# Patient Record
Sex: Male | Born: 1935
Health system: Southern US, Community
[De-identification: ages and names within clinical notes are randomized; demographics above are authoritative.]

## PROBLEM LIST (undated history)

## (undated) DIAGNOSIS — E785 Hyperlipidemia, unspecified: Secondary | ICD-10-CM

## (undated) DIAGNOSIS — K219 Gastro-esophageal reflux disease without esophagitis: Secondary | ICD-10-CM

## (undated) DIAGNOSIS — Z87898 Personal history of other specified conditions: Secondary | ICD-10-CM

## (undated) DIAGNOSIS — S42009A Fracture of unspecified part of unspecified clavicle, initial encounter for closed fracture: Secondary | ICD-10-CM

## (undated) DIAGNOSIS — N529 Male erectile dysfunction, unspecified: Secondary | ICD-10-CM

## (undated) DIAGNOSIS — G459 Transient cerebral ischemic attack, unspecified: Secondary | ICD-10-CM

## (undated) DIAGNOSIS — D696 Thrombocytopenia, unspecified: Secondary | ICD-10-CM

## (undated) DIAGNOSIS — I519 Heart disease, unspecified: Secondary | ICD-10-CM

## (undated) DIAGNOSIS — Z85528 Personal history of other malignant neoplasm of kidney: Secondary | ICD-10-CM

## (undated) DIAGNOSIS — L570 Actinic keratosis: Secondary | ICD-10-CM

## (undated) DIAGNOSIS — K8020 Calculus of gallbladder without cholecystitis without obstruction: Secondary | ICD-10-CM

## (undated) DIAGNOSIS — Z8719 Personal history of other diseases of the digestive system: Secondary | ICD-10-CM

## (undated) DIAGNOSIS — L719 Rosacea, unspecified: Secondary | ICD-10-CM

## (undated) DIAGNOSIS — H409 Unspecified glaucoma: Secondary | ICD-10-CM

## (undated) DIAGNOSIS — N189 Chronic kidney disease, unspecified: Secondary | ICD-10-CM

## (undated) DIAGNOSIS — C801 Malignant (primary) neoplasm, unspecified: Secondary | ICD-10-CM

## (undated) DIAGNOSIS — I517 Cardiomegaly: Secondary | ICD-10-CM

## (undated) DIAGNOSIS — N400 Enlarged prostate without lower urinary tract symptoms: Secondary | ICD-10-CM

## (undated) DIAGNOSIS — M1990 Unspecified osteoarthritis, unspecified site: Secondary | ICD-10-CM

## (undated) DIAGNOSIS — E039 Hypothyroidism, unspecified: Secondary | ICD-10-CM

## (undated) DIAGNOSIS — Z8679 Personal history of other diseases of the circulatory system: Secondary | ICD-10-CM

## (undated) DIAGNOSIS — Z950 Presence of cardiac pacemaker: Secondary | ICD-10-CM

## (undated) DIAGNOSIS — I441 Atrioventricular block, second degree: Secondary | ICD-10-CM

## (undated) DIAGNOSIS — K760 Fatty (change of) liver, not elsewhere classified: Secondary | ICD-10-CM

## (undated) DIAGNOSIS — M79669 Pain in unspecified lower leg: Secondary | ICD-10-CM

## (undated) HISTORY — PX: OTHER SURGICAL HISTORY: SHX169

## (undated) HISTORY — PX: TONSILLECTOMY: SUR1361

## (undated) HISTORY — PX: MOHS SURGERY: SUR867

## (undated) HISTORY — PX: SHOULDER ARTHROSCOPY: SHX128

## (undated) HISTORY — PX: CATARACT EXTRACTION W/ INTRAOCULAR LENS  IMPLANT, BILATERAL: SHX1307

---

## 1988-05-13 HISTORY — PX: LUMBAR LAMINECTOMY: SHX95

## 1999-02-01 ENCOUNTER — Other Ambulatory Visit: Admission: RE | Admit: 1999-02-01 | Discharge: 1999-02-01 | Payer: Self-pay | Admitting: Urology

## 2001-05-13 HISTORY — PX: NEPHRECTOMY: SHX65

## 2001-08-20 ENCOUNTER — Encounter: Payer: Self-pay | Admitting: Internal Medicine

## 2001-08-20 ENCOUNTER — Inpatient Hospital Stay (HOSPITAL_COMMUNITY): Admission: EM | Admit: 2001-08-20 | Discharge: 2001-08-25 | Payer: Self-pay | Admitting: Emergency Medicine

## 2001-08-20 ENCOUNTER — Encounter: Payer: Self-pay | Admitting: Emergency Medicine

## 2001-08-24 ENCOUNTER — Encounter: Payer: Self-pay | Admitting: Internal Medicine

## 2001-09-02 ENCOUNTER — Encounter: Payer: Self-pay | Admitting: Internal Medicine

## 2001-09-02 ENCOUNTER — Ambulatory Visit (HOSPITAL_COMMUNITY): Admission: RE | Admit: 2001-09-02 | Discharge: 2001-09-02 | Payer: Self-pay | Admitting: Internal Medicine

## 2001-09-08 ENCOUNTER — Inpatient Hospital Stay (HOSPITAL_COMMUNITY): Admission: AD | Admit: 2001-09-08 | Discharge: 2001-09-14 | Payer: Self-pay | Admitting: Gastroenterology

## 2001-10-06 ENCOUNTER — Encounter: Payer: Self-pay | Admitting: Urology

## 2001-10-06 ENCOUNTER — Ambulatory Visit (HOSPITAL_COMMUNITY): Admission: RE | Admit: 2001-10-06 | Discharge: 2001-10-06 | Payer: Self-pay | Admitting: Urology

## 2001-10-13 ENCOUNTER — Inpatient Hospital Stay (HOSPITAL_COMMUNITY): Admit: 2001-10-13 | Discharge: 2001-10-19 | Payer: Self-pay | Admitting: Urology

## 2001-11-15 ENCOUNTER — Emergency Department (HOSPITAL_COMMUNITY): Admission: EM | Admit: 2001-11-15 | Discharge: 2001-11-15 | Payer: Self-pay | Admitting: Emergency Medicine

## 2002-04-13 ENCOUNTER — Ambulatory Visit (HOSPITAL_COMMUNITY): Admission: RE | Admit: 2002-04-13 | Discharge: 2002-04-13 | Payer: Self-pay | Admitting: Urology

## 2002-04-13 ENCOUNTER — Encounter: Payer: Self-pay | Admitting: Urology

## 2002-11-26 ENCOUNTER — Encounter: Payer: Self-pay | Admitting: Urology

## 2002-11-26 ENCOUNTER — Ambulatory Visit (HOSPITAL_COMMUNITY): Admission: RE | Admit: 2002-11-26 | Discharge: 2002-11-26 | Payer: Self-pay | Admitting: Urology

## 2002-12-22 ENCOUNTER — Ambulatory Visit (HOSPITAL_COMMUNITY): Admission: RE | Admit: 2002-12-22 | Discharge: 2002-12-22 | Payer: Self-pay | Admitting: Gastroenterology

## 2004-01-04 ENCOUNTER — Ambulatory Visit (HOSPITAL_COMMUNITY): Admission: RE | Admit: 2004-01-04 | Discharge: 2004-01-04 | Payer: Self-pay | Admitting: Nephrology

## 2004-05-15 ENCOUNTER — Ambulatory Visit (HOSPITAL_COMMUNITY): Admission: RE | Admit: 2004-05-15 | Discharge: 2004-05-15 | Payer: Self-pay | Admitting: Urology

## 2004-10-19 ENCOUNTER — Ambulatory Visit (HOSPITAL_COMMUNITY): Admission: RE | Admit: 2004-10-19 | Discharge: 2004-10-19 | Payer: Self-pay | Admitting: Urology

## 2005-04-23 ENCOUNTER — Ambulatory Visit (HOSPITAL_COMMUNITY): Admission: RE | Admit: 2005-04-23 | Discharge: 2005-04-23 | Payer: Self-pay | Admitting: Urology

## 2009-05-13 HISTORY — PX: TRABECULECTOMY: SHX107

## 2010-09-27 ENCOUNTER — Other Ambulatory Visit: Payer: Self-pay | Admitting: Dermatology

## 2011-04-08 ENCOUNTER — Other Ambulatory Visit: Payer: Self-pay | Admitting: Dermatology

## 2011-05-30 ENCOUNTER — Other Ambulatory Visit: Payer: Self-pay | Admitting: Gastroenterology

## 2011-12-02 ENCOUNTER — Encounter (HOSPITAL_BASED_OUTPATIENT_CLINIC_OR_DEPARTMENT_OTHER): Payer: Self-pay | Admitting: *Deleted

## 2011-12-02 ENCOUNTER — Other Ambulatory Visit: Payer: Self-pay | Admitting: Orthopedic Surgery

## 2011-12-02 NOTE — Progress Notes (Signed)
Pt quit smoking mid 20s-no resp problems, no htn, Is on flomax now, but had to be cathed post op last surgery. To bring all meds and overnight bag

## 2011-12-03 ENCOUNTER — Encounter (HOSPITAL_BASED_OUTPATIENT_CLINIC_OR_DEPARTMENT_OTHER): Payer: Self-pay | Admitting: Anesthesiology

## 2011-12-03 ENCOUNTER — Ambulatory Visit (HOSPITAL_BASED_OUTPATIENT_CLINIC_OR_DEPARTMENT_OTHER): Payer: Medicare Other | Admitting: Anesthesiology

## 2011-12-03 ENCOUNTER — Ambulatory Visit (HOSPITAL_BASED_OUTPATIENT_CLINIC_OR_DEPARTMENT_OTHER)
Admission: RE | Admit: 2011-12-03 | Discharge: 2011-12-04 | Disposition: A | Discharge status: Home / Self Care | Payer: Medicare Other | Source: Ambulatory Visit | Attending: Orthopedic Surgery | Admitting: Orthopedic Surgery

## 2011-12-03 ENCOUNTER — Ambulatory Visit (HOSPITAL_COMMUNITY): Payer: Medicare Other

## 2011-12-03 ENCOUNTER — Encounter (HOSPITAL_BASED_OUTPATIENT_CLINIC_OR_DEPARTMENT_OTHER)
Admission: RE | Disposition: A | Discharge status: Home / Self Care | Payer: Self-pay | Source: Ambulatory Visit | Attending: Orthopedic Surgery

## 2011-12-03 ENCOUNTER — Encounter (HOSPITAL_BASED_OUTPATIENT_CLINIC_OR_DEPARTMENT_OTHER): Payer: Self-pay

## 2011-12-03 DIAGNOSIS — S42009A Fracture of unspecified part of unspecified clavicle, initial encounter for closed fracture: Secondary | ICD-10-CM

## 2011-12-03 DIAGNOSIS — S4350XA Sprain of unspecified acromioclavicular joint, initial encounter: Secondary | ICD-10-CM | POA: Insufficient documentation

## 2011-12-03 DIAGNOSIS — Y929 Unspecified place or not applicable: Secondary | ICD-10-CM | POA: Insufficient documentation

## 2011-12-03 DIAGNOSIS — S42033A Displaced fracture of lateral end of unspecified clavicle, initial encounter for closed fracture: Secondary | ICD-10-CM | POA: Insufficient documentation

## 2011-12-03 DIAGNOSIS — W19XXXA Unspecified fall, initial encounter: Secondary | ICD-10-CM | POA: Insufficient documentation

## 2011-12-03 HISTORY — DX: Unspecified osteoarthritis, unspecified site: M19.90

## 2011-12-03 HISTORY — PX: ORIF CLAVICULAR FRACTURE: SHX5055

## 2011-12-03 HISTORY — DX: Gastro-esophageal reflux disease without esophagitis: K21.9

## 2011-12-03 HISTORY — DX: Unspecified glaucoma: H40.9

## 2011-12-03 LAB — POCT HEMOGLOBIN-HEMACUE: Hemoglobin: 14.8 g/dL (ref 13.0–17.0)

## 2011-12-03 SURGERY — OPEN REDUCTION INTERNAL FIXATION (ORIF) CLAVICULAR FRACTURE
Anesthesia: General | Site: Shoulder | Laterality: Right | Wound class: Clean

## 2011-12-03 MED ORDER — PANTOPRAZOLE SODIUM 40 MG PO TBEC
40.0000 mg | DELAYED_RELEASE_TABLET | Freq: Every day | ORAL | Status: DC
  Administered 2011-12-03: 40 mg via ORAL

## 2011-12-03 MED ORDER — HYDROMORPHONE HCL PF 1 MG/ML IJ SOLN: 0.2500 mg | INTRAMUSCULAR | Status: DC | PRN

## 2011-12-03 MED ORDER — DORZOLAMIDE HCL-TIMOLOL MAL 2-0.5 % OP SOLN
1.0000 [drp] | Freq: Two times a day (BID) | OPHTHALMIC | Status: DC
  Administered 2011-12-03 (×2): 1 [drp] via OPHTHALMIC

## 2011-12-03 MED ORDER — BUPIVACAINE-EPINEPHRINE PF 0.5-1:200000 % IJ SOLN
INTRAMUSCULAR | Status: DC | PRN
  Administered 2011-12-03: 20 mL

## 2011-12-03 MED ORDER — FINASTERIDE 5 MG PO TABS
5.0000 mg | ORAL_TABLET | Freq: Every day | ORAL | Status: DC
  Administered 2011-12-03: 5 mg via ORAL

## 2011-12-03 MED ORDER — KCL IN DEXTROSE-NACL 20-5-0.45 MEQ/L-%-% IV SOLN
INTRAVENOUS | Status: DC
  Administered 2011-12-03: 14:00:00 via INTRAVENOUS

## 2011-12-03 MED ORDER — DEXAMETHASONE SODIUM PHOSPHATE 4 MG/ML IJ SOLN
INTRAMUSCULAR | Status: DC | PRN
  Administered 2011-12-03: 10 mg via INTRAVENOUS

## 2011-12-03 MED ORDER — EPHEDRINE SULFATE 50 MG/ML IJ SOLN
INTRAMUSCULAR | Status: DC | PRN
  Administered 2011-12-03 (×4): 10 mg via INTRAVENOUS

## 2011-12-03 MED ORDER — PROPOFOL 10 MG/ML IV EMUL
INTRAVENOUS | Status: DC | PRN
  Administered 2011-12-03: 200 mg via INTRAVENOUS

## 2011-12-03 MED ORDER — 0.9 % SODIUM CHLORIDE (POUR BTL) OPTIME
TOPICAL | Status: DC | PRN
  Administered 2011-12-03: 300 mL

## 2011-12-03 MED ORDER — FENTANYL CITRATE 0.05 MG/ML IJ SOLN
INTRAMUSCULAR | Status: DC | PRN
  Administered 2011-12-03: 50 ug via INTRAVENOUS
  Administered 2011-12-03: 100 ug via INTRAVENOUS

## 2011-12-03 MED ORDER — DIPHENHYDRAMINE HCL 12.5 MG/5ML PO ELIX: 12.5000 mg | ORAL_SOLUTION | ORAL | Status: DC | PRN

## 2011-12-03 MED ORDER — LIDOCAINE HCL (CARDIAC) 20 MG/ML IV SOLN
INTRAVENOUS | Status: DC | PRN
  Administered 2011-12-03: 50 mg via INTRAVENOUS

## 2011-12-03 MED ORDER — METOCLOPRAMIDE HCL 5 MG/ML IJ SOLN: 5.0000 mg | Freq: Three times a day (TID) | INTRAMUSCULAR | Status: DC | PRN

## 2011-12-03 MED ORDER — LACTATED RINGERS IV SOLN
INTRAVENOUS | Status: DC
  Administered 2011-12-03 (×2): via INTRAVENOUS

## 2011-12-03 MED ORDER — MIDAZOLAM HCL 2 MG/2ML IJ SOLN: 0.5000 mg | Freq: Once | INTRAMUSCULAR | Status: DC | PRN

## 2011-12-03 MED ORDER — CEFAZOLIN SODIUM-DEXTROSE 2-3 GM-% IV SOLR
2.0000 g | Freq: Once | INTRAVENOUS | Status: AC
  Administered 2011-12-03: 2 g via INTRAVENOUS

## 2011-12-03 MED ORDER — SIMVASTATIN 10 MG PO TABS: 10.0000 mg | ORAL_TABLET | ORAL | Status: DC

## 2011-12-03 MED ORDER — GLYCOPYRROLATE 0.2 MG/ML IJ SOLN
INTRAMUSCULAR | Status: DC | PRN
  Administered 2011-12-03: 0.4 mg via INTRAVENOUS

## 2011-12-03 MED ORDER — ONDANSETRON HCL 4 MG/2ML IJ SOLN
INTRAMUSCULAR | Status: DC | PRN
  Administered 2011-12-03: 4 mg via INTRAVENOUS

## 2011-12-03 MED ORDER — CEFAZOLIN SODIUM-DEXTROSE 2-3 GM-% IV SOLR
2.0000 g | Freq: Four times a day (QID) | INTRAVENOUS | Status: AC
  Administered 2011-12-03 – 2011-12-04 (×3): 2 g via INTRAVENOUS

## 2011-12-03 MED ORDER — AZATHIOPRINE 50 MG PO TABS: 50.0000 mg | ORAL_TABLET | Freq: Every day | ORAL | Status: DC

## 2011-12-03 MED ORDER — MORPHINE SULFATE 2 MG/ML IJ SOLN: 2.0000 mg | INTRAMUSCULAR | Status: DC | PRN

## 2011-12-03 MED ORDER — ONDANSETRON HCL 4 MG PO TABS: 4.0000 mg | ORAL_TABLET | Freq: Four times a day (QID) | ORAL | Status: DC | PRN

## 2011-12-03 MED ORDER — TAMSULOSIN HCL 0.4 MG PO CAPS
0.4000 mg | ORAL_CAPSULE | Freq: Every day | ORAL | Status: DC
  Administered 2011-12-03: 0.4 mg via ORAL

## 2011-12-03 MED ORDER — LEVOTHYROXINE SODIUM 25 MCG PO TABS: 25.0000 ug | ORAL_TABLET | Freq: Every day | ORAL | Status: DC

## 2011-12-03 MED ORDER — VITAMIN D3 25 MCG (1000 UNIT) PO TABS
1000.0000 [IU] | ORAL_TABLET | Freq: Every day | ORAL | Status: DC
  Administered 2011-12-03: 1000 [IU] via ORAL

## 2011-12-03 MED ORDER — PROMETHAZINE HCL 25 MG/ML IJ SOLN: 6.2500 mg | INTRAMUSCULAR | Status: DC | PRN

## 2011-12-03 MED ORDER — MEPERIDINE HCL 25 MG/ML IJ SOLN: 6.2500 mg | INTRAMUSCULAR | Status: DC | PRN

## 2011-12-03 MED ORDER — CALCIUM CARBONATE 600 MG PO TABS: 600.0000 mg | ORAL_TABLET | Freq: Two times a day (BID) | ORAL | Status: DC

## 2011-12-03 MED ORDER — TRAVOPROST (BAK FREE) 0.004 % OP SOLN: 1.0000 [drp] | Freq: Every day | OPHTHALMIC | Status: DC

## 2011-12-03 MED ORDER — POVIDONE-IODINE 7.5 % EX SOLN: Freq: Once | CUTANEOUS | Status: DC

## 2011-12-03 MED ORDER — ASPIRIN 81 MG PO TABS: 81.0000 mg | ORAL_TABLET | Freq: Every day | ORAL | Status: DC

## 2011-12-03 MED ORDER — SUCCINYLCHOLINE CHLORIDE 20 MG/ML IJ SOLN
INTRAMUSCULAR | Status: DC | PRN
  Administered 2011-12-03: 100 mg via INTRAVENOUS

## 2011-12-03 MED ORDER — DOCUSATE SODIUM 100 MG PO CAPS
100.0000 mg | ORAL_CAPSULE | Freq: Two times a day (BID) | ORAL | Status: DC
  Administered 2011-12-03: 100 mg via ORAL

## 2011-12-03 MED ORDER — ONDANSETRON HCL 4 MG/2ML IJ SOLN: 4.0000 mg | Freq: Four times a day (QID) | INTRAMUSCULAR | Status: DC | PRN

## 2011-12-03 MED ORDER — MESALAMINE 400 MG PO TBEC: 400.0000 mg | DELAYED_RELEASE_TABLET | Freq: Three times a day (TID) | ORAL | Status: DC

## 2011-12-03 MED ORDER — CEFAZOLIN SODIUM-DEXTROSE 2-3 GM-% IV SOLR: 2.0000 g | Freq: Four times a day (QID) | INTRAVENOUS | Status: DC

## 2011-12-03 MED ORDER — OXYCODONE-ACETAMINOPHEN 5-325 MG PO TABS
1.0000 | ORAL_TABLET | ORAL | Status: DC | PRN
  Administered 2011-12-04 (×2): 2 via ORAL

## 2011-12-03 MED ORDER — ZOLPIDEM TARTRATE 5 MG PO TABS
5.0000 mg | ORAL_TABLET | Freq: Every evening | ORAL | Status: DC | PRN
Start: 2011-12-03 — End: 2011-12-04

## 2011-12-03 MED ORDER — METOCLOPRAMIDE HCL 5 MG PO TABS: 5.0000 mg | ORAL_TABLET | Freq: Three times a day (TID) | ORAL | Status: DC | PRN

## 2011-12-03 SURGICAL SUPPLY — 67 items
APL SKNCLS STERI-STRIP NONHPOA (GAUZE/BANDAGES/DRESSINGS) ×1
BENZOIN TINCTURE PRP APPL 2/3 (GAUZE/BANDAGES/DRESSINGS) ×1 IMPLANT
BIT DRILL 2.0 LNG QUCK RELEASE (BIT) IMPLANT
BIT DRILL 2.8X5 QR DISP (BIT) ×1 IMPLANT
BLADE SURG 15 STRL LF DISP TIS (BLADE) ×1 IMPLANT
BLADE SURG 15 STRL SS (BLADE) ×2
BLADE SURG ROTATE 9660 (MISCELLANEOUS) IMPLANT
CANISTER SUCTION 1200CC (MISCELLANEOUS) ×1 IMPLANT
CANISTER SUCTION 2500CC (MISCELLANEOUS) IMPLANT
CHLORAPREP W/TINT 26ML (MISCELLANEOUS) ×2 IMPLANT
CLOTH BEACON ORANGE TIMEOUT ST (SAFETY) ×2 IMPLANT
DECANTER SPIKE VIAL GLASS SM (MISCELLANEOUS) IMPLANT
DRAPE C-ARM 42X72 X-RAY (DRAPES) ×2 IMPLANT
DRAPE INCISE IOBAN 66X45 STRL (DRAPES) ×2 IMPLANT
DRAPE SURG 17X23 STRL (DRAPES) ×1 IMPLANT
DRAPE U-SHAPE 47X51 STRL (DRAPES) ×3 IMPLANT
DRAPE U-SHAPE 76X120 STRL (DRAPES) ×4 IMPLANT
DRILL 2.0 LNG QUICK RELEASE (BIT) ×2
ELECT REM PT RETURN 9FT ADLT (ELECTROSURGICAL) ×2
ELECTRODE REM PT RTRN 9FT ADLT (ELECTROSURGICAL) ×1 IMPLANT
GLOVE BIO SURGEON STRL SZ 6.5 (GLOVE) ×1 IMPLANT
GLOVE BIO SURGEON STRL SZ7.5 (GLOVE) ×2 IMPLANT
GLOVE BIOGEL PI IND STRL 7.5 (GLOVE) IMPLANT
GLOVE BIOGEL PI IND STRL 8 (GLOVE) ×1 IMPLANT
GLOVE BIOGEL PI INDICATOR 7.5 (GLOVE) ×1
GLOVE BIOGEL PI INDICATOR 8 (GLOVE) ×1
GLOVE INDICATOR 7.0 STRL GRN (GLOVE) ×4 IMPLANT
GOWN PREVENTION PLUS XLARGE (GOWN DISPOSABLE) ×5 IMPLANT
GUIDEWIRE ORTHO .059X5 (WIRE) ×1 IMPLANT
HEWSON SUTURE RETRIEVER ×1 IMPLANT
NS IRRIG 1000ML POUR BTL (IV SOLUTION) ×1 IMPLANT
PACK ARTHROSCOPY DSU (CUSTOM PROCEDURE TRAY) ×2 IMPLANT
PACK BASIN DAY SURGERY FS (CUSTOM PROCEDURE TRAY) ×2 IMPLANT
PENCIL BUTTON HOLSTER BLD 10FT (ELECTRODE) ×2 IMPLANT
PLATE DIST CLAV 2.3 16H RT (Plate) ×1 IMPLANT
SCREW 3.5MMX14.0MM (Screw) ×1 IMPLANT
SCREW BN FT 16X2.3XLCK HEX CRT (Screw) IMPLANT
SCREW CORT 3.5X14 (Screw) ×2 IMPLANT
SCREW CORT FT 18X2.3XLCK HEX (Screw) IMPLANT
SCREW CORTICAL LOCKING 2.3X16M (Screw) ×2 IMPLANT
SCREW CORTICAL LOCKING 2.3X18M (Screw) ×6 IMPLANT
SCREW NON TOGG 2.3X18MM (Screw) ×1 IMPLANT
SLEEVE SCD COMPRESS KNEE MED (MISCELLANEOUS) ×1 IMPLANT
SLING ARM FOAM STRAP LRG (SOFTGOODS) IMPLANT
SLING ARM FOAM STRAP MED (SOFTGOODS) IMPLANT
SLING ARM FOAM STRAP XLG (SOFTGOODS) IMPLANT
SLING ARM IMMOBILIZER MED (SOFTGOODS) IMPLANT
SPONGE GAUZE 4X4 12PLY (GAUZE/BANDAGES/DRESSINGS) ×2 IMPLANT
SPONGE LAP 18X18 X RAY DECT (DISPOSABLE) ×1 IMPLANT
SPONGE LAP 4X18 X RAY DECT (DISPOSABLE) ×2 IMPLANT
STRIP CLOSURE SKIN 1/2X4 (GAUZE/BANDAGES/DRESSINGS) ×1 IMPLANT
SUCTION FRAZIER TIP 10 FR DISP (SUCTIONS) ×1 IMPLANT
SUPPORT WRAP ARM LG (MISCELLANEOUS) ×1 IMPLANT
SUT ETHILON 4 0 PS 2 18 (SUTURE) IMPLANT
SUT MNCRL AB 4-0 PS2 18 (SUTURE) ×2 IMPLANT
SUT VIC AB 0 CT1 27 (SUTURE) ×2
SUT VIC AB 0 CT1 27XBRD ANBCTR (SUTURE) ×1 IMPLANT
SUT VIC AB 1 CT1 27 (SUTURE) ×4
SUT VIC AB 1 CT1 27XBRD ANBCTR (SUTURE) IMPLANT
SUT VIC AB 2-0 CT1 27 (SUTURE) ×2
SUT VIC AB 2-0 CT1 TAPERPNT 27 (SUTURE) ×1 IMPLANT
SYR BULB 3OZ (MISCELLANEOUS) ×2 IMPLANT
TAPE FIBER 2MM 7IN #2 BLUE (SUTURE) ×2 IMPLANT
TOWEL OR 17X24 6PK STRL BLUE (TOWEL DISPOSABLE) ×2 IMPLANT
TOWEL OR NON WOVEN STRL DISP B (DISPOSABLE) ×2 IMPLANT
WATER STERILE IRR 1000ML POUR (IV SOLUTION) ×1 IMPLANT
YANKAUER SUCT BULB TIP NO VENT (SUCTIONS) ×1 IMPLANT

## 2011-12-03 NOTE — Anesthesia Postprocedure Evaluation (Signed)
  Anesthesia Post-op Note  Patient: Joseph Hanna  Procedure(s) Performed: Procedure(s) (LRB): OPEN REDUCTION INTERNAL FIXATION (ORIF) CLAVICULAR FRACTURE (Right)  Patient Location: PACU  Anesthesia Type: General  Level of Consciousness: awake, alert  and oriented  Airway and Oxygen Therapy: Patient Spontanous Breathing and Patient connected to nasal cannula oxygen  Post-op Pain: mild  Post-op Assessment: Post-op Vital signs reviewed, Patient's Cardiovascular Status Stable, Respiratory Function Stable, Patent Airway, No signs of Nausea or vomiting and Pain level controlled  Post-op Vital Signs: Reviewed and stable  Complications: No apparent anesthesia complications

## 2011-12-03 NOTE — H&P (Signed)
Joseph Hanna is an 78 y.o. male.   Chief Complaint: R shoulder pain  HPI: R distal clavicle fracture after fall yesterday.  Past Medical History  Diagnosis Date  . Complication of anesthesia     had to be catherized post shoulder 2012-now on flomax  . Hyperlipemia   . Ulcerative colitis   . Glaucoma   . BPH (benign prostatic hyperplasia)   . Renal cancer   . GERD (gastroesophageal reflux disease)   . Arthritis   . Wears glasses     Past Surgical History  Procedure Date  . Tonsillectomy   . Back surgery 1990    lumb lam   . Total nephrectomy 2003    left  . Trabeculectomy     left eye  . Shoulder arthroscopy 2012    left  . Shoulder arthroscopy     right  . Colonoscopy     History reviewed. No pertinent family history. Social History:  reports that he quit smoking about 49 years ago. He does not have any smokeless tobacco history on file. He reports that he drinks alcohol. He reports that he does not use illicit drugs.  Allergies:  Allergies  Allergen Reactions  . Itraconazole Rash  . Lipitor (Atorvastatin) Rash    Medications Prior to Admission  Medication Sig Dispense Refill  . aspirin 81 MG tablet Take 81 mg by mouth daily.      [name] azaTHIOprine (IMURAN) 50 MG tablet Take 50 mg by mouth daily. Takes 1  1/2      . calcium carbonate (OS-CAL) 600 MG TABS Take 600 mg by mouth 2 (two) times daily with a meal.      . cholecalciferol (VITAMIN D) 1000 UNITS tablet Take 1,000 Units by mouth daily. Takes 2      . dorzolamide-timolol (COSOPT) 22.3-6.8 MG/ML ophthalmic solution Place 1 drop into both eyes 2 (two) times daily.      . finasteride (PROSCAR) 5 MG tablet Take 5 mg by mouth daily.      . fish oil-omega-3 fatty acids 1000 MG capsule Take 2 g by mouth daily.      [name] levothyroxine (SYNTHROID, LEVOTHROID) 25 MCG tablet Take 25 mcg by mouth daily.      . mesalamine (ASACOL) 400 MG EC tablet Take 400 mg by mouth 3 (three) times daily.      [name] omeprazole (PRILOSEC) 20  MG capsule Take 20 mg by mouth daily.      . simvastatin (ZOCOR) 10 MG tablet Take 10 mg by mouth every other day.      . Tamsulosin HCl (FLOMAX) 0.4 MG CAPS Take by mouth daily after supper.      . Travoprost, BAK Free, (TRAVATAN) 0.004 % SOLN ophthalmic solution Place 1 drop into both eyes at bedtime.        No results found for this or any previous visit (from the past 48 hour(s)). No results found.  Review of Systems  All other systems reviewed and are negative.    Blood pressure 163/77, pulse 47, temperature 97.6 F (36.4 C), temperature source Oral, resp. rate 20, height 6' 1"  (1.854 m), weight 83.915 kg (185 lb), SpO2 98.00%. Physical Exam  Constitutional: He is oriented to person, place, and time. He appears well-developed and well-nourished.  HENT:  Head: Atraumatic.  Eyes: EOM are normal.  Cardiovascular: Intact distal pulses.   Respiratory: Effort normal.  Musculoskeletal:       Right shoulder: He exhibits decreased range of motion,  tenderness, swelling, crepitus and deformity.  Neurological: He is alert and oriented to person, place, and time.  Skin: Skin is warm and dry.  Psychiatric: He has a normal mood and affect.     Assessment/Plan R distal clavicle fracture with disruption of cc ligaments, will need ORIF with reinforcement of CC ligaments Risks / benefits of surgery discussed Consent on chart  NPO for OR Preop antibiotics   Anushree Dorsi WILLIAM 12/03/2011, 10:30 AM

## 2011-12-03 NOTE — Anesthesia Preprocedure Evaluation (Signed)
Anesthesia Evaluation  Patient identified by MRN, date of birth, ID band Patient awake    Reviewed: Allergy & Precautions, H&P , NPO status , Patient's Chart, lab work & pertinent test results  Airway Mallampati: II TM Distance: >3 FB Neck ROM: Full    Dental No notable dental hx. (+) Teeth Intact, Caps and Dental Advisory Given   Pulmonary neg pulmonary ROS,  breath sounds clear to auscultation  Pulmonary exam normal       Cardiovascular negative cardio ROS  Rhythm:Regular Rate:Normal     Neuro/Psych negative neurological ROS     GI/Hepatic Neg liver ROS, GERD-  Medicated and Controlled,  Endo/Other  negative endocrine ROS  Renal/GU S/p nephrectomy for renal ca   BPH    Musculoskeletal   Abdominal   Peds  Hematology negative hematology ROS (+)   Anesthesia Other Findings   Reproductive/Obstetrics                           Anesthesia Physical Anesthesia Plan  ASA: II  Anesthesia Plan: General   Post-op Pain Management:    Induction: Intravenous  Airway Management Planned: Oral ETT  Additional Equipment:   Intra-op Plan:   Post-operative Plan: Extubation in OR  Informed Consent: I have reviewed the patients History and Physical, chart, labs and discussed the procedure including the risks, benefits and alternatives for the proposed anesthesia with the patient or authorized representative who has indicated his/her understanding and acceptance.   Dental advisory given  Plan Discussed with: CRNA and Surgeon  Anesthesia Plan Comments: (Plan routine monitors, GETA   Patient and Chandler decline ISB)        Anesthesia Quick Evaluation

## 2011-12-03 NOTE — Op Note (Addendum)
Procedure(s): OPEN REDUCTION INTERNAL FIXATION (ORIF) CLAVICULAR FRACTURE Procedure Note  Joseph Hanna male 78 y.o. 12/03/2011  Procedure(s) and Anesthesia Type:    * #1 OPEN REDUCTION INTERNAL FIXATION (ORIF) Right distal CLAVICULAR FRACTURE - General #2 Reconstruction coracoclavicular ligaments (AC joint)   Post op Dx:  #1 R distal clavicle fx #2 R coracoclavicular (AC) disruption  Surgeon(s) and Role:    * [name], MD - Primary   Indications:  78 y.o. male s/p fall yesterday with right distal clavicle fracture. Indicated for surgery to promote anatomic restoration anatomy, decrease risk of nonunion, improve functional outcome and avoid skin complications.     Surgeon: [name]   Assistants:  Nehemiah Massed PA-C (Blair's assistance was essential throughout the case for retraction and positioning), Chelsea PA-S.  Anesthesia: General endotracheal anesthesia    Procedure Detail  OPEN REDUCTION INTERNAL FIXATION (ORIF) CLAVICULAR FRACTURE  Findings: Comminuted distal clavicle fracture with a separate inferior fragment which remained attached to the coracoclavicular ligaments. The fracture was fixed with a combination of a distal locking plate with 3 medial screws and 5 locking distal screws and 2 fiber tapes which were passed beneath the coracoid and up through drill holes in both fragments of the bone and tied over top of the plate. Distal fixation was moderate but the overall construct was solid.  Estimated Blood Loss:  less than 50 mL         Drains: none  Blood Given: none         Specimens: none        Complications:  * No complications entered in OR log *         Disposition: PACU - hemodynamically stable.         Condition: stable    Procedure:   INDICATIONS FOR SURGERY: The patient suffered a displaced clavicle fracture and is  indicated for surgical treatment to promote anatomic alignment, prevent nonunion, and prevent  deformity and subsequent weakness and disability. We have had an extensive discussion of risks, benefits, and alternatives of procedure including, but not limited to  risk of bleeding, infection, damage to neurovascular structures, risk of  nonunion, malunion, potential need for future hardware removal or  possible revision surgery. The patient understands this and wishes to proceed with surgery.   DESCRIPTION OF PROCEDURE: The patient was identified in preoperative  holding area where I personally marked the operative site after  verifying site, side, and procedure with the patient. The patient was taken back  to the operating room where general anesthesia was induced without  complication and was placed in the beach-chair position with the back   elevated about 40 degrees and all extremities carefully padded and  positioned. The neck was turned very slightly away from the operative field  to assist in exposure. The right upper extremity was then prepped and  draped in a standard sterile fashion. The appropriate time-out  procedure was carried out. The patient did receive IV antibiotics  within 30 minutes of incision.  An incision was made in [employer] centered over the fracture site. Dissection was carried down through subcutaneous tissues and medial and lateral skin flaps were elevated.  The deltotrapezial fascia was then opened over the clavicle and the  medial and lateral fracture fragments were carefully exposed, taking great care to protect underlying neurovascular structures. The distal fragment was noted to be very small dorsally only about 15 mm of dorsal bone to work with and there was a  split both in the axial plane and sagittal plane. The dorsal coracoid was exposed carefully and careful dissection was made first laterally and then medially over the edge of the coracoid. Careful dissection was then carried out subperiosteally beneath the coracoid and a Cooley clamp was passed from  medial to lateral and used to pass a 0 Vicryl suture. The Vicryl suture was then used as a shuttle to pass 2 fiber tapes. These were clamped for later use. The inferior fragment of bone was drilled for the opportunity and they were passed through the inferior fragment. The fracture was then held reduced. The plate was positioned on the bone using fluoroscopic imaging to verify position. Once the appropriate size plate was selected one screw was fixed medially bicortically, nonlocking.  The holes immediately adjacent to the fracture medially were then drilled then used to pass the 4 strands of the fiber tapes. Once the fracture was felt to be adequately reduced and the elbow was held up while pressing downward pressure on the medial segment of the clavicle the fiber tapes were both tied. This held the coracoclavicular interval reduced. The distal locking screws were then drilled measured and filled with the appropriate size screws. The plate was lifted slightly off of the distal segment. This was felt to be okay secondary to the use of locking screws. Screws did get fixation in both superior and inferior fragment. The remaining medial holes were then drilled measured and filled with appropriate size screws. One locking screw was placed. Final fluoroscopic imaging demonstrated Near-anatomic reduction of fracture with significant comminution. The plate was in good position.  The wound was copiously irrigated with normal saline and the deltotrapezial fascia was  then carefully closed over the construct with #0 vicryl sutures in  interrupted fashion. The skin was then closed with 2-0 Vicryl in a deep  dermal layer, 4-0 Monocryl for skin closure. Steri-Strips were applied.  20 mL of 0.25% Marcaine with epinephrine were infiltrated for  postoperative pain. Sterile dressings were applied including a medium  Mepilex dressing. The patient was then allowed to awaken from general  anesthesia, placed in a sling,  transferred to stretcher and taken to the  recovery room in stable condition.   POSTOPERATIVE PLAN: He will be kept overnight for pain control and  antibiotics, and will likely be discharged in the morning in a sling.  He will remain in the sling for about 4 weeks postoperatively with the  elbow, wrist, and hand motion only, and then will advance his shoulder  motion once there is some healing seen on x-ray.

## 2011-12-03 NOTE — Transfer of Care (Signed)
Immediate Anesthesia Transfer of Care Note  Patient: Joseph Hanna  Procedure(s) Performed: Procedure(s) (LRB): OPEN REDUCTION INTERNAL FIXATION (ORIF) CLAVICULAR FRACTURE (Right)  Patient Location: PACU  Anesthesia Type: General  Level of Consciousness: awake, alert  and oriented  Airway & Oxygen Therapy: Patient Spontanous Breathing and Patient connected to face mask oxygen  Post-op Assessment: Report given to PACU RN and Post -op Vital signs reviewed and stable  Post vital signs: Reviewed and stable  Complications: No apparent anesthesia complications

## 2011-12-03 NOTE — Anesthesia Procedure Notes (Signed)
Procedure Name: Intubation Performed by: Zenia Resides D Pre-anesthesia Checklist: Patient identified, Patient being monitored, Emergency Drugs available, Timeout performed and Suction available Patient Re-evaluated:Patient Re-evaluated prior to inductionOxygen Delivery Method: Circle system utilized Preoxygenation: Pre-oxygenation with 100% oxygen Intubation Type: IV induction Ventilation: Mask ventilation without difficulty Laryngoscope Size: Miller and 2 Grade View: Grade I Tube type: Oral Tube size: 8.0 mm Number of attempts: 1 Airway Equipment and Method: Stylet Placement Confirmation: ETT inserted through vocal cords under direct vision,  breath sounds checked- equal and bilateral and positive ETCO2 Secured at: 22 cm Tube secured with: Tape Dental Injury: Teeth and Oropharynx as per pre-operative assessment

## 2011-12-04 NOTE — Progress Notes (Signed)
PATIENT ID: Joseph Hanna   1 Day Post-Op Procedure(s) (LRB): OPEN REDUCTION INTERNAL FIXATION (ORIF) CLAVICULAR FRACTURE (Right)  Subjective: Pain well controlled. No c/o  Objective:  Filed Vitals:   12/04/11 0530  BP: 130/79  Pulse: 63  Temp: 98.1 F (36.7 C)  Resp: 18     R shoulder dressing C/d/i NVID  Labs:   Basename 12/03/11 1054  HGB 14.8  No results found for this basename: WBC:2,RBC:2,HCT:2,PLT:2 in the last 72 hoursNo results found for this basename: NA:2,K:2,CL:2,CO2:2,BUN:2,CREATININE:2,GLUCOSE:2,CALCIUM:2 in the last 72 hours  Assessment and Plan:Doing well D/c home today F/u 10 days

## 2011-12-06 ENCOUNTER — Encounter (HOSPITAL_BASED_OUTPATIENT_CLINIC_OR_DEPARTMENT_OTHER): Payer: Self-pay | Admitting: Orthopedic Surgery

## 2012-05-19 ENCOUNTER — Other Ambulatory Visit: Payer: Self-pay | Admitting: Dermatology

## 2012-06-17 ENCOUNTER — Other Ambulatory Visit: Payer: Self-pay | Admitting: Dermatology

## 2012-11-20 ENCOUNTER — Other Ambulatory Visit: Payer: Self-pay | Admitting: Dermatology

## 2013-06-03 ENCOUNTER — Other Ambulatory Visit: Payer: Self-pay | Admitting: Gastroenterology

## 2013-06-13 HISTORY — PX: COLONOSCOPY: SHX174

## 2013-06-24 ENCOUNTER — Other Ambulatory Visit: Payer: Self-pay | Admitting: Dermatology

## 2013-07-30 ENCOUNTER — Encounter (HOSPITAL_BASED_OUTPATIENT_CLINIC_OR_DEPARTMENT_OTHER): Payer: Self-pay | Admitting: *Deleted

## 2013-08-02 ENCOUNTER — Encounter (HOSPITAL_BASED_OUTPATIENT_CLINIC_OR_DEPARTMENT_OTHER): Payer: Self-pay | Admitting: *Deleted

## 2013-08-02 ENCOUNTER — Other Ambulatory Visit: Payer: Self-pay | Admitting: Urology

## 2013-08-02 NOTE — Progress Notes (Signed)
NPO AFTER MN. ARRIVE AT 0600. NEEDS HG. WILL TAKE SYNTHROID AM DOS W/ SIPS OF WATER. 

## 2013-08-09 ENCOUNTER — Encounter (HOSPITAL_BASED_OUTPATIENT_CLINIC_OR_DEPARTMENT_OTHER): Payer: Self-pay | Admitting: Anesthesiology

## 2013-08-09 NOTE — H&P (Signed)
History of Present Illness                    F/u -    BPH  H/o post op AUR. He had (R) shoulder surgery 09/13/10 and was unable to void following the procedure. PVR 09/14/10 was 968ml but 1361ml was obtained at catheterization. He his catheter was removed after successful voiding trial 09/17/10. Also has retention in 2003.     He was on maximal medical therapy, but he has now been off Flomax for many years and just Proscar alone over that period of time. He was started on Rapaflo 8mg  po qhs at May 2012.   -Dec 2012 PVR Dec 2012 - 44 mL; TRUS prostate - 130 g prostate, median lobe present accounting for large volume (6+ cm urethral length). Cystoscopy normal apart from trilobar hypertrophy and an enlarged intravesical median lobe.  -Feb 2013 restarted tamsulosin  -Feb 2014 stable on combo  -Feb 2015 on combo tx    2-PCa screening  PSA was elevated in the 5 range prior to the finasteride.   -Nov 2012 PSA 2.1  -Feb 2014 PSA 1.77 , nl DRE  -Feb 2015 PSa 1.91, nl DRE    3-impotence-  Feb 2014 on Viagra    4-renal cell ca -  Duplex Doppler ultrasound was done of his right kidney June 2011 in the right kidney was normal in size, symmetric in shape and had a normal appearing right renal artery.    He is s/p left radical nephrectomy for RCC in 2003. 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 CT A/P with IV and po contrast negative May 2010. Sept 2012 BUN 21, Cr 1.2.       Mar 2015   Interval Hx  He returns to review UDS.     Mar 2015 urodynamics-I reviewed the report and all the images. Normal capacity and sensation. Bladder was unstable with leakage. No stress incontinence. He voided with high pressures and low flow consistent with bladder outlet obstruction. EMG was quiet.  Prostate 130 g and he is on maximal medical therapy.  He may do well with an outlet  procedure but would likely need to be staged given his long prosthetic urethral length and large prostate volume.   Past Medical History Problems  1. History of Elevated prostate specific antigen (PSA) (790.93) 2. History of esophageal reflux (V12.79) 3. History of glaucoma (V12.49) 4. History of hypercholesterolemia (V12.29) 5. History of ulcerative colitis (V12.79) 6. History of Kidney Cancer (V10.52) 7. History of Microscopic hematuria (599.72) 8. History of Urinary retention (788.20) 9. History of Urinary retention (788.20)  Surgical History Problems  1. History of Cataract Surgery 2. History of Closed Treatment Of Clavicular Fracture 3. History of Complete Colonoscopy 4. History of Radical Nephrectomy 5. History of Rotator Cuff Repair 6. History of Shoulder Surgery  Current Meds 1. Apriso 0.375 GM Oral Capsule Extended Release 24 Hour;  Therapy: (Recorded:10Feb2015) to Recorded 2. Aspirin Low Dose 81 MG Oral Tablet;  Therapy: (Recorded:14Jan2008) to Recorded 3. AzaTHIOprine 50 MG Oral Tablet;  Therapy: (Recorded:10Feb2015) to Recorded 4. Calcium + D TABS;  Therapy: (Recorded:14Jan2008) to Recorded 5. Canasa 1000 MG Rectal Suppository;  Therapy: (Recorded:10Feb2015) to Recorded 6. CoQ10 CAPS;  Therapy: (Recorded:19Feb2014) to Recorded 7. Dorzolamide HCl - 2 % Ophthalmic Solution;  Therapy: (Recorded:30May2012) to Recorded 8. Finasteride 5 MG Oral Tablet; Take 1 tablet daily  Requested for: [id number]; Last  Rx:12May2014 Ordered 9. Fish Oil CAPS;  Therapy: (Recorded:14Jan2008) to Recorded 10. Fluorouracil 5 % External Cream;   Therapy: (Recorded:10Feb2015) to Recorded 11. Imuran 50 MG Oral Tablet;   Therapy: (Recorded:14Jan2008) to Recorded 12. Latanoprost 0.005 % Ophthalmic Solution;   Therapy: (Recorded:30May2012) to Recorded 13. Levothyroxine Sodium 50 MCG Oral Tablet;   Therapy: (Recorded:08Nov2011) to Recorded 14. Metrogel GEL;   Therapy: (Recorded:14Jan2008)  to Recorded 15. Multi-Vitamin TABS;   Therapy: (Recorded:14Jan2008) to Recorded 16. Omeprazole 20 MG Oral Capsule Delayed Release;   Therapy: (Recorded:14Jan2008) to Recorded 17. Simvastatin 20 MG Oral Tablet;   Therapy: (Recorded:10Feb2015) to Recorded 18. Slo-Niacin TBCR;   Therapy: (Recorded:10Feb2015) to Recorded 19. Tamsulosin HCl - 0.4 MG Oral Capsule; TAKE 1 CAPSULE Daily Take with meals;   Therapy: [id number] to (Evaluate:24May2015)  Requested for: [id number]; Last   Rx:29May2014 Ordered 20. Timolol Maleate 0.5 % Ophthalmic Solution;   Therapy: (Recorded:30May2012) to Recorded 21. Viagra 100 MG Oral Tablet; TAKE 1/2 TO 1 TABLET ONCEDAILY AS NEEDED;   Therapy: [id number] to (Evaluate:20Apr2014); Last Rx:19Feb2014 Ordered 22. Viagra 100 MG Oral Tablet;   Therapy: (Recorded:10Feb2015) to Recorded 23. Vitamin D3 2000 UNIT Oral Tablet;   Therapy: (Recorded:10Feb2015) to Recorded 24. X-Viate 40 % External Cream;   Therapy: (Recorded:09May2011) to Recorded  Allergies Medication  1. Lipitor TABS 2. Sporanox CAPS  Family History Problems  1. Family history of Breast Cancer (V16.3) : Mother 2. Family history of Family Health Status Number Of Children   5 daughters 3. Family history of Glaucoma : Father 51. Family history of Glaucoma : Mother  Social History Problems  1. Alcohol Use   1 - 2 per week 2. Family history of Death In The Family Father   age 86 Heart Failure 3. Family history of Death In The Family Mother   age 18 Stroke 4. Former smoker (V15.82) 5. Marital History - Currently Married 6. Occupation:   Retired Hotel manager 7. History of Tobacco Use (V15.82)   Quit 45 years ago, smoked for 7 years at 1 - 2 packs per weekCigar occasional  Review of Systems Genitourinary, constitutional, skin, eye, otolaryngeal, hematologic/lymphatic, cardiovascular, pulmonary, endocrine, musculoskeletal, gastrointestinal, neurological and psychiatric system(s) were reviewed and  pertinent findings if present are noted.    Physical Exam Constitutional: Well nourished and well developed . No acute distress.  Pulmonary: No respiratory distress and normal respiratory rhythm and effort.  Cardiovascular: Heart rate and rhythm are normal . No peripheral edema.  Neuro/Psych:. Mood and affect are appropriate.    Assessment Assessed  1. Benign localized prostatic hyperplasia with lower urinary tract symptoms (LUTS)  (600.21,599.69)  Plan Benign localized prostatic hyperplasia with lower urinary tract symptoms (LUTS)  1. Follow-up Schedule Surgery Office  Follow-up  Status: Hold For - Appointment   Requested for: [id number]  Discussion/Summary Discussed outlet procedures and GL -  Discussed bleeding, infection, incontinence, impotence, stricture among others. Need for staged procedure. Discussed flow symptoms typically improve , frequency and urgency might improve but also can persist or worsen in some patients. We discussed indications for surgery and that his procedure would basically be elective. He would like to get off the medicines. We discussed we would try to stop meds after about 6 weeks again unless he needed a staged procedure. We discussed Viagra would still necessary - discussed nature of PDE5i. Nature, r/b of alpha blockers and finasteride. Discussed post-op course. All questions answered and elects to proceed with greenlight photo vaporization of the prostate.  Signatures Electronically signed by : Festus Aloe, M.D.; Jul 20 2013  3:29PM EST

## 2013-08-09 NOTE — Anesthesia Preprocedure Evaluation (Signed)
Anesthesia Evaluation  Patient identified by MRN, date of birth, ID band Patient awake    Reviewed: Allergy & Precautions, H&P , NPO status , Patient's Chart, lab work & pertinent test results  History of Anesthesia Complications (+) history of anesthetic complications  Airway Mallampati: II TM Distance: >3 FB Neck ROM: Full    Dental no notable dental hx.    Pulmonary former smoker,  breath sounds clear to auscultation  Pulmonary exam normal       Cardiovascular Exercise Tolerance: Good Rhythm:Regular Rate:Normal     Neuro/Psych negative neurological ROS  negative psych ROS   GI/Hepatic negative GI ROS, Neg liver ROS, GERD-  Medicated,  Endo/Other  Hypothyroidism   Renal/GU Renal diseaseH/O renal cell cancer  negative genitourinary   Musculoskeletal negative musculoskeletal ROS (+)   Abdominal   Peds negative pediatric ROS (+)  Hematology negative hematology ROS (+)   Anesthesia Other Findings   Reproductive/Obstetrics negative OB ROS                           Anesthesia Physical Anesthesia Plan  ASA: II  Anesthesia Plan: General   Post-op Pain Management:    Induction: Intravenous  Airway Management Planned: LMA  Additional Equipment:   Intra-op Plan:   Post-operative Plan: Extubation in OR  Informed Consent: I have reviewed the patients History and Physical, chart, labs and discussed the procedure including the risks, benefits and alternatives for the proposed anesthesia with the patient or authorized representative who has indicated his/her understanding and acceptance.   Dental advisory given  Plan Discussed with: CRNA  Anesthesia Plan Comments:         Anesthesia Quick Evaluation

## 2013-08-10 ENCOUNTER — Ambulatory Visit (HOSPITAL_BASED_OUTPATIENT_CLINIC_OR_DEPARTMENT_OTHER): Payer: Medicare HMO | Admitting: Anesthesiology

## 2013-08-10 ENCOUNTER — Ambulatory Visit (HOSPITAL_BASED_OUTPATIENT_CLINIC_OR_DEPARTMENT_OTHER)
Admission: RE | Admit: 2013-08-10 | Discharge: 2013-08-10 | Disposition: A | Discharge status: Home / Self Care | Payer: Medicare HMO | Source: Ambulatory Visit | Attending: Urology | Admitting: Urology

## 2013-08-10 ENCOUNTER — Encounter (HOSPITAL_BASED_OUTPATIENT_CLINIC_OR_DEPARTMENT_OTHER): Payer: Medicare HMO | Admitting: Anesthesiology

## 2013-08-10 ENCOUNTER — Encounter (HOSPITAL_BASED_OUTPATIENT_CLINIC_OR_DEPARTMENT_OTHER): Payer: Self-pay | Admitting: *Deleted

## 2013-08-10 ENCOUNTER — Encounter (HOSPITAL_BASED_OUTPATIENT_CLINIC_OR_DEPARTMENT_OTHER)
Admission: RE | Disposition: A | Discharge status: Home / Self Care | Payer: Self-pay | Source: Ambulatory Visit | Attending: Urology

## 2013-08-10 DIAGNOSIS — H409 Unspecified glaucoma: Secondary | ICD-10-CM | POA: Insufficient documentation

## 2013-08-10 DIAGNOSIS — Z79899 Other long term (current) drug therapy: Secondary | ICD-10-CM | POA: Insufficient documentation

## 2013-08-10 DIAGNOSIS — N320 Bladder-neck obstruction: Secondary | ICD-10-CM | POA: Insufficient documentation

## 2013-08-10 DIAGNOSIS — K219 Gastro-esophageal reflux disease without esophagitis: Secondary | ICD-10-CM | POA: Insufficient documentation

## 2013-08-10 DIAGNOSIS — C649 Malignant neoplasm of unspecified kidney, except renal pelvis: Secondary | ICD-10-CM | POA: Insufficient documentation

## 2013-08-10 DIAGNOSIS — N529 Male erectile dysfunction, unspecified: Secondary | ICD-10-CM | POA: Insufficient documentation

## 2013-08-10 DIAGNOSIS — N138 Other obstructive and reflux uropathy: Secondary | ICD-10-CM | POA: Insufficient documentation

## 2013-08-10 DIAGNOSIS — Z87891 Personal history of nicotine dependence: Secondary | ICD-10-CM | POA: Insufficient documentation

## 2013-08-10 DIAGNOSIS — E7800 Pure hypercholesterolemia, unspecified: Secondary | ICD-10-CM | POA: Insufficient documentation

## 2013-08-10 DIAGNOSIS — Z905 Acquired absence of kidney: Secondary | ICD-10-CM | POA: Insufficient documentation

## 2013-08-10 DIAGNOSIS — Z7982 Long term (current) use of aspirin: Secondary | ICD-10-CM | POA: Insufficient documentation

## 2013-08-10 DIAGNOSIS — N401 Enlarged prostate with lower urinary tract symptoms: Secondary | ICD-10-CM | POA: Insufficient documentation

## 2013-08-10 HISTORY — PX: GREEN LIGHT LASER TURP (TRANSURETHRAL RESECTION OF PROSTATE: SHX6260

## 2013-08-10 HISTORY — DX: Pain in unspecified lower leg: M79.669

## 2013-08-10 HISTORY — DX: Benign prostatic hyperplasia without lower urinary tract symptoms: N40.0

## 2013-08-10 HISTORY — DX: Hypothyroidism, unspecified: E03.9

## 2013-08-10 HISTORY — DX: Male erectile dysfunction, unspecified: N52.9

## 2013-08-10 HISTORY — DX: Personal history of other diseases of the digestive system: Z87.19

## 2013-08-10 HISTORY — DX: Personal history of other malignant neoplasm of kidney: Z85.528

## 2013-08-10 HISTORY — DX: Actinic keratosis: L57.0

## 2013-08-10 HISTORY — DX: Rosacea, unspecified: L71.9

## 2013-08-10 HISTORY — DX: Hyperlipidemia, unspecified: E78.5

## 2013-08-10 LAB — POCT HEMOGLOBIN-HEMACUE: Hemoglobin: 14.1 g/dL (ref 13.0–17.0)

## 2013-08-10 SURGERY — GREEN LIGHT LASER TURP (TRANSURETHRAL RESECTION OF PROSTATE
Anesthesia: General | Site: Prostate

## 2013-08-10 MED ORDER — ASPIRIN 81 MG PO TABS: 81.0000 mg | ORAL_TABLET | Freq: Every day | ORAL | Status: DC

## 2013-08-10 MED ORDER — MIDAZOLAM HCL 2 MG/2ML IJ SOLN
INTRAMUSCULAR | Status: AC
Start: 2013-08-10 — End: 2013-08-10
  Filled 2013-08-10: qty 2

## 2013-08-10 MED ORDER — BELLADONNA ALKALOIDS-OPIUM 16.2-60 MG RE SUPP
RECTAL | Status: AC
  Filled 2013-08-10: qty 1

## 2013-08-10 MED ORDER — FENTANYL CITRATE 0.05 MG/ML IJ SOLN
25.0000 ug | INTRAMUSCULAR | Status: DC | PRN
  Filled 2013-08-10: qty 1

## 2013-08-10 MED ORDER — LIDOCAINE HCL 2 % EX GEL
CUTANEOUS | Status: DC | PRN
  Administered 2013-08-10: 1 via URETHRAL

## 2013-08-10 MED ORDER — LACTATED RINGERS IV SOLN
INTRAVENOUS | Status: DC
  Administered 2013-08-10 (×2): via INTRAVENOUS
  Filled 2013-08-10: qty 1000

## 2013-08-10 MED ORDER — ONDANSETRON HCL 4 MG/2ML IJ SOLN
INTRAMUSCULAR | Status: DC | PRN
Start: 2013-08-10 — End: 2013-08-10
  Administered 2013-08-10: 4 mg via INTRAVENOUS

## 2013-08-10 MED ORDER — BELLADONNA ALKALOIDS-OPIUM 16.2-60 MG RE SUPP
RECTAL | Status: DC | PRN
Start: 2013-08-10 — End: 2013-08-10
  Administered 2013-08-10: 1 via RECTAL

## 2013-08-10 MED ORDER — EPHEDRINE SULFATE 50 MG/ML IJ SOLN
INTRAMUSCULAR | Status: DC | PRN
  Administered 2013-08-10 (×2): 10 mg via INTRAVENOUS

## 2013-08-10 MED ORDER — CEFAZOLIN SODIUM 1-5 GM-% IV SOLN
1.0000 g | INTRAVENOUS | Status: DC
  Filled 2013-08-10: qty 50

## 2013-08-10 MED ORDER — LIDOCAINE HCL (CARDIAC) 20 MG/ML IV SOLN
INTRAVENOUS | Status: DC | PRN
  Administered 2013-08-10: 80 mg via INTRAVENOUS

## 2013-08-10 MED ORDER — OXYCODONE-ACETAMINOPHEN 5-325 MG PO TABS
1.0000 | ORAL_TABLET | ORAL | Status: DC | PRN
  Administered 2013-08-10: 1 via ORAL
  Filled 2013-08-10: qty 2

## 2013-08-10 MED ORDER — FENTANYL CITRATE 0.05 MG/ML IJ SOLN
INTRAMUSCULAR | Status: DC | PRN
  Administered 2013-08-10: 50 ug via INTRAVENOUS
  Administered 2013-08-10 (×2): 25 ug via INTRAVENOUS

## 2013-08-10 MED ORDER — PROPOFOL 10 MG/ML IV BOLUS
INTRAVENOUS | Status: DC | PRN
  Administered 2013-08-10: 170 mg via INTRAVENOUS

## 2013-08-10 MED ORDER — FENTANYL CITRATE 0.05 MG/ML IJ SOLN
INTRAMUSCULAR | Status: AC
  Filled 2013-08-10: qty 6

## 2013-08-10 MED ORDER — CEFAZOLIN SODIUM-DEXTROSE 2-3 GM-% IV SOLR
2.0000 g | INTRAVENOUS | Status: AC
  Administered 2013-08-10: 2 g via INTRAVENOUS
  Filled 2013-08-10: qty 50

## 2013-08-10 MED ORDER — CEPHALEXIN 500 MG PO CAPS: 500.0000 mg | ORAL_CAPSULE | Freq: Two times a day (BID) | ORAL | Status: DC

## 2013-08-10 MED ORDER — OXYCODONE-ACETAMINOPHEN 5-325 MG PO TABS
ORAL_TABLET | ORAL | Status: AC
  Filled 2013-08-10: qty 1

## 2013-08-10 MED ORDER — DEXAMETHASONE SODIUM PHOSPHATE 4 MG/ML IJ SOLN
INTRAMUSCULAR | Status: DC | PRN
  Administered 2013-08-10: 8 mg via INTRAVENOUS

## 2013-08-10 MED ORDER — SODIUM CHLORIDE 0.9 % IR SOLN
Status: DC | PRN
  Administered 2013-08-10: 13000 mL

## 2013-08-10 MED ORDER — OXYCODONE-ACETAMINOPHEN 5-325 MG PO TABS: 1.0000 | ORAL_TABLET | ORAL | Status: DC | PRN

## 2013-08-10 SURGICAL SUPPLY — 32 items
BAG URINE DRAINAGE (UROLOGICAL SUPPLIES) ×2 IMPLANT
BAG URO CATCHER STRL LF (DRAPE) ×2 IMPLANT
CANISTER SUCT LVC 12 LTR MEDI- (MISCELLANEOUS) ×1 IMPLANT
CANISTER SUCTION 2500CC (MISCELLANEOUS) ×1 IMPLANT
CATH COUDE FOLEY 2W 5CC 18FR (CATHETERS) ×1 IMPLANT
CATH FOLEY 2WAY SLVR  5CC 18FR (CATHETERS)
CATH FOLEY 2WAY SLVR 5CC 18FR (CATHETERS) IMPLANT
CLOTH BEACON ORANGE TIMEOUT ST (SAFETY) ×2 IMPLANT
DRAPE CAMERA CLOSED 9X96 (DRAPES) ×2 IMPLANT
ELECT BUTTON HF 24-28F 2 30DE (ELECTRODE) IMPLANT
ELECT LOOP MED HF 24F 12D (CUTTING LOOP) IMPLANT
ELECT LOOP MED HF 24F 12D CBL (CLIP) IMPLANT
ELECT RESECT VAPORIZE 12D CBL (ELECTRODE) IMPLANT
FEE RENTAL LASER GREENLIGHT (Laser) ×1 IMPLANT
GLOVE BIO SURGEON STRL SZ7.5 (GLOVE) ×2 IMPLANT
GLOVE BIOGEL M STER SZ 6 (GLOVE) ×1 IMPLANT
GLOVE BIOGEL PI IND STRL 6.5 (GLOVE) IMPLANT
GLOVE BIOGEL PI INDICATOR 6.5 (GLOVE) ×2
GOWN STRL REIN XL XLG (GOWN DISPOSABLE) ×1 IMPLANT
GOWN STRL REUS W/TWL LRG LVL3 (GOWN DISPOSABLE) ×1 IMPLANT
GOWN STRL REUS W/TWL XL LVL3 (GOWN DISPOSABLE) ×1 IMPLANT
HOLDER FOLEY CATH W/STRAP (MISCELLANEOUS) ×1 IMPLANT
IV NS 1000ML (IV SOLUTION) ×2
IV NS 1000ML BAXH (IV SOLUTION) ×1 IMPLANT
IV NS IRRIG 3000ML ARTHROMATIC (IV SOLUTION) ×5 IMPLANT
LASER FIBER /GREENLIGHT LASER (Laser) ×2 IMPLANT
LASER GREENLIGHT RENTAL P/PROC (Laser) ×2 IMPLANT
PACK CYSTOSCOPY (CUSTOM PROCEDURE TRAY) ×2 IMPLANT
SYR 30ML LL (SYRINGE) IMPLANT
SYRINGE IRR TOOMEY STRL 70CC (SYRINGE) IMPLANT
TUBE CONNECTING 12X1/4 (SUCTIONS) ×1 IMPLANT
TUBING SUCTION BULK 100 FT (MISCELLANEOUS) IMPLANT

## 2013-08-10 NOTE — Discharge Instructions (Signed)
Joseph Hanna Laser Prostate Treatment Prostate Laser Surgery, Care After Refer to this sheet in the next few weeks. These instructions provide you with information on caring for yourself after your procedure. Your health care provider may also give you more specific instructions. Your treatment has been planned according to current medical practices, but problems sometimes occur. Call your health care provider if you have any problems or questions after your procedure. WHAT TO EXPECT AFTER THE PROCEDURE  Blood in your urine will last from a few days to 3 weeks. You may have bleeding in the urine.  After your catheter is removed, you will have burning (especially at the tip of your penis) when you urinate, especially at the end of urination. For the first few weeks after your procedure, you will feel the need to urinate often. HOME CARE INSTRUCTIONS   Do not perform vigorous exercise, especially heavy lifting, for 1 week or as directed by your health care provider.  Avoid sexual activity for 4 6 weeks or as directed by your health care provider.  Do not ride in a car for extended periods for 1 month or as directed by your health care provider.  Do not strain to have a bowel movement. Drink a lot of fluids and and make sure you get enough fiber in your diet.  Drink enough fluids to keep your urine clear or pale yellow. SEEK IMMEDIATE MEDICAL CARE IF:   Your catheter has been removed and you are suddenly unable to urinate.  Your catheter has not been removed, and it develops a blockage.  You start to have blood clots in your urine.  The blood in your urine becomes persistent or gets thick.  Your temperature is greater than 100.33F (38.1C).  You develop chest pains.  You develop shortness of breath.  You develop leg swelling or pain.  Foley Catheter Care, Adult A Foley catheter is a soft, flexible tube that is placed into the bladder to drain urine. A Foley catheter may be inserted  if:  You leak urine or are not able to control when you urinate (urinary incontinence).  You are not able to urinate when you need to (urinary retention).  You had prostate surgery or surgery on the genitals.  You have certain medical conditions, such as multiple sclerosis, dementia, or a spinal cord injury. If you are going home with a Foley catheter in place, follow the instructions below. TAKING CARE OF THE CATHETER 1. Wash your hands with soap and water. 2. Using mild soap and warm water on a clean washcloth:  Clean the area on your body closest to the catheter insertion site using a circular motion, moving away from the catheter. Never wipe toward the catheter because this could sweep bacteria up into the urethra and cause infection.  Remove all traces of soap. Pat the area dry with a clean towel. For males, reposition the foreskin. 3. Attach the catheter to your leg so there is no tension on the catheter. Use adhesive tape or a leg strap. If you are using adhesive tape, remove any sticky residue left behind by the previous tape you used. 4. Keep the drainage bag below the level of the bladder, but keep it off the floor. 5. Check throughout the day to be sure the catheter is working and urine is draining freely. Make sure the tubing does not become kinked. 6. Do not pull on the catheter or try to remove it. Pulling could damage internal tissues. TAKING CARE OF  THE DRAINAGE BAGS You will be given two drainage bags to take home. One is a large overnight drainage bag, and the other is a smaller leg bag that fits underneath clothing. You may wear the overnight bag at any time, but you should never wear the smaller leg bag at night. Follow the instructions below for how to empty, change, and clean your drainage bags. Emptying the Drainage Bag You must empty your drainage bag when it is    full or at least 2 3 times a day. 1. Wash your hands with soap and water. 2. Keep the drainage bag  below your hips, below the level of your bladder. This stops urine from going back into the tubing and into your bladder. 3. Hold the dirty bag over the toilet or a clean container. 4. Open the pour spout at the bottom of the bag and empty the urine into the toilet or container. Do not let the pour spout touch the toilet, container, or any other surface. Doing so can place bacteria on the bag, which can cause an infection. 5. Clean the pour spout with a gauze pad or cotton ball that has rubbing alcohol on it. 6. Close the pour spout. 7. Attach the bag to your leg with adhesive tape or a leg strap. 8. Wash your hands well. Changing the Drainage Bag Change your drainage bag once a month or sooner if it starts to smell bad or look dirty. Below are steps to follow when changing the drainage bag. 1. Wash your hands with soap and water. 2. Pinch off the rubber catheter so that urine does not spill out. 3. Disconnect the catheter tube from the drainage tube at the connection valve. Do not let the tubes touch any surface. 4. Clean the end of the catheter tube with an alcohol wipe. Use a different alcohol wipe to clean the end of the drainage tube. 5. Connect the catheter tube to the drainage tube of the clean drainage bag. 6. Attach the new bag to the leg with adhesive tape or a leg strap. Avoid attaching the new bag too tightly. 7. Wash your hands well. Cleaning the Drainage Bag 1. Wash your hands with soap and water. 2. Wash the bag in warm, soapy water. 3. Rinse the bag thoroughly with warm water. 4. Fill the bag with a solution of white vinegar and water (1 cup vinegar to 1 qt warm water [.2 L vinegar to 1 L warm water]). Close the bag and soak it for 30 minutes in the solution. 5. Rinse the bag with warm water. 6. Hang the bag to dry with the pour spout open and hanging downward. 7. Store the clean bag (once it is dry) in a clean plastic bag. 8. Wash your hands well. PREVENTING  INFECTION  Wash your hands before and after handling your catheter.  Take showers daily and wash the area where the catheter enters your body. Do not take baths. Replace wet leg straps with dry ones, if this applies.  Do not use powders, sprays, or lotions on the genital area. Only use creams, lotions, or ointments as directed by your caregiver.  For females, wipe from front to back after each bowel movement.  Drink enough fluids to keep your urine clear or pale yellow unless you have a fluid restriction.  Do not let the drainage bag or tubing touch or lie on the floor.  Wear cotton underwear to absorb moisture and to keep your skin drier. [place]  CARE IF:   Your urine is cloudy or smells unusually bad.  Your catheter becomes clogged.  You are not draining urine into the bag or your bladder feels full.  Your catheter starts to leak. SEEK IMMEDIATE MEDICAL CARE IF:   You have pain, swelling, redness, or pus where the catheter enters the body.  You have pain in the abdomen, legs, lower back, or bladder.  You have a fever.  You see blood fill the catheter, or your urine is pink or red.  You have nausea, vomiting, or chills.  Your catheter gets pulled out. MAKE SURE YOU:   Understand these instructions.  Will watch your condition.  Will get help right away if you are not doing well or get worse. Document Released: 04/29/2005 Document Revised: 08/24/2012 Document Reviewed: 04/20/2012 [facility] Patient Information 2014 [place].   Post Anesthesia Home Care Instructions  Activity: Get plenty of rest for the remainder of the day. A responsible adult should stay with you for 24 hours following the procedure.  For the next 24 hours, DO NOT: -Drive a car -Paediatric nurse -Drink alcoholic beverages -Take any medication unless instructed by your physician -Make any legal decisions or sign important papers.  Meals: Start with liquid foods such as gelatin or  soup. Progress to regular foods as tolerated. Avoid greasy, spicy, heavy foods. If nausea and/or vomiting occur, drink only clear liquids until the nausea and/or vomiting subsides. Call your physician if vomiting continues.  Special Instructions/Symptoms: Your throat may feel dry or sore from the anesthesia or the breathing tube placed in your throat during surgery. If this causes discomfort, gargle with warm salt water. The discomfort should disappear within 24 hours.

## 2013-08-10 NOTE — Op Note (Signed)
Preoperative diagnosis: BPH, bladder outlet obstruction Postoperative diagnosis: Same  Procedure: Exam under anesthesia, Greenlight photo vaporization of the prostate (lasing time 39 minutes and 11 seconds, joules 250,991; watts 80-120 )  Surgeon: Junious Silk Anesthesia: Denenny  Type of anesthesia: Gen.  Findings: On exam under anesthesia the penis was normal without mass or lesion, the testicles were descended and without mass. The prostate on digital rectal exam was enlarged with BPH but there were no hard areas or induration. The lateral sulci were preserved normally. A B&O suppository was placed.  On cystoscopy there was trilobar hypertrophy with a bilobed median lobe causing bladder outlet obstruction. The bladder was moderately trabeculated but contained no foreign body, or stone. The mucosa appeared normal.  Description of procedure: After consent was obtained patient brought to the operating room. After adequate anesthesia he is placed in lithotomy position. A timeout was performed to confirm the patient and procedure. An exam under anesthesia was performed. He was prepped and draped in the usual sterile fashion. The laser scope was advanced per urethra and the bladder inspected. I began vaporizing the lateral lobes from the bladder neck down to the area of anterior to posterior. Right and left. This created a working channel. The ureteral orifices were again identified. I then made incisions at 5:00 and 7:00 down to the bladder neck to gauge the depth of vaporization of the median lobes. The median lobes were then vaporized mainly going from a lateral to medial and of the laser. The very last piece of the median lobe hanging off in the bladder was swept clean off the mucosa with the scope. It was too large to drain through the laser scope. Some of the lateral location was cleaned up on each side. The bladder was drained adequate hemostasis was good at low-pressure. The ureteral orifices were  carefully inspected and noted to be normal. I did not vaporize out past the verumontanum. This created a nice channel. There was some remaining apical and anterior tissue. The laser scope was removed. The cystoscope was passed per urethra and the piece of median lobe was grasped and removed. Reinspection again noted there to be excellent hemostasis and the bladder free of any other debris. The scope was removed and an 37 [place] coud catheter was placed. The balloon was inflated with 15-1/2 mL of fluid and seated at the bladder neck. The drainage was clear and the catheter irrigated normally. The patient was awakened and taken to recovery room in stable condition.  Blood loss: Minimal Complications: None Specimens: None  Drains: 18 French Foley  Disposition: Patient stable to PACU

## 2013-08-10 NOTE — Transfer of Care (Signed)
Immediate Anesthesia Transfer of Care Note  Patient: Joseph Hanna  Procedure(s) Performed: Procedure(s) (LRB): GREEN LIGHT LASER TURP (TRANSURETHRAL RESECTION OF PROSTATE (N/A)  Patient Location: PACU  Anesthesia Type: General  Level of Consciousness: awake, oriented, sedated and patient cooperative  Airway & Oxygen Therapy: Patient Spontanous Breathing and Patient connected to face mask oxygen  Post-op Assessment: Report given to PACU RN and Post -op Vital signs reviewed and stable  Post vital signs: Reviewed and stable  Complications: No apparent anesthesia complications

## 2013-08-10 NOTE — Interval H&P Note (Signed)
History and Physical Interval Note:  08/10/2013 7:24 AM  Joseph Hanna  has presented today for surgery, with the diagnosis of BPH  The various methods of treatment have been discussed with the patient and family. After consideration of risks, benefits and other options for treatment, the patient has consented to  Procedure(s): GREEN LIGHT LASER TURP (TRANSURETHRAL RESECTION OF PROSTATE (N/A) as a surgical intervention .  The patient's history has been reviewed, patient examined, no change in status, stable for surgery.  I have reviewed the patient's chart and labs.  Questions were answered to the patient's satisfaction.  I discussed with the patient the nature, potential benefits, risks and alternatives to Greenlight PVP - staged, including side effects of the proposed treatment, the likelihood of the patient achieving the goals of the procedure, and any potential problems that might occur during the procedure or recuperation. All questions answered. Patient elects to proceed.     Joseph Hanna

## 2013-08-10 NOTE — Anesthesia Postprocedure Evaluation (Signed)
  Anesthesia Post-op Note  Patient: Joseph Hanna  Procedure(s) Performed: Procedure(s) (LRB): GREEN LIGHT LASER TURP (TRANSURETHRAL RESECTION OF PROSTATE (N/A)  Patient Location: PACU  Anesthesia Type: General  Level of Consciousness: awake and alert   Airway and Oxygen Therapy: Patient Spontanous Breathing  Post-op Pain: mild  Post-op Assessment: Post-op Vital signs reviewed, Patient's Cardiovascular Status Stable, Respiratory Function Stable, Patent Airway and No signs of Nausea or vomiting  Last Vitals:  Filed Vitals:   08/10/13 0945  BP: 140/75  Pulse: 55  Temp:   Resp: 12    Post-op Vital Signs: stable   Complications: No apparent anesthesia complications

## 2013-08-10 NOTE — Anesthesia Procedure Notes (Signed)
Procedure Name: LMA Insertion Date/Time: 08/10/2013 7:33 AM Performed by: Denna Haggard D Pre-anesthesia Checklist: Patient identified, Emergency Drugs available, Suction available and Patient being monitored Patient Re-evaluated:Patient Re-evaluated prior to inductionOxygen Delivery Method: Circle System Utilized Preoxygenation: Pre-oxygenation with 100% oxygen Intubation Type: IV induction Ventilation: Mask ventilation without difficulty LMA: LMA inserted LMA Size: 5.0 Number of attempts: 1 Airway Equipment and Method: bite block Placement Confirmation: positive ETCO2 Tube secured with: Tape Dental Injury: Teeth and Oropharynx as per pre-operative assessment

## 2013-08-11 ENCOUNTER — Encounter (HOSPITAL_BASED_OUTPATIENT_CLINIC_OR_DEPARTMENT_OTHER): Payer: Self-pay | Admitting: Urology

## 2013-12-02 ENCOUNTER — Other Ambulatory Visit: Payer: Self-pay | Admitting: Dermatology

## 2014-04-26 ENCOUNTER — Other Ambulatory Visit: Payer: Self-pay | Admitting: Dermatology

## 2015-02-14 DIAGNOSIS — Z4802 Encounter for removal of sutures: Secondary | ICD-10-CM | POA: Diagnosis not present

## 2015-02-21 DIAGNOSIS — R972 Elevated prostate specific antigen [PSA]: Secondary | ICD-10-CM | POA: Diagnosis not present

## 2015-02-21 DIAGNOSIS — H401133 Primary open-angle glaucoma, bilateral, severe stage: Secondary | ICD-10-CM | POA: Diagnosis not present

## 2015-05-30 DIAGNOSIS — H401123 Primary open-angle glaucoma, left eye, severe stage: Secondary | ICD-10-CM | POA: Diagnosis not present

## 2015-05-30 DIAGNOSIS — H401112 Primary open-angle glaucoma, right eye, moderate stage: Secondary | ICD-10-CM | POA: Diagnosis not present

## 2015-06-06 DIAGNOSIS — Z0000 Encounter for general adult medical examination without abnormal findings: Secondary | ICD-10-CM | POA: Diagnosis not present

## 2015-06-06 DIAGNOSIS — R3915 Urgency of urination: Secondary | ICD-10-CM | POA: Diagnosis not present

## 2015-06-06 DIAGNOSIS — N401 Enlarged prostate with lower urinary tract symptoms: Secondary | ICD-10-CM | POA: Diagnosis not present

## 2015-06-06 DIAGNOSIS — R972 Elevated prostate specific antigen [PSA]: Secondary | ICD-10-CM | POA: Diagnosis not present

## 2015-06-19 ENCOUNTER — Inpatient Hospital Stay (HOSPITAL_COMMUNITY): Payer: Medicare HMO

## 2015-06-19 ENCOUNTER — Emergency Department (HOSPITAL_COMMUNITY): Payer: Medicare HMO

## 2015-06-19 ENCOUNTER — Encounter (HOSPITAL_COMMUNITY): Payer: Self-pay | Admitting: Cardiology

## 2015-06-19 ENCOUNTER — Observation Stay (HOSPITAL_COMMUNITY)
Admission: EM | Admit: 2015-06-19 | Discharge: 2015-06-21 | Disposition: A | Discharge status: Home / Self Care | Payer: Medicare HMO | Attending: Internal Medicine | Admitting: Internal Medicine

## 2015-06-19 DIAGNOSIS — Z961 Presence of intraocular lens: Secondary | ICD-10-CM | POA: Insufficient documentation

## 2015-06-19 DIAGNOSIS — E039 Hypothyroidism, unspecified: Secondary | ICD-10-CM

## 2015-06-19 DIAGNOSIS — Z820 Family history of epilepsy and other diseases of the nervous system: Secondary | ICD-10-CM | POA: Diagnosis not present

## 2015-06-19 DIAGNOSIS — Z7982 Long term (current) use of aspirin: Secondary | ICD-10-CM | POA: Diagnosis not present

## 2015-06-19 DIAGNOSIS — Z9841 Cataract extraction status, right eye: Secondary | ICD-10-CM | POA: Insufficient documentation

## 2015-06-19 DIAGNOSIS — R471 Dysarthria and anarthria: Secondary | ICD-10-CM | POA: Diagnosis not present

## 2015-06-19 DIAGNOSIS — R490 Dysphonia: Secondary | ICD-10-CM | POA: Diagnosis not present

## 2015-06-19 DIAGNOSIS — D691 Qualitative platelet defects: Secondary | ICD-10-CM

## 2015-06-19 DIAGNOSIS — Z888 Allergy status to other drugs, medicaments and biological substances status: Secondary | ICD-10-CM | POA: Diagnosis not present

## 2015-06-19 DIAGNOSIS — L570 Actinic keratosis: Secondary | ICD-10-CM | POA: Diagnosis not present

## 2015-06-19 DIAGNOSIS — E785 Hyperlipidemia, unspecified: Secondary | ICD-10-CM | POA: Diagnosis present

## 2015-06-19 DIAGNOSIS — Z905 Acquired absence of kidney: Secondary | ICD-10-CM | POA: Insufficient documentation

## 2015-06-19 DIAGNOSIS — N289 Disorder of kidney and ureter, unspecified: Secondary | ICD-10-CM | POA: Diagnosis not present

## 2015-06-19 DIAGNOSIS — I129 Hypertensive chronic kidney disease with stage 1 through stage 4 chronic kidney disease, or unspecified chronic kidney disease: Secondary | ICD-10-CM | POA: Diagnosis not present

## 2015-06-19 DIAGNOSIS — M1990 Unspecified osteoarthritis, unspecified site: Secondary | ICD-10-CM | POA: Insufficient documentation

## 2015-06-19 DIAGNOSIS — N179 Acute kidney failure, unspecified: Secondary | ICD-10-CM | POA: Diagnosis not present

## 2015-06-19 DIAGNOSIS — K219 Gastro-esophageal reflux disease without esophagitis: Secondary | ICD-10-CM | POA: Insufficient documentation

## 2015-06-19 DIAGNOSIS — R4701 Aphasia: Secondary | ICD-10-CM | POA: Insufficient documentation

## 2015-06-19 DIAGNOSIS — Z79899 Other long term (current) drug therapy: Secondary | ICD-10-CM | POA: Diagnosis not present

## 2015-06-19 DIAGNOSIS — N189 Chronic kidney disease, unspecified: Secondary | ICD-10-CM | POA: Diagnosis present

## 2015-06-19 DIAGNOSIS — Z87891 Personal history of nicotine dependence: Secondary | ICD-10-CM | POA: Insufficient documentation

## 2015-06-19 DIAGNOSIS — I340 Nonrheumatic mitral (valve) insufficiency: Secondary | ICD-10-CM | POA: Insufficient documentation

## 2015-06-19 DIAGNOSIS — Z8719 Personal history of other diseases of the digestive system: Secondary | ICD-10-CM

## 2015-06-19 DIAGNOSIS — K5190 Ulcerative colitis, unspecified, without complications: Secondary | ICD-10-CM | POA: Diagnosis not present

## 2015-06-19 DIAGNOSIS — Z803 Family history of malignant neoplasm of breast: Secondary | ICD-10-CM | POA: Insufficient documentation

## 2015-06-19 DIAGNOSIS — Z9842 Cataract extraction status, left eye: Secondary | ICD-10-CM | POA: Diagnosis not present

## 2015-06-19 DIAGNOSIS — G459 Transient cerebral ischemic attack, unspecified: Secondary | ICD-10-CM | POA: Diagnosis not present

## 2015-06-19 DIAGNOSIS — L719 Rosacea, unspecified: Secondary | ICD-10-CM | POA: Diagnosis not present

## 2015-06-19 DIAGNOSIS — Z85528 Personal history of other malignant neoplasm of kidney: Secondary | ICD-10-CM

## 2015-06-19 DIAGNOSIS — H409 Unspecified glaucoma: Secondary | ICD-10-CM | POA: Insufficient documentation

## 2015-06-19 DIAGNOSIS — Z825 Family history of asthma and other chronic lower respiratory diseases: Secondary | ICD-10-CM | POA: Diagnosis not present

## 2015-06-19 DIAGNOSIS — R479 Unspecified speech disturbances: Secondary | ICD-10-CM | POA: Diagnosis present

## 2015-06-19 DIAGNOSIS — N183 Chronic kidney disease, stage 3 (moderate): Secondary | ICD-10-CM | POA: Diagnosis not present

## 2015-06-19 DIAGNOSIS — N400 Enlarged prostate without lower urinary tract symptoms: Secondary | ICD-10-CM | POA: Diagnosis not present

## 2015-06-19 DIAGNOSIS — D696 Thrombocytopenia, unspecified: Secondary | ICD-10-CM | POA: Diagnosis present

## 2015-06-19 DIAGNOSIS — R4789 Other speech disturbances: Secondary | ICD-10-CM | POA: Diagnosis not present

## 2015-06-19 LAB — COMPREHENSIVE METABOLIC PANEL
ALT: 21 U/L (ref 17–63)
AST: 26 U/L (ref 15–41)
Albumin: 4.2 g/dL (ref 3.5–5.0)
Alkaline Phosphatase: 36 U/L — ABNORMAL LOW (ref 38–126)
Anion gap: 12 (ref 5–15)
BUN: 19 mg/dL (ref 6–20)
CO2: 29 mmol/L (ref 22–32)
Calcium: 9.6 mg/dL (ref 8.9–10.3)
Chloride: 101 mmol/L (ref 101–111)
Creatinine, Ser: 1.53 mg/dL — ABNORMAL HIGH (ref 0.61–1.24)
GFR calc Af Amer: 48 mL/min — ABNORMAL LOW (ref >60)
GFR calc non Af Amer: 41 mL/min — ABNORMAL LOW (ref >60)
Glucose, Bld: 119 mg/dL — ABNORMAL HIGH (ref 65–99)
Potassium: 4.3 mmol/L (ref 3.5–5.1)
Sodium: 142 mmol/L (ref 135–145)
Total Bilirubin: 0.9 mg/dL (ref 0.3–1.2)
Total Protein: 6.6 g/dL (ref 6.5–8.1)

## 2015-06-19 LAB — DIFFERENTIAL
Basophils Absolute: 0 10*3/uL (ref 0.0–0.1)
Basophils Relative: 0 %
Eosinophils Absolute: 0 10*3/uL (ref 0.0–0.7)
Eosinophils Relative: 0 %
Lymphocytes Relative: 28 %
Lymphs Abs: 1.2 10*3/uL (ref 0.7–4.0)
Monocytes Absolute: 0.9 10*3/uL (ref 0.1–1.0)
Monocytes Relative: 21 %
Neutro Abs: 2.2 10*3/uL (ref 1.7–7.7)
Neutrophils Relative %: 50 %

## 2015-06-19 LAB — PROTIME-INR
INR: 1.1 (ref 0.00–1.49)
Prothrombin Time: 14.4 seconds (ref 11.6–15.2)

## 2015-06-19 LAB — CBC
HCT: 40.8 % (ref 39.0–52.0)
Hemoglobin: 13.5 g/dL (ref 13.0–17.0)
MCH: 31.3 pg (ref 26.0–34.0)
MCHC: 33.1 g/dL (ref 30.0–36.0)
MCV: 94.7 fL (ref 78.0–100.0)
Platelets: 103 10*3/uL — ABNORMAL LOW (ref 150–400)
RBC: 4.31 MIL/uL (ref 4.22–5.81)
RDW: 13.8 % (ref 11.5–15.5)
WBC: 4.3 10*3/uL (ref 4.0–10.5)

## 2015-06-19 LAB — I-STAT TROPONIN, ED: Troponin i, poc: 0.01 ng/mL (ref 0.00–0.08)

## 2015-06-19 LAB — APTT: aPTT: 29 seconds (ref 24–37)

## 2015-06-19 MED ORDER — MESALAMINE 1000 MG RE SUPP
1000.0000 mg | Freq: Every day | RECTAL | Status: DC
  Administered 2015-06-20: 1000 mg via RECTAL
  Filled 2015-06-19 (×3): qty 1

## 2015-06-19 MED ORDER — LATANOPROST 0.005 % OP SOLN
1.0000 [drp] | Freq: Every day | OPHTHALMIC | Status: DC
  Administered 2015-06-20: 1 [drp] via OPHTHALMIC
  Filled 2015-06-19: qty 2.5

## 2015-06-19 MED ORDER — KETOCONAZOLE 2 % EX CREA
1.0000 "application " | TOPICAL_CREAM | Freq: Every day | CUTANEOUS | Status: DC
  Filled 2015-06-19: qty 15

## 2015-06-19 MED ORDER — UREA 40 % EX CREA: 40.0000 mg | TOPICAL_CREAM | Freq: Every day | CUTANEOUS | Status: DC

## 2015-06-19 MED ORDER — SODIUM CHLORIDE 0.9 % IV SOLN
INTRAVENOUS | Status: DC
  Administered 2015-06-20: 02:00:00 via INTRAVENOUS

## 2015-06-19 MED ORDER — TAMSULOSIN HCL 0.4 MG PO CAPS
0.4000 mg | ORAL_CAPSULE | Freq: Every day | ORAL | Status: DC
  Filled 2015-06-19: qty 1

## 2015-06-19 MED ORDER — TIMOLOL HEMIHYDRATE 0.5 % OP SOLN: 1.0000 [drp] | Freq: Two times a day (BID) | OPHTHALMIC | Status: DC

## 2015-06-19 MED ORDER — COQ10 200 MG PO CAPS: 1.0000 | ORAL_CAPSULE | Freq: Every day | ORAL | Status: DC

## 2015-06-19 MED ORDER — SIMVASTATIN 5 MG PO TABS
10.0000 mg | ORAL_TABLET | ORAL | Status: DC
  Administered 2015-06-20: 10 mg via ORAL
  Filled 2015-06-19: qty 2
  Filled 2015-06-19: qty 1

## 2015-06-19 MED ORDER — PANTOPRAZOLE SODIUM 40 MG PO TBEC: 40.0000 mg | DELAYED_RELEASE_TABLET | Freq: Every day | ORAL | Status: DC

## 2015-06-19 MED ORDER — GINKGO BILOBA 40 MG PO TABS: 1.0000 | ORAL_TABLET | Freq: Every day | ORAL | Status: DC

## 2015-06-19 MED ORDER — SENNOSIDES-DOCUSATE SODIUM 8.6-50 MG PO TABS
1.0000 | ORAL_TABLET | Freq: Every evening | ORAL | Status: DC | PRN
  Administered 2015-06-20: 1 via ORAL
  Filled 2015-06-19: qty 1

## 2015-06-19 MED ORDER — FINASTERIDE 5 MG PO TABS
5.0000 mg | ORAL_TABLET | Freq: Every day | ORAL | Status: DC
  Administered 2015-06-20: 5 mg via ORAL
  Filled 2015-06-19: qty 1

## 2015-06-19 MED ORDER — CALCIUM CARBONATE 1250 (500 CA) MG PO TABS
1250.0000 mg | ORAL_TABLET | Freq: Two times a day (BID) | ORAL | Status: DC
  Administered 2015-06-20: 1250 mg via ORAL
  Filled 2015-06-19 (×2): qty 1

## 2015-06-19 MED ORDER — DORZOLAMIDE HCL 2 % OP SOLN: 1.0000 [drp] | Freq: Three times a day (TID) | OPHTHALMIC | Status: DC

## 2015-06-19 MED ORDER — AZATHIOPRINE 50 MG PO TABS
75.0000 mg | ORAL_TABLET | Freq: Every day | ORAL | Status: DC
  Administered 2015-06-20: 75 mg via ORAL
  Filled 2015-06-19 (×2): qty 2

## 2015-06-19 MED ORDER — FLUOROURACIL 0.5 % EX CREA: TOPICAL_CREAM | Freq: Every day | CUTANEOUS | Status: DC

## 2015-06-19 MED ORDER — METRONIDAZOLE 0.75 % EX GEL
1.0000 "application " | Freq: Two times a day (BID) | CUTANEOUS | Status: DC | PRN
  Administered 2015-06-20: 1 via TOPICAL
  Filled 2015-06-19: qty 45

## 2015-06-19 MED ORDER — OMEGA-3-ACID ETHYL ESTERS 1 G PO CAPS
2.0000 g | ORAL_CAPSULE | Freq: Every day | ORAL | Status: DC
  Administered 2015-06-20: 2 g via ORAL
  Filled 2015-06-19: qty 2

## 2015-06-19 MED ORDER — OXYCODONE-ACETAMINOPHEN 5-325 MG PO TABS: 1.0000 | ORAL_TABLET | ORAL | Status: DC | PRN

## 2015-06-19 MED ORDER — ASPIRIN EC 81 MG PO TBEC
81.0000 mg | DELAYED_RELEASE_TABLET | Freq: Every day | ORAL | Status: DC
  Administered 2015-06-20 (×2): 81 mg via ORAL
  Filled 2015-06-19 (×2): qty 1

## 2015-06-19 MED ORDER — MESALAMINE ER 0.375 G PO CP24
2.2500 g | ORAL_CAPSULE | Freq: Every day | ORAL | Status: DC
  Filled 2015-06-19 (×2): qty 6

## 2015-06-19 MED ORDER — VITAMIN D 1000 UNITS PO TABS
1000.0000 [IU] | ORAL_TABLET | Freq: Every day | ORAL | Status: DC
  Administered 2015-06-20: 1000 [IU] via ORAL
  Filled 2015-06-19: qty 1

## 2015-06-19 MED ORDER — NIACIN ER 500 MG PO CPCR
500.0000 mg | ORAL_CAPSULE | Freq: Every day | ORAL | Status: DC
  Administered 2015-06-20 (×2): 500 mg via ORAL
  Filled 2015-06-19 (×3): qty 1

## 2015-06-19 MED ORDER — LEVOTHYROXINE SODIUM 50 MCG PO TABS: 50.0000 ug | ORAL_TABLET | Freq: Every day | ORAL | Status: DC

## 2015-06-19 MED ORDER — TIMOLOL MALEATE 0.5 % OP SOLN
1.0000 [drp] | Freq: Two times a day (BID) | OPHTHALMIC | Status: DC
  Administered 2015-06-20 (×2): 1 [drp] via OPHTHALMIC
  Filled 2015-06-19: qty 5

## 2015-06-19 MED ORDER — LATANOPROST 0.005 % OP SOLN: 1.0000 [drp] | Freq: Every day | OPHTHALMIC | Status: DC

## 2015-06-19 MED ORDER — STROKE: EARLY STAGES OF RECOVERY BOOK
Freq: Once | Status: AC
  Administered 2015-06-20: 02:00:00
  Filled 2015-06-19: qty 1

## 2015-06-19 MED ORDER — ADULT MULTIVITAMIN W/MINERALS CH
1.0000 | ORAL_TABLET | Freq: Every day | ORAL | Status: DC
  Administered 2015-06-20: 1 via ORAL
  Filled 2015-06-19: qty 1

## 2015-06-19 MED ORDER — ENOXAPARIN SODIUM 40 MG/0.4ML ~~LOC~~ SOLN: 40.0000 mg | Freq: Every day | SUBCUTANEOUS | Status: DC

## 2015-06-19 MED ORDER — DORZOLAMIDE HCL 2 % OP SOLN
1.0000 [drp] | Freq: Three times a day (TID) | OPHTHALMIC | Status: DC
  Administered 2015-06-20 (×2): 1 [drp] via OPHTHALMIC
  Filled 2015-06-19: qty 10

## 2015-06-19 NOTE — ED Notes (Signed)
Pt reprot called pt to MRI

## 2015-06-19 NOTE — ED Provider Notes (Signed)
CSN: [id number]     Arrival date & time 06/19/15  1358 History   First MD Initiated Contact with Patient 06/19/15 2131     Chief Complaint  Patient presents with  . Aphasia     (Consider location/radiation/quality/duration/timing/severity/associated sxs/prior Treatment) Patient is a 82 y.o. male presenting with Acute Neurological Problem. The history is provided by the patient.  Cerebrovascular Accident This is a new problem. The current episode started today. The problem occurs rarely. The problem has been resolved. Pertinent negatives include no abdominal pain, chest pain, coughing, fever, headaches, nausea, visual change or vomiting. Nothing aggravates the symptoms. He has tried nothing for the symptoms. The treatment provided no relief.    Past Medical History  Diagnosis Date  . Glaucoma     bilateral --  right uses rx drops and left eye trabeculectomy 2011  . Arthritis   . Wears glasses   . Complication of anesthesia     POST URINARY RETENTION  . BPH (benign prostatic hypertrophy)   . History of ulcerative colitis     in remission  . Wears glasses   . Hypothyroidism   . Hyperlipidemia   . History of renal cell cancer     S/P  LEFT NEPHRECTOMY  2003--  no recurrence  . History of gastroesophageal reflux (GERD)   . Lower leg pain     pt states has fibula-tibula syndrome--  goes to physical therapy  . Wears glasses   . ED (erectile dysfunction) of organic origin   . Rosacea   . Keratosis, actinic    Past Surgical History  Procedure Laterality Date  . Trabeculectomy  2011    left eye (glaucoma)  . Shoulder arthroscopy Left LEFT  2012  . Colonoscopy  FEB 2015  . Orif clavicular fracture  12/03/2011    Procedure: OPEN REDUCTION INTERNAL FIXATION (ORIF) CLAVICULAR FRACTURE;  Surgeon: [name], MD;  Location: [facility];  Service: Orthopedics;  Laterality: Right;  . Nephrectomy Left 2003  . Lumbar laminectomy  1990  . Tonsillectomy  as child   . Cataract extraction w/ intraocular lens  implant, bilateral    . Green light laser turp (transurethral resection of prostate N/A 08/10/2013    Procedure: GREEN LIGHT LASER TURP (TRANSURETHRAL RESECTION OF PROSTATE;  Surgeon: [name], MD;  Location: [facility];  Service: Urology;  Laterality: N/A;   History reviewed. No pertinent family history. Social History  Substance Use Topics  . Smoking status: Former Smoker -- 6 years    Types: Cigarettes    Quit date: 12/01/1953  . Smokeless tobacco: Never Used  . Alcohol Use: 4.2 oz/week    7 Glasses of wine per week     Comment: one wine daily    Review of Systems  Constitutional: Negative for fever.  HENT: Negative.   Eyes: Negative for visual disturbance.  Respiratory: Negative for cough.   Cardiovascular: Negative for chest pain.  Gastrointestinal: Negative for nausea, vomiting and abdominal pain.  Genitourinary: Negative.   Musculoskeletal: Negative.   Skin: Negative.   Neurological: Positive for speech difficulty. Negative for headaches.      Allergies  Lipitor and Sporanox  Home Medications   Prior to Admission medications   Medication Sig Start Date End Date Taking? Authorizing Provider  aspirin 81 MG tablet Take 1 tablet (81 mg total) by mouth daily. Patient taking differently: Take 40.5 mg by mouth daily.  08/13/13  Yes Festus Aloe, MD  [facility]prine [facility]) 50  MG tablet Take 75 mg by mouth every morning. Takes 1  1/2  TABS   Yes Historical Provider, MD  brimonidine (ALPHAGAN) 0.2 % ophthalmic solution Place 1 drop into both eyes 2 (two) times daily.   Yes Historical Provider, MD  calcium carbonate (OS-CAL) 600 MG TABS Take 600 mg by mouth daily with breakfast.    Yes Historical Provider, MD  Cholecalciferol (VITAMIN D3) 1000 UNITS CAPS Take 2 capsules by mouth daily.    Yes Historical Provider, MD  Coenzyme Q10 (COQ10) 200 MG CAPS Take 1 capsule by mouth daily.   Yes Historical  Provider, MD  fish oil-omega-3 fatty acids 1000 MG capsule Take 1 g by mouth 2 (two) times daily.    Yes Historical Provider, MD  fluoruracil (CARAC) 0.5 % cream Apply 1 application topically 2 (two) times daily.    Yes Historical Provider, MD  Ginkgo Biloba 40 MG TABS Take 120 mg by mouth daily.    Yes Historical Provider, MD  latanoprost (XALATAN) 0.005 % ophthalmic solution Place 1 drop into the right eye at bedtime.   Yes Historical Provider, MD  levothyroxine (SYNTHROID, LEVOTHROID) 50 MCG tablet Take 50-100 mcg by mouth daily before breakfast. Take 47mcg everyday except Wednesday and Sunday take 175mcg.   Yes Historical Provider, MD  mesalamine (APRISO) 0.375 G 24 hr capsule Take 2,250 mg by mouth daily. TAKES 6 TABS DAILY--  TOTAL 2250MG    Yes Historical Provider, MD  mesalamine (CANASA) 1000 MG suppository Place 1,000 mg rectally at bedtime.   Yes Historical Provider, MD  metroNIDAZOLE (METROGEL) 0.75 % gel Apply 1 application topically 2 (two) times daily as needed (Rosacea).    Yes Historical Provider, MD  Multiple Vitamins-Minerals (EQ COMPLETE MULTIVIT ADULT 50+ PO) Take 1 tablet by mouth daily.   Yes Historical Provider, MD  niacin (SLO-NIACIN) 500 MG tablet Take 500 mg by mouth at bedtime.   Yes Historical Provider, MD  sildenafil (VIAGRA) 100 MG tablet Take 50 mg by mouth daily as needed for erectile dysfunction.   Yes Historical Provider, MD  simvastatin (ZOCOR) 20 MG tablet Take 10 mg by mouth every other day. TAKES QHS   Yes Historical Provider, MD  timolol (BETIMOL) 0.5 % ophthalmic solution Place 1 drop into the right eye 2 (two) times daily.   Yes Historical Provider, MD  timolol (TIMOPTIC) 0.5 % ophthalmic solution Place 1 drop into both eyes 2 (two) times daily.   Yes Historical Provider, MD  urea (CARMOL) 40 % CREA Apply 20 mg topically daily.    Yes Historical Provider, MD   BP 189/90 mmHg  Pulse 52  Temp(Src) 97.9 F (36.6 C) (Oral)  Resp 16  SpO2 99% Physical Exam   Constitutional: He is oriented to person, place, and time. He appears well-developed and well-nourished. No distress.  HENT:  Head: Normocephalic and atraumatic.  Mouth/Throat: No oropharyngeal exudate.  Eyes: EOM are normal. Pupils are equal, round, and reactive to light. No scleral icterus.  Neck: Normal range of motion. Neck supple.  Cardiovascular: Normal rate, regular rhythm and intact distal pulses.   Pulmonary/Chest: Effort normal and breath sounds normal. No respiratory distress. He exhibits no tenderness.  Abdominal: Soft. He exhibits no distension. There is no tenderness. There is no rebound and no guarding.  Musculoskeletal: Normal range of motion. He exhibits no edema or tenderness.  Neurological: He is alert and oriented to person, place, and time. No cranial nerve deficit or sensory deficit. He exhibits normal muscle tone. Coordination normal.  Skin: Skin is warm and dry. No rash noted. He is not diaphoretic. No erythema. No pallor.  Psychiatric: He has a normal mood and affect.  Nursing note and vitals reviewed.   ED Course  Procedures (including critical care time) Labs Review Labs Reviewed  CBC - Abnormal; Notable for the following:    Platelets 103 (*)    All other components within normal limits  COMPREHENSIVE METABOLIC PANEL - Abnormal; Notable for the following:    Glucose, Bld 119 (*)    Creatinine, Ser 1.53 (*)    Alkaline Phosphatase 36 (*)    GFR calc non Af Amer 41 (*)    GFR calc Af Amer 48 (*)    All other components within normal limits  PROTIME-INR  APTT  DIFFERENTIAL  CREATININE, URINE, RANDOM  SODIUM, URINE, RANDOM  TSH  HEMOGLOBIN A1C  LIPID PANEL  I-STAT TROPOININ, ED    Imaging Review Ct Head Wo Contrast  06/19/2015  CLINICAL DATA:  Difficulty speaking today at 1:30 p.m. EXAM: CT HEAD WITHOUT CONTRAST TECHNIQUE: Contiguous axial images were obtained from the base of the skull through the vertex without intravenous contrast. COMPARISON:   None. FINDINGS: There is atrophy and chronic small vessel disease changes. No acute intracranial abnormality. Specifically, no hemorrhage, hydrocephalus, mass lesion, acute infarction, or significant intracranial injury. No acute calvarial abnormality. Left sphenoid sinus is completely opacified. Remainder the paranasal sinuses and mastoid air cells are clear. IMPRESSION: No acute intracranial abnormality. Atrophy, chronic microvascular disease. Electronically Signed   By: Rolm Baptise M.D.   On: 06/19/2015 15:37   I have personally reviewed and evaluated these images and lab results as part of my medical decision-making.   EKG Interpretation None      MDM   Final diagnoses:  Transient cerebral ischemia, unspecified transient cerebral ischemia type  AKI (acute kidney injury) (HCC)  TIA (transient ischemic attack)  TIA (transient ischemic attack)  TIA (transient ischemic attack)    Patient is a 82 year old male with a history of hyperlipidemia, hypertension who presents with acute onset of expressive aphasia that started around 1 PM that lasted roughly 40 minutes. He is otherwise been in his usual state of health and is currently back to his baseline. No history of stroke or TIAs in the past. Further history and exam as above notable for stable vital signs and reassuring neuro exam. CT without acute intracranial abnormalities EKG sinus rhythm and blood work unremarkable. Concern for TIA at this time. Neurology consults it and patient will be admitted to hospitalist for further management and evaluation.    Heriberto Antigua, MD 06/20/15 [id number]  Wandra Arthurs, MD 06/20/15 [phone]

## 2015-06-19 NOTE — ED Notes (Signed)
Pt reassessed. No complaints.

## 2015-06-19 NOTE — ED Notes (Signed)
Pt reports about 130pm he was coming out of a restaurant and began having trouble with his words. States this episode lasted about 25 minutes. Pt reports he feels normal at triage, no trouble with his words. No neuro deficits noted.

## 2015-06-19 NOTE — H&P (Addendum)
[facility] History and Physical  Joseph Hanna TDS:[id number] DOB: 08/11/1932 DOA: 06/19/2015  Referring physician: ED physician PCP: Jerlyn Ly, MD  Specialists:   Chief Complaint: Difficulty speaking  HPI: Joseph Hanna is a 82 y.o. male with PMH of hyperlipidemia, GERD, hypothyroidism, left renal cell cancer (S/P of ephrectory 2003), BPH, ulcerative colitis, who presents with difficulty speaking.  Patient reports that he had one episode of difficulty speaking at about 1:30 PM, when he was coming out of a restaurant. He had trouble with his words. States this episode lasted about 40 minutes, then resolved spontaneously. Patient did not have unilateral weakness, numbness or tingling sensations. No vision change or hearing loss. Patient did not have chest pain, shortness of breath, cough, abdominal pain, diarrhea, symptoms of a UTI. Currently he feels normal.  In ED, patient was found to have negative CT head for acute abnormalities, WBC 4.3, platelets 103, temperature normal, acute renal injury. Patient is admitted to inpatient for further eval and treatment. Neurology was consulted by EDP.  EKG: Independently reviewed. QTC 433, minimal ST depression in inferior leads.   Where does patient live?   At home    Can patient participate in ADLs? Some   Review of Systems:   General: no fevers, chills, no changes in body weight, has fatigue HEENT: no blurry vision, hearing changes or sore throat Pulm: no dyspnea, coughing, wheezing CV: no chest pain, palpitations Abd: no nausea, vomiting, abdominal pain, diarrhea, constipation GU: no dysuria, burning on urination, increased urinary frequency, hematuria  Ext: no leg edema Neuro: no unilateral weakness, numbness, or tingling, no vision change or hearing loss. Had episode of difficulty speaking. Skin: no rash MSK: No muscle spasm, no deformity, no limitation of range of movement in spin Heme: No easy bruising.  Travel history: No  recent long distant travel.  Allergy:  Allergies  Allergen Reactions  . Lipitor [Atorvastatin] Other (See Comments)    "severe muscle aches"  . Sporanox [Itraconazole] Rash    Past Medical History  Diagnosis Date  . Glaucoma     bilateral --  right uses rx drops and left eye trabeculectomy 2011  . Arthritis   . Wears glasses   . Complication of anesthesia     POST URINARY RETENTION  . BPH (benign prostatic hypertrophy)   . History of ulcerative colitis     in remission  . Wears glasses   . Hypothyroidism   . Hyperlipidemia   . History of renal cell cancer     S/P  LEFT NEPHRECTOMY  2003--  no recurrence  . History of gastroesophageal reflux (GERD)   . Lower leg pain     pt states has fibula-tibula syndrome--  goes to physical therapy  . Wears glasses   . ED (erectile dysfunction) of organic origin   . Rosacea   . Keratosis, actinic     Past Surgical History  Procedure Laterality Date  . Trabeculectomy  2011    left eye (glaucoma)  . Shoulder arthroscopy Left LEFT  2012  . Colonoscopy  FEB 2015  . Orif clavicular fracture  12/03/2011    Procedure: OPEN REDUCTION INTERNAL FIXATION (ORIF) CLAVICULAR FRACTURE;  Surgeon: [name], MD;  Location: [place];  Service: Orthopedics;  Laterality: Right;  . Nephrectomy Left 2003  . Lumbar laminectomy  1990  . Tonsillectomy  as child  . Cataract extraction w/ intraocular lens  implant, bilateral    . Green light laser turp (transurethral resection  of prostate N/A 08/10/2013    Procedure: GREEN LIGHT LASER TURP (TRANSURETHRAL RESECTION OF PROSTATE;  Surgeon: [name], MD;  Location: [facility];  Service: Urology;  Laterality: N/A;    Social History:  reports that he quit smoking about 61 years ago. His smoking use included Cigarettes. He quit after 6 years of use. He has never used smokeless tobacco. He reports that he drinks about 4.2 oz of alcohol per week. He reports  that he does not use illicit drugs.  Family History:  Family History  Problem Relation Age of Onset  . Breast cancer Mother   . Emphysema Father   . Alzheimer's disease Brother      Prior to Admission medications   Medication Sig Start Date End Date Taking? Authorizing Provider  aspirin 81 MG tablet Take 1 tablet (81 mg total) by mouth daily. 08/13/13   Festus Aloe, MD  azaTHIOprine (IMURAN) 50 MG tablet Take 75 mg by mouth daily. Takes 1  1/2  TABS    Historical Provider, MD  calcium carbonate (OS-CAL) 600 MG TABS Take 600 mg by mouth 2 (two) times daily with a meal.    Historical Provider, MD  cephALEXin (KEFLEX) 500 MG capsule Take 1 capsule (500 mg total) by mouth 2 (two) times daily. 08/10/13   Festus Aloe, MD  Cholecalciferol (VITAMIN D3) 1000 UNITS CAPS Take 1 capsule by mouth daily.    Historical Provider, MD  Coenzyme Q10 (COQ10) 200 MG CAPS Take 1 capsule by mouth daily.    Historical Provider, MD  dorzolamide (TRUSOPT) 2 % ophthalmic solution Place 1 drop into the right eye 3 (three) times daily.    Historical Provider, MD  finasteride (PROSCAR) 5 MG tablet Take 5 mg by mouth daily.    Historical Provider, MD  fish oil-omega-3 fatty acids 1000 MG capsule Take 2 g by mouth daily.    Historical Provider, MD  fluoruracil [facility]) 0.5 % cream Apply topically daily.    Historical Provider, MD  Ginkgo Biloba 40 MG TABS Take 1 tablet by mouth daily.    Historical Provider, MD  ketoconazole (NIZORAL) 2 % cream Apply 1 application topically daily.    Historical Provider, MD  latanoprost (XALATAN) 0.005 % ophthalmic solution Place 1 drop into the right eye at bedtime.    Historical Provider, MD  levothyroxine (SYNTHROID, LEVOTHROID) 50 MCG tablet Take 50 mcg by mouth daily before breakfast.    Historical Provider, MD  mesalamine (APRISO) 0.375 G 24 hr capsule Take 2,250 mg by mouth daily. TAKES 6 TABS DAILY--  TOTAL 2250MG    Historical Provider, MD  mesalamine (CANASA) 1000 MG  suppository Place 1,000 mg rectally at bedtime.    Historical Provider, MD  metroNIDAZOLE (METROGEL) 0.75 % gel Apply 1 application topically 2 (two) times daily as needed.    Historical Provider, MD  Multiple Vitamins-Minerals (EQ COMPLETE MULTIVIT ADULT 50+ PO) Take 1 tablet by mouth daily.    Historical Provider, MD  niacin (SLO-NIACIN) 500 MG tablet Take 500 mg by mouth at bedtime.    Historical Provider, MD  oxyCODONE-acetaminophen (ROXICET) 5-325 MG per tablet Take 1-2 tablets by mouth every 4 (four) hours as needed for moderate pain. 08/10/13   Festus Aloe, MD  sildenafil (VIAGRA) 100 MG tablet Take 50 mg by mouth daily as needed for erectile dysfunction.    Historical Provider, MD  simvastatin (ZOCOR) 20 MG tablet Take 10 mg by mouth every other day. TAKES QHS  Historical Provider, MD  Tamsulosin HCl (FLOMAX) 0.4 MG CAPS Take 0.4 mg by mouth daily after supper.     Historical Provider, MD  timolol (BETIMOL) 0.5 % ophthalmic solution Place 1 drop into the right eye 2 (two) times daily.    Historical Provider, MD  urea (CARMOL) 40 % CREA Apply 40 mg topically daily.    Historical Provider, MD    Physical Exam: Filed Vitals:   06/19/15 2200 06/19/15 2230 06/19/15 2249 06/20/15 0045  BP: 166/77 189/90  169/69  Pulse: 53 52  58  Temp:   97.9 F (36.6 C) 98.1 F (36.7 C)  TempSrc:    Oral  Resp: 14 16    Height:    6' 2"  (1.88 m)  Weight:    81.239 kg (179 lb 1.6 oz)  SpO2: 100% 99%  97%   General: Not in acute distress HEENT:       Eyes: PERRL, EOMI, no scleral icterus.       ENT: No discharge from the ears and nose, no pharynx injection, no tonsillar enlargement.        Neck: No JVD, no bruit, no mass felt. Heme: No neck lymph node enlargement. Cardiac: S1/S2, RRR, No murmurs, No gallops or rubs. Pulm: Good air movement bilaterally. No rales, wheezing, rhonchi or rubs. Abd: Soft, nondistended, nontender, no rebound pain, no organomegaly, BS present. Ext: No pitting leg  edema bilaterally. 2+DP/PT pulse bilaterally. Musculoskeletal: No joint deformities, No joint redness or warmth, no limitation of ROM in spin. Skin: No rashes.  Neuro: Alert, oriented X3, cranial nerves II-XII grossly intact, muscle strength 5/5 in all extremities, sensation to light touch intact. Knee reflex 1+ bilaterally. Negative Babinski's sign. Normal finger to nose test. Psych: Patient is not psychotic, no suicidal or hemocidal ideation.  Labs on Admission:  Basic Metabolic Panel:  Recent Labs Lab 06/19/15 1517  NA 142  K 4.3  CL 101  CO2 29  GLUCOSE 119*  BUN 19  CREATININE 1.53*  CALCIUM 9.6   Liver Function Tests:  Recent Labs Lab 06/19/15 1517  AST 26  ALT 21  ALKPHOS 36*  BILITOT 0.9  PROT 6.6  ALBUMIN 4.2   No results for input(s): LIPASE, AMYLASE in the last 168 hours. No results for input(s): AMMONIA in the last 168 hours. CBC:  Recent Labs Lab 06/19/15 1517  WBC 4.3  NEUTROABS 2.2  HGB 13.5  HCT 40.8  MCV 94.7  PLT 103*   Cardiac Enzymes: No results for input(s): CKTOTAL, CKMB, CKMBINDEX, TROPONINI in the last 168 hours.  BNP (last 3 results) No results for input(s): BNP in the last 8760 hours.  ProBNP (last 3 results) No results for input(s): PROBNP in the last 8760 hours.  CBG: No results for input(s): GLUCAP in the last 168 hours.  Radiological Exams on Admission: Ct Head Wo Contrast  06/19/2015  CLINICAL DATA:  Difficulty speaking today at 1:30 p.m. EXAM: CT HEAD WITHOUT CONTRAST TECHNIQUE: Contiguous axial images were obtained from the base of the skull through the vertex without intravenous contrast. COMPARISON:  None. FINDINGS: There is atrophy and chronic small vessel disease changes. No acute intracranial abnormality. Specifically, no hemorrhage, hydrocephalus, mass lesion, acute infarction, or significant intracranial injury. No acute calvarial abnormality. Left sphenoid sinus is completely opacified. Remainder the paranasal  sinuses and mastoid air cells are clear. IMPRESSION: No acute intracranial abnormality. Atrophy, chronic microvascular disease. Electronically Signed   By: Rolm Baptise M.D.   On: 06/19/2015 15:37  Mr Brain Wo Contrast  06/20/2015  CLINICAL DATA:  25 minute episode of speech difficulties beginning at 1:30 p.m. today while coming out of the restaurant. The no at baseline. No neuro deficits. History of hyperlipidemia. EXAM: MRI HEAD WITHOUT CONTRAST MRA HEAD WITHOUT CONTRAST TECHNIQUE: Multiplanar, multiecho pulse sequences of the brain and surrounding structures were obtained without intravenous contrast. Angiographic images of the head were obtained using MRA technique without contrast. COMPARISON:  CT head February 2017 at 1527 hours FINDINGS: MRI HEAD FINDINGS The ventricles and sulci are normal for patient's age. No abnormal parenchymal signal, mass lesions, mass effect. No reduced diffusion to suggest acute ischemia. Minimal white matter changes most compatible chronic small vessel ischemic disease. No susceptibility artifact to suggest hemorrhage. No abnormal extra-axial fluid collections. No extra-axial masses though, contrast enhanced sequences would be more sensitive. Normal major intracranial vascular flow voids seen at the skull base. Status post bilateral ocular lens implants. No abnormal sellar expansion. No suspicious calvarial bone marrow signal. Craniocervical junction maintained. Inspissated LEFT sphenoid sinusitis versus mucosal retention cyst. Mild paranasal sinus mucosal thickening. Mastoid air cells are well aerated. MRA HEAD FINDINGS Anterior circulation: Normal flow related enhancement of the included cervical, petrous, cavernous and supraclinoid internal carotid arteries. Patent anterior communicating artery. Normal flow related enhancement of the anterior and middle cerebral arteries, including distal segments. No large vessel occlusion, high-grade stenosis, abnormal luminal irregularity,  aneurysm. Posterior circulation: RIGHT vertebral artery is dominant. Basilar artery is patent, with normal flow related enhancement of the main branch vessels. Normal flow related enhancement of the posterior cerebral arteries. No large vessel occlusion, high-grade stenosis, abnormal luminal irregularity, aneurysm. IMPRESSION: Normal MRI brain without contrast for age. Normal MRA head. Electronically Signed   By: Elon Alas M.D.   On: 06/20/2015 00:43   US Renal  06/20/2015  CLINICAL DATA:  Acute onset of renal insufficiency. Initial encounter. EXAM: RENAL / URINARY TRACT ULTRASOUND COMPLETE COMPARISON:  Renal ultrasound performed 08/12/2007, and CT of the abdomen and pelvis performed 09/20/2008 FINDINGS: Right Kidney: Length: 12.4 cm. Echogenicity within normal limits. No mass or hydronephrosis visualized. Left Kidney: Status post left-sided nephrectomy. Bladder: Appears normal for degree of bladder distention. The prostate is enlarged, measuring 5.6 cm in transverse dimension. IMPRESSION: 1. No evidence of hydronephrosis. Right kidney is unremarkable in appearance. Status post left-sided nephrectomy. 2. Enlarged prostate noted. Electronically Signed   By: Garald Balding M.D.   On: 06/20/2015 00:35   Mr Jodene Nam Head/brain Wo Cm  06/20/2015  CLINICAL DATA:  25 minute episode of speech difficulties beginning at 1:30 p.m. today while coming out of the restaurant. The no at baseline. No neuro deficits. History of hyperlipidemia. EXAM: MRI HEAD WITHOUT CONTRAST MRA HEAD WITHOUT CONTRAST TECHNIQUE: Multiplanar, multiecho pulse sequences of the brain and surrounding structures were obtained without intravenous contrast. Angiographic images of the head were obtained using MRA technique without contrast. COMPARISON:  CT head February 2017 at 1527 hours FINDINGS: MRI HEAD FINDINGS The ventricles and sulci are normal for patient's age. No abnormal parenchymal signal, mass lesions, mass effect. No reduced diffusion to  suggest acute ischemia. Minimal white matter changes most compatible chronic small vessel ischemic disease. No susceptibility artifact to suggest hemorrhage. No abnormal extra-axial fluid collections. No extra-axial masses though, contrast enhanced sequences would be more sensitive. Normal major intracranial vascular flow voids seen at the skull base. Status post bilateral ocular lens implants. No abnormal sellar expansion. No suspicious calvarial bone marrow signal. Craniocervical junction maintained. Inspissated LEFT sphenoid  sinusitis versus mucosal retention cyst. Mild paranasal sinus mucosal thickening. Mastoid air cells are well aerated. MRA HEAD FINDINGS Anterior circulation: Normal flow related enhancement of the included cervical, petrous, cavernous and supraclinoid internal carotid arteries. Patent anterior communicating artery. Normal flow related enhancement of the anterior and middle cerebral arteries, including distal segments. No large vessel occlusion, high-grade stenosis, abnormal luminal irregularity, aneurysm. Posterior circulation: RIGHT vertebral artery is dominant. Basilar artery is patent, with normal flow related enhancement of the main branch vessels. Normal flow related enhancement of the posterior cerebral arteries. No large vessel occlusion, high-grade stenosis, abnormal luminal irregularity, aneurysm. IMPRESSION: Normal MRI brain without contrast for age. Normal MRA head. Electronically Signed   By: Elon Alas M.D.   On: 06/20/2015 00:43    Assessment/Plan Principal Problem:   Difficulty speaking Active Problems:   BPH (benign prostatic hypertrophy)   History of ulcerative colitis   Hypothyroidism   Hyperlipidemia   History of renal cell cancer   History of gastroesophageal reflux (GERD)   TIA (transient ischemic attack)   Thrombocytasthenia ([place])   CKD (chronic kidney disease)   Difficulty speaking: Patient symptoms of one episode of difficulty speaking is  concerning for TIA. Patient is immunosuppressed, but no signs of infection. CT-head is negative for acute abnormalities. Neurology was consulted.  - will admit to tele bed - F/u neurologist follow-up recommendations - Risk factor modification: HgbA1c, fasting lipid panel  - MRI, MRA of the brain without contrast  - PT consult, OT consult, Speech consult  - 2 d Echocardiogram  - Ekg  - Carotid dopplers  - Aspirin 81 mg daily  BPH: stable - Continue Proscar and flomax  History of ulcerative colitis: pt is on Imuran and mesalamine (oral and suppository). Symptoms well controlled. No abdominal pain or diarrhea. -Continue home medications  HLD: Last LDL was not on record -Continue home medications: Zocor -Check FLP  GERD: -Protonix  Thrombocytasthenia ([place]): Platelet is 103. Etiology is not clear. May be related to Imuran use. No bleeding tendency. -Follow-up with a CBC  CKD vs AKi: his creatinine is 1.53. No baseline creatinine on record. Since patient has history of left nephrectomy due to renal cell cancer, this is more likely due to CKD.  - IVF : NS 75 cc/h - Check FeUrea - US-renal - Follow up renal function by BMP - Avoid ACEI and NSAIDs  Hypothyroidism: Last TSH was not on record -Continue home Synthroid -Check TSH  Glaucoma: -allow patient to use home eyedrops   DVT ppx:  SQ Lovenox  Code Status: Full code Family Communication:  Yes, patient's wife at bed side Disposition Plan: Admit to inpatient   Date of Service 06/20/2015    Ivor Costa [facility] Pager [phone]  If 7PM-7AM, please contact night-coverage [email or web address] Password [email or web address] 06/20/2015, 2:04 AM

## 2015-06-19 NOTE — ED Notes (Signed)
Patient transported to MRI 

## 2015-06-19 NOTE — ED Notes (Signed)
Neuro checks piror to pt being placed in room not completed by this rn

## 2015-06-19 NOTE — ED Notes (Signed)
9m 20

## 2015-06-20 ENCOUNTER — Ambulatory Visit (HOSPITAL_COMMUNITY): Payer: Medicare HMO

## 2015-06-20 ENCOUNTER — Encounter (HOSPITAL_COMMUNITY): Payer: Self-pay | Admitting: Internal Medicine

## 2015-06-20 ENCOUNTER — Observation Stay (HOSPITAL_BASED_OUTPATIENT_CLINIC_OR_DEPARTMENT_OTHER): Payer: Medicare HMO

## 2015-06-20 ENCOUNTER — Ambulatory Visit (HOSPITAL_BASED_OUTPATIENT_CLINIC_OR_DEPARTMENT_OTHER): Payer: Medicare HMO

## 2015-06-20 DIAGNOSIS — R490 Dysphonia: Secondary | ICD-10-CM

## 2015-06-20 DIAGNOSIS — N289 Disorder of kidney and ureter, unspecified: Secondary | ICD-10-CM | POA: Diagnosis not present

## 2015-06-20 DIAGNOSIS — G459 Transient cerebral ischemic attack, unspecified: Secondary | ICD-10-CM

## 2015-06-20 DIAGNOSIS — G451 Carotid artery syndrome (hemispheric): Secondary | ICD-10-CM | POA: Diagnosis not present

## 2015-06-20 DIAGNOSIS — R4789 Other speech disturbances: Secondary | ICD-10-CM | POA: Diagnosis not present

## 2015-06-20 HISTORY — DX: Transient cerebral ischemic attack, unspecified: G45.9

## 2015-06-20 LAB — LIPID PANEL
Cholesterol: 126 mg/dL (ref 0–200)
HDL: 30 mg/dL — ABNORMAL LOW (ref >40)
LDL Cholesterol: 66 mg/dL (ref 0–99)
Total CHOL/HDL Ratio: 4.2 RATIO
Triglycerides: 152 mg/dL — ABNORMAL HIGH (ref <150)
VLDL: 30 mg/dL (ref 0–40)

## 2015-06-20 LAB — GLUCOSE, CAPILLARY: Glucose-Capillary: 99 mg/dL (ref 65–99)

## 2015-06-20 LAB — TSH: TSH: 5.055 u[IU]/mL — ABNORMAL HIGH (ref 0.350–4.500)

## 2015-06-20 MED ORDER — ASPIRIN 81 MG PO TABS: 81.0000 mg | ORAL_TABLET | Freq: Every day | ORAL | Status: DC

## 2015-06-20 MED ORDER — LEVOTHYROXINE SODIUM 50 MCG PO TABS
50.0000 ug | ORAL_TABLET | ORAL | Status: DC
  Administered 2015-06-20: 50 ug via ORAL
  Filled 2015-06-20: qty 1

## 2015-06-20 MED ORDER — LEVOTHYROXINE SODIUM 100 MCG PO TABS: 100.0000 ug | ORAL_TABLET | ORAL | Status: DC

## 2015-06-20 NOTE — Progress Notes (Signed)
OT Cancellation Note and Discharge  Patient Details Name: ONECIMO PURCHASE MRN: [id number] DOB: 1932-09-28   Cancelled Treatment:    Reason Eval/Treat Not Completed: OT screened, no needs identified, will sign off. Spoke with PT who has evaled pt and pt is completely independent and does not need OT.  Almon Register [id number] 06/20/2015, 2:40 PM

## 2015-06-20 NOTE — Evaluation (Signed)
Physical Therapy Evaluation and Discharge Patient Details Name: Joseph Hanna MRN: [id number] DOB: [birth year] Today's Date: 06/20/2015   History of Present Illness  Presented with episodes of difficulty speaking (over several hours); MRI negative; PMHx-back surgery with residual numbness Lt foot  Clinical Impression  Patient evaluated by Physical Therapy with no further acute PT needs identified. All education has been completed and the patient has no further questions. PT is signing off. Thank you for this referral    Follow Up Recommendations No PT follow up    Equipment Recommendations  None recommended by PT    Recommendations for Other Services       Precautions / Restrictions Precautions Precautions: None      Mobility  Bed Mobility Overal bed mobility: Independent                Transfers Overall transfer level: Independent                  Ambulation/Gait Ambulation/Gait assistance: Independent Ambulation Distance (Feet): 250 Feet Assistive device: None Gait Pattern/deviations: WFL(Within Functional Limits)   Gait velocity interpretation: at or above normal speed for age/gender General Gait Details: able to vary speed up/down, head turns, direction changes  Stairs            Wheelchair Mobility    Modified Rankin (Stroke Patients Only) Modified Rankin (Stroke Patients Only) Pre-Morbid Rankin Score: No symptoms Modified Rankin: No symptoms     Balance Overall balance assessment: Independent                               Standardized Balance Assessment Standardized Balance Assessment : Berg Balance Test Berg Balance Test Sit to Stand: Able to stand without using hands and stabilize independently Standing Unsupported: Able to stand safely 2 minutes Sitting with Back Unsupported but Feet Supported on Floor or Stool: Able to sit safely and securely 2 minutes Stand to Sit: Sits safely with minimal use of hands Transfers:  Able to transfer safely, minor use of hands Standing Unsupported with Eyes Closed: Able to stand 10 seconds safely Standing Ubsupported with Feet Together: Able to place feet together independently and stand 1 minute safely From Standing, Reach Forward with Outstretched Arm: Can reach confidently >25 cm (10") From Standing Position, Pick up Object from Floor: Able to pick up shoe safely and easily From Standing Position, Turn to Look Behind Over each Shoulder: Looks behind from both sides and weight shifts well Turn 360 Degrees: Able to turn 360 degrees safely in 4 seconds or less Standing Unsupported, Alternately Place Feet on Step/Stool: Able to stand independently and safely and complete 8 steps in 20 seconds Standing Unsupported, One Foot in Front: Able to plae foot ahead of the other independently and hold 30 seconds Standing on One Leg: Able to lift leg independently and hold > 10 seconds Total Score: 55         Pertinent Vitals/Pain Pain Assessment: No/denies pain    Home Living Family/patient expects to be discharged to:: Private residence Living Arrangements: Spouse/significant other Available Help at Discharge: Family;Available 24 hours/day Type of Home: House Home Access: Stairs to enter Entrance Stairs-Rails: Left Entrance Stairs-Number of Steps: 3 Home Layout: Two level;Able to live on main level with bedroom/bathroom Home Equipment: Walker - 2 wheels      Prior Function Level of Independence: Independent  Hand Dominance        Extremity/Trunk Assessment   Upper Extremity Assessment: Overall WFL for tasks assessed           Lower Extremity Assessment: Overall WFL for tasks assessed      Cervical / Trunk Assessment: Normal  Communication   Communication: No difficulties  Cognition Arousal/Alertness: Awake/alert Behavior During Therapy: WFL for tasks assessed/performed Overall Cognitive Status: Within Functional Limits for tasks  assessed                      General Comments General comments (skin integrity, edema, etc.): Reported he at times "shuffles" his feet when walking with slight tripping (has never fallen). Discussed shoe wear and he tends to wear "mule" style shoes with no back to shoe. Educated this type shoe may be the cause    Exercises        Assessment/Plan    PT Assessment Patent does not need any further PT services  PT Diagnosis Difficulty walking   PT Problem List    PT Treatment Interventions     PT Goals (Current goals can be found in the Care Plan section) Acute Rehab PT Goals PT Goal Formulation: All assessment and education complete, DC therapy    Frequency     Barriers to discharge        Co-evaluation               End of Session Equipment Utilized During Treatment: Gait belt Activity Tolerance: Patient tolerated treatment well Patient left: in chair;with call bell/phone within reach;with family/visitor present      Functional Assessment Tool Used: clinical judgement Functional Limitation: Mobility: Walking and moving around Mobility: Walking and Moving Around Current Status [phone]): 0 percent impaired, limited or restricted Mobility: Walking and Moving Around Goal Status [phone]): 0 percent impaired, limited or restricted Mobility: Walking and Moving Around Discharge Status [phone]): 0 percent impaired, limited or restricted    Time: 9295-7473 PT Time Calculation (min) (ACUTE ONLY): 24 min   Charges:   PT Evaluation $PT Eval Low Complexity: 1 Procedure PT Treatments $Gait Training: 8-22 mins   PT G Codes:   PT G-Codes **NOT FOR INPATIENT CLASS** Functional Assessment Tool Used: clinical judgement Functional Limitation: Mobility: Walking and moving around Mobility: Walking and Moving Around Current Status (U0370): 0 percent impaired, limited or restricted Mobility: Walking and Moving Around Goal Status (D6438): 0 percent impaired, limited or  restricted Mobility: Walking and Moving Around Discharge Status (V8184): 0 percent impaired, limited or restricted    Kimeka Badour 06/20/2015, 2:47 PM Pager [phone]

## 2015-06-20 NOTE — Discharge Summary (Signed)
Joseph Hanna, is a 82 y.o. male  DOB 1933-03-31  MRN [id number].  Admission date:  06/19/2015  Admitting Physician  Ivor Costa, MD  Discharge Date:  06/20/2015   Primary MD  Jerlyn Ly, MD  Recommendations for primary care physician for things to follow:   Monitor secondary to his factors for stroke, check BMP in 5-7 days   Admission Diagnosis  TIA (transient ischemic attack) [G45.9] Hyperlipidemia [E78.5] BPH (benign prostatic hypertrophy) [N40.0] History of gastroesophageal reflux (GERD) [Z87.19] History of ulcerative colitis [Z87.19] AKI (acute kidney injury) ([place]) [N17.9] History of renal cell cancer [Z85.528] Transient cerebral ischemia, unspecified transient cerebral ischemia type [G45.9] Hypothyroidism, unspecified hypothyroidism type [E03.9]   Discharge Diagnosis  TIA (transient ischemic attack) [G45.9] Hyperlipidemia [E78.5] BPH (benign prostatic hypertrophy) [N40.0] History of gastroesophageal reflux (GERD) [Z87.19] History of ulcerative colitis [Z87.19] AKI (acute kidney injury) ([place]) [N17.9] History of renal cell cancer [Z85.528] Transient cerebral ischemia, unspecified transient cerebral ischemia type [G45.9] Hypothyroidism, unspecified hypothyroidism type [E03.9]    Principal Problem:   Difficulty speaking Active Problems:   BPH (benign prostatic hypertrophy)   History of ulcerative colitis   Hypothyroidism   Hyperlipidemia   History of renal cell cancer   History of gastroesophageal reflux (GERD)   TIA (transient ischemic attack)   Thrombocytasthenia (HCC)   CKD (chronic kidney disease)      Past Medical History  Diagnosis Date  . Glaucoma     bilateral --  right uses rx drops and left eye trabeculectomy 2011  . Arthritis   . Wears glasses   . Complication of anesthesia    POST URINARY RETENTION  . BPH (benign prostatic hypertrophy)   . History of ulcerative colitis     in remission  . Wears glasses   . Hypothyroidism   . Hyperlipidemia   . History of renal cell cancer     S/P  LEFT NEPHRECTOMY  2003--  no recurrence  . History of gastroesophageal reflux (GERD)   . Lower leg pain     pt states has fibula-tibula syndrome--  goes to physical therapy  . Wears glasses   . ED (erectile dysfunction) of organic origin   . Rosacea   . Keratosis, actinic     Past Surgical History  Procedure Laterality Date  . Trabeculectomy  2011    left eye (glaucoma)  . Shoulder arthroscopy Left LEFT  2012  . Colonoscopy  FEB 2015  . Orif clavicular fracture  12/03/2011    Procedure: OPEN REDUCTION INTERNAL FIXATION (ORIF) CLAVICULAR FRACTURE;  Surgeon: [name], MD;  Location: [facility];  Service: Orthopedics;  Laterality: Right;  . Nephrectomy Left 2003  . Lumbar laminectomy  1990  . Tonsillectomy  as child  . Cataract extraction w/ intraocular lens  implant, bilateral    . Green light laser turp (transurethral resection of prostate N/A 08/10/2013    Procedure: GREEN LIGHT LASER TURP (TRANSURETHRAL RESECTION OF PROSTATE;  Surgeon: [name], MD;  Location: [facility];  Service: Urology;  Laterality: N/A;       HPI  from the history and physical done on the day of admission:   Joseph Hanna is a 82 y.o. male with PMH of hyperlipidemia, GERD, hypothyroidism, left renal cell cancer (S/P of ephrectory 2003), BPH, ulcerative colitis, who presents with difficulty speaking.  Patient reports that he had one episode of difficulty speaking at about 1:30 PM, when he was coming out of a restaurant. He had trouble with his words. States this episode lasted about 40 minutes, then resolved spontaneously. Patient did not have unilateral weakness, numbness or tingling sensations. No vision change or hearing loss. Patient did  not have chest pain, shortness of breath, cough, abdominal pain, diarrhea, symptoms of a UTI. Currently he feels normal.  In ED, patient was found to have negative CT head for acute abnormalities, WBC 4.3, platelets 103, temperature normal, acute renal injury. Patient is admitted to inpatient for further eval and treatment. Neurology was consulted by EDP.  EKG: Independently reviewed. QTC 433, minimal ST depression in inferior leads.       Hospital Course:     1. Expressive of aphasia/dysarthria. Likely TIA. Symptoms completely resolved. Will be placed on daily aspirin 81 mg, LDL was stable A1c pending, echogram carotid stable. Seen by neurology cleared for home discharge.  2. Hx of ulcerative colitis. Continue outpatient regimen and follow with Dr. Oletta Lamas primary gastroenterologist post discharge.  3. Dyslipidemia. Continue Zocor and niacin.  4. BPH. On Proscar and Flomax combination which will be continued.  5. GERD. On PPI continue.  6. CKD stage III. Creatinine noted to be 1.5 has history of nephrectomy. This could be baseline no previous records. Was gently hydrated. Follow with PCP.  7. Hypothyroidism. Continue home regimen of Synthroid unchanged.     Discharge Condition: Stable  Follow UP  Follow-up Information    Follow up with PERINI,MARK A, MD. Schedule an appointment as soon as possible for a visit in 1 week.   Specialty:  Internal Medicine   Contact information:   [place] [id number] [phone]       Follow up with SETHI,PRAMOD, MD. Schedule an appointment as soon as possible for a visit in 2 months.   Specialties:  Neurology, Radiology   Contact information:   [address] [place] [address] [phone]       Follow up with [facility], MD. Schedule an appointment as soon as possible for a visit in 1 week.   Specialty:  Gastroenterology   Contact information:   [address] [place]  [address] [phone]        Consults obtained - Neuro  Diet and Activity recommendation: See Discharge Instructions below  Discharge Instructions       Discharge Instructions    Diet - low sodium heart healthy    Complete by:  As directed      Discharge instructions    Complete by:  As directed   Follow with Primary MD [name] A, MD in 7 days   Get CBC, CMP, 2 view Chest X ray checked  by Primary MD next visit.    Activity: As tolerated with Full fall precautions use walker/cane & assistance as needed   Disposition Home **   Diet:   Heart Healthy     For Heart failure patients - Check your Weight same time everyday, if you gain over 2 pounds, or you develop in leg swelling,  experience more shortness of breath or chest pain, call your Primary MD immediately. Follow Cardiac Low Salt Diet and 1.5 lit/day fluid restriction.   On your next visit with your primary care physician please Get Medicines reviewed and adjusted.   Please request your Prim.MD to go over all Hospital Tests and Procedure/Radiological results at the follow up, please get all Hospital records sent to your Prim MD by signing hospital release before you go home.   If you experience worsening of your admission symptoms, develop shortness of breath, life threatening emergency, suicidal or homicidal thoughts you must seek medical attention immediately by calling 911 or calling your MD immediately  if symptoms less severe.  You Must read complete instructions/literature along with all the possible adverse reactions/side effects for all the Medicines you take and that have been prescribed to you. Take any new Medicines after you have completely understood and accpet all the possible adverse reactions/side effects.   Do not drive, operating heavy machinery, perform activities at heights, swimming or participation in water activities or provide baby sitting services if your were admitted for syncope or siezures  until you have seen by Primary MD or a Neurologist and advised to do so again.  Do not drive when taking Pain medications.    Do not take more than prescribed Pain, Sleep and Anxiety Medications  Special Instructions: If you have smoked or chewed Tobacco  in the last 2 yrs please stop smoking, stop any regular Alcohol  and or any Recreational drug use.  Wear Seat belts while driving.   Please note  You were cared for by a hospitalist during your hospital stay. If you have any questions about your discharge medications or the care you received while you were in the hospital after you are discharged, you can call the unit and asked to speak with the hospitalist on call if the hospitalist that took care of you is not available. Once you are discharged, your primary care physician will handle any further medical issues. Please note that NO REFILLS for any discharge medications will be authorized once you are discharged, as it is imperative that you return to your primary care physician (or establish a relationship with a primary care physician if you do not have one) for your aftercare needs so that they can reassess your need for medications and monitor your lab values.     Increase activity slowly    Complete by:  As directed              Discharge Medications       Medication List    TAKE these medications        aspirin 81 MG tablet  Take 1 tablet (81 mg total) by mouth daily.     azaTHIOprine 50 MG tablet  Commonly known as:  IMURAN  Take 75 mg by mouth every morning. Takes 1  1/2  TABS     brimonidine 0.2 % ophthalmic solution  Commonly known as:  ALPHAGAN  Place 1 drop into both eyes 2 (two) times daily.     calcium carbonate 600 MG Tabs tablet  Commonly known as:  OS-CAL  Take 600 mg by mouth daily with breakfast.     CoQ10 200 MG Caps  Take 1 capsule by mouth daily.     EQ COMPLETE MULTIVIT ADULT 50+ PO  Take 1 tablet by mouth daily.     fish oil-omega-3 fatty  acids 1000 MG capsule  Take 1 g by  mouth 2 (two) times daily.     fluoruracil 0.5 % cream  Commonly known as:  CARAC  Apply 1 application topically 2 (two) times daily.     Ginkgo Biloba 40 MG Tabs  Take 120 mg by mouth daily.     latanoprost 0.005 % ophthalmic solution  Commonly known as:  XALATAN  Place 1 drop into the right eye at bedtime.     levothyroxine 50 MCG tablet  Commonly known as:  SYNTHROID, LEVOTHROID  Take 50-100 mcg by mouth daily before breakfast. Take 64mg everyday except Wednesday and Sunday take 1021m.     mesalamine 0.375 g 24 hr capsule  Commonly known as:  APRISO  Take 2,250 mg by mouth daily. TAKES 6 TABS DAILY--  TOTAL 2250MG     mesalamine 1000 MG suppository  Commonly known as:  CANASA  Place 1,000 mg rectally at bedtime.     metroNIDAZOLE 0.75 % gel  Commonly known as:  METROGEL  Apply 1 application topically 2 (two) times daily as needed (Rosacea).     niacin 500 MG tablet  Commonly known as:  SLO-NIACIN  Take 500 mg by mouth at bedtime.     sildenafil 100 MG tablet  Commonly known as:  VIAGRA  Take 50 mg by mouth daily as needed for erectile dysfunction.     simvastatin 20 MG tablet  Commonly known as:  ZOCOR  Take 10 mg by mouth every other day. TAKES QHS     timolol 0.5 % ophthalmic solution  Commonly known as:  TIMOPTIC  Place 1 drop into both eyes 2 (two) times daily.     timolol 0.5 % ophthalmic solution  Commonly known as:  BETIMOL  Place 1 drop into the right eye 2 (two) times daily.     urea 40 % Crea  Commonly known as:  CARMOL  Apply 20 mg topically daily.     Vitamin D3 1000 units Caps  Take 2 capsules by mouth daily.        Major procedures and Radiology Reports - PLEASE review detailed and final reports for all details, in brief -   TTE   - Normal LV size with moderate LV hypertrophy. EF 60-65%. Normal RV size and systolic function. No signficant valvular abnormalities.  Carotid Duplex (Doppler) has  been completed.  There is no obvious evidence of hemodynamically significant internal carotid artery stenosis bilaterally. Vertebral arteries are patent with antegrade flow.   Ct Head Wo Contrast  06/19/2015  CLINICAL DATA:  Difficulty speaking today at 1:30 p.m. EXAM: CT HEAD WITHOUT CONTRAST TECHNIQUE: Contiguous axial images were obtained from the base of the skull through the vertex without intravenous contrast. COMPARISON:  None. FINDINGS: There is atrophy and chronic small vessel disease changes. No acute intracranial abnormality. Specifically, no hemorrhage, hydrocephalus, mass lesion, acute infarction, or significant intracranial injury. No acute calvarial abnormality. Left sphenoid sinus is completely opacified. Remainder the paranasal sinuses and mastoid air cells are clear. IMPRESSION: No acute intracranial abnormality. Atrophy, chronic microvascular disease. Electronically Signed   By: KeRolm Baptise.D.   On: 06/19/2015 15:37   Mr Brain Wo Contrast  06/20/2015  CLINICAL DATA:  25 minute episode of speech difficulties beginning at 1:30 p.m. today while coming out of the restaurant. The no at baseline. No neuro deficits. History of hyperlipidemia. EXAM: MRI HEAD WITHOUT CONTRAST MRA HEAD WITHOUT CONTRAST TECHNIQUE: Multiplanar, multiecho pulse sequences of the brain and surrounding structures were obtained without intravenous contrast. Angiographic images of  the head were obtained using MRA technique without contrast. COMPARISON:  CT head February 2017 at 1527 hours FINDINGS: MRI HEAD FINDINGS The ventricles and sulci are normal for patient's age. No abnormal parenchymal signal, mass lesions, mass effect. No reduced diffusion to suggest acute ischemia. Minimal white matter changes most compatible chronic small vessel ischemic disease. No susceptibility artifact to suggest hemorrhage. No abnormal extra-axial fluid collections. No extra-axial masses though, contrast enhanced sequences would be more  sensitive. Normal major intracranial vascular flow voids seen at the skull base. Status post bilateral ocular lens implants. No abnormal sellar expansion. No suspicious calvarial bone marrow signal. Craniocervical junction maintained. Inspissated LEFT sphenoid sinusitis versus mucosal retention cyst. Mild paranasal sinus mucosal thickening. Mastoid air cells are well aerated. MRA HEAD FINDINGS Anterior circulation: Normal flow related enhancement of the included cervical, petrous, cavernous and supraclinoid internal carotid arteries. Patent anterior communicating artery. Normal flow related enhancement of the anterior and middle cerebral arteries, including distal segments. No large vessel occlusion, high-grade stenosis, abnormal luminal irregularity, aneurysm. Posterior circulation: RIGHT vertebral artery is dominant. Basilar artery is patent, with normal flow related enhancement of the main branch vessels. Normal flow related enhancement of the posterior cerebral arteries. No large vessel occlusion, high-grade stenosis, abnormal luminal irregularity, aneurysm. IMPRESSION: Normal MRI brain without contrast for age. Normal MRA head. Electronically Signed   By: Elon Alas M.D.   On: 06/20/2015 00:43   US Renal  06/20/2015  CLINICAL DATA:  Acute onset of renal insufficiency. Initial encounter. EXAM: RENAL / URINARY TRACT ULTRASOUND COMPLETE COMPARISON:  Renal ultrasound performed 08/12/2007, and CT of the abdomen and pelvis performed 09/20/2008 FINDINGS: Right Kidney: Length: 12.4 cm. Echogenicity within normal limits. No mass or hydronephrosis visualized. Left Kidney: Status post left-sided nephrectomy. Bladder: Appears normal for degree of bladder distention. The prostate is enlarged, measuring 5.6 cm in transverse dimension. IMPRESSION: 1. No evidence of hydronephrosis. Right kidney is unremarkable in appearance. Status post left-sided nephrectomy. 2. Enlarged prostate noted. Electronically Signed   By:  Garald Balding M.D.   On: 06/20/2015 00:35   Mr Jodene Nam Head/brain Wo Cm  06/20/2015  CLINICAL DATA:  25 minute episode of speech difficulties beginning at 1:30 p.m. today while coming out of the restaurant. The no at baseline. No neuro deficits. History of hyperlipidemia. EXAM: MRI HEAD WITHOUT CONTRAST MRA HEAD WITHOUT CONTRAST TECHNIQUE: Multiplanar, multiecho pulse sequences of the brain and surrounding structures were obtained without intravenous contrast. Angiographic images of the head were obtained using MRA technique without contrast. COMPARISON:  CT head February 2017 at 1527 hours FINDINGS: MRI HEAD FINDINGS The ventricles and sulci are normal for patient's age. No abnormal parenchymal signal, mass lesions, mass effect. No reduced diffusion to suggest acute ischemia. Minimal white matter changes most compatible chronic small vessel ischemic disease. No susceptibility artifact to suggest hemorrhage. No abnormal extra-axial fluid collections. No extra-axial masses though, contrast enhanced sequences would be more sensitive. Normal major intracranial vascular flow voids seen at the skull base. Status post bilateral ocular lens implants. No abnormal sellar expansion. No suspicious calvarial bone marrow signal. Craniocervical junction maintained. Inspissated LEFT sphenoid sinusitis versus mucosal retention cyst. Mild paranasal sinus mucosal thickening. Mastoid air cells are well aerated. MRA HEAD FINDINGS Anterior circulation: Normal flow related enhancement of the included cervical, petrous, cavernous and supraclinoid internal carotid arteries. Patent anterior communicating artery. Normal flow related enhancement of the anterior and middle cerebral arteries, including distal segments. No large vessel occlusion, high-grade stenosis, abnormal luminal irregularity, aneurysm. Posterior  circulation: RIGHT vertebral artery is dominant. Basilar artery is patent, with normal flow related enhancement of the main branch  vessels. Normal flow related enhancement of the posterior cerebral arteries. No large vessel occlusion, high-grade stenosis, abnormal luminal irregularity, aneurysm. IMPRESSION: Normal MRI brain without contrast for age. Normal MRA head. Electronically Signed   By: Elon Alas M.D.   On: 06/20/2015 00:43    Micro Results      No results found for this or any previous visit (from the past 240 hour(s)).     Today   Subjective    Maurice March today has no headache,no chest abdominal pain,no new weakness tingling or numbness, feels much better wants to go home today.    Objective   Blood pressure 148/63, pulse 55, temperature 97.7 F (36.5 C), temperature source Oral, resp. rate 16, height 6' 2"  (1.88 m), weight 81.239 kg (179 lb 1.6 oz), SpO2 96 %.  No intake or output data in the 24 hours ending 06/20/15 1342  Exam Awake Alert, Oriented x 3, No new F.N deficits, Normal affect [place].AT,PERRAL Supple Neck,No JVD, No cervical lymphadenopathy appriciated.  Symmetrical Chest wall movement, Good air movement bilaterally, CTAB RRR,No Gallops,Rubs or new Murmurs, No Parasternal Heave +ve B.Sounds, Abd Soft, Non tender, No organomegaly appriciated, No rebound -guarding or rigidity. No Cyanosis, Clubbing or edema, No new Rash or bruise   Data Review   CBC w Diff:  Lab Results  Component Value Date   WBC 4.3 06/19/2015   HGB 13.5 06/19/2015   HCT 40.8 06/19/2015   PLT 103* 06/19/2015   LYMPHOPCT 28 06/19/2015   MONOPCT 21 06/19/2015   EOSPCT 0 06/19/2015   BASOPCT 0 06/19/2015    CMP:  Lab Results  Component Value Date   NA 142 06/19/2015   K 4.3 06/19/2015   CL 101 06/19/2015   CO2 29 06/19/2015   BUN 19 06/19/2015   CREATININE 1.53* 06/19/2015   PROT 6.6 06/19/2015   ALBUMIN 4.2 06/19/2015   BILITOT 0.9 06/19/2015   ALKPHOS 36* 06/19/2015   AST 26 06/19/2015   ALT 21 06/19/2015   No results found for: HGBA1C  Lab Results  Component Value Date   CHOL 126  06/20/2015   HDL 30* 06/20/2015   LDLCALC 66 06/20/2015   TRIG 152* 06/20/2015   CHOLHDL 4.2 06/20/2015    No results found for: HGBA1C  Total Time in preparing paper work, data evaluation and todays exam - 35 minutes  Lala Lund K M.D on 06/20/2015 at 1:42 PM  [facility]   Office  [phone]

## 2015-06-20 NOTE — Progress Notes (Signed)
Arrived from ED. Alert and oriented. Denies any pain. Oriented to room. Call light within reach.  

## 2015-06-20 NOTE — Evaluation (Signed)
Speech Language Pathology Evaluation Patient Details Name: Joseph Hanna MRN: [id number] DOB: 1932/09/13 Today's Date: 06/20/2015 Time: 1315-1340 SLP Time Calculation (min) (ACUTE ONLY): 25 min  Problem List:  Patient Active Problem List   Diagnosis Date Noted  . Difficulty speaking 06/19/2015  . TIA (transient ischemic attack) 06/19/2015  . Thrombocytasthenia ([place]) 06/19/2015  . CKD (chronic kidney disease) 06/19/2015  . BPH (benign prostatic hypertrophy)   . History of ulcerative colitis   . Hypothyroidism   . Hyperlipidemia   . History of renal cell cancer   . History of gastroesophageal reflux (GERD)    Past Medical History:  Past Medical History  Diagnosis Date  . Glaucoma     bilateral --  right uses rx drops and left eye trabeculectomy 2011  . Arthritis   . Wears glasses   . Complication of anesthesia     POST URINARY RETENTION  . BPH (benign prostatic hypertrophy)   . History of ulcerative colitis     in remission  . Wears glasses   . Hypothyroidism   . Hyperlipidemia   . History of renal cell cancer     S/P  LEFT NEPHRECTOMY  2003--  no recurrence  . History of gastroesophageal reflux (GERD)   . Lower leg pain     pt states has fibula-tibula syndrome--  goes to physical therapy  . Wears glasses   . ED (erectile dysfunction) of organic origin   . Rosacea   . Keratosis, actinic    Past Surgical History:  Past Surgical History  Procedure Laterality Date  . Trabeculectomy  2011    left eye (glaucoma)  . Shoulder arthroscopy Left LEFT  2012  . Colonoscopy  FEB 2015  . Orif clavicular fracture  12/03/2011    Procedure: OPEN REDUCTION INTERNAL FIXATION (ORIF) CLAVICULAR FRACTURE;  Surgeon: [name], MD;  Location: [facility];  Service: Orthopedics;  Laterality: Right;  . Nephrectomy Left 2003  . Lumbar laminectomy  1990  . Tonsillectomy  as child  . Cataract extraction w/ intraocular lens  implant, bilateral    . Green light  laser turp (transurethral resection of prostate N/A 08/10/2013    Procedure: GREEN LIGHT LASER TURP (TRANSURETHRAL RESECTION OF PROSTATE;  Surgeon: [name], MD;  [facility];  Service: Urology;  Laterality: N/A;   HPI:  Joseph Hanna is an 82 y.o. male patient who presented to the ER following a 40 minute episode of inability to speak sentences and difficulty with finding the right words.  MRI negative for acute changes.    Assessment / Plan / Recommendation Clinical Impression   Pt presents with grossly intact cognitive-linguistic function.  Pt is able to follow complex multi-unit commands and answer complex, factual yes/no questions.  Pt's speech is fluent at the conversational level and free from dysarthria or word finding impairment.  Pt reports complete resolution of symptoms which prompted hospital visit.  As a result, no further ST needs are indicated at this time.  Pt and wife are in agreement with recommendations.      SLP Assessment  Patient does not need any further Speech Lanaguage Pathology Services    Follow Up Recommendations  None          SLP Evaluation Prior Functioning  Cognitive/Linguistic Baseline: Within functional limits Type of Home: House  Lives With: Spouse Available Help at Discharge: Family Vocation: Retired   Associate Professor  Overall Cognitive Status: Within Functional Limits for tasks assessed  Orientation Level: Oriented X4    Comprehension  Auditory Comprehension Overall Auditory Comprehension: Appears within functional limits for tasks assessed    Expression Expression Primary Mode of Expression: Verbal Verbal Expression Overall Verbal Expression: Appears within functional limits for tasks assessed   Oral / Motor  Oral Motor/Sensory Function Overall Oral Motor/Sensory Function: Within functional limits Motor Speech Overall Motor Speech: Appears within functional limits for tasks assessed   GO           Functional Assessment Tool Used: cognitive-linguistic evaluation completed Functional Limitations: Spoken language expressive Spoken Language Expression Current Status [id number]): 0 percent impaired, limited or restricted Spoken Language Expression Goal Status [id number]): 0 percent impaired, limited or restricted Spoken Language Expression Discharge Status [phone]): 0 percent impaired, limited or restricted         Emilio Math 06/20/2015, 1:48 PM

## 2015-06-20 NOTE — Care Management Note (Signed)
Case Management Note  Patient Details  Name: Joseph Hanna MRN: [id number] Date of Birth: Apr 30, 1933  Subjective/Objective:                    Action/Plan: Patient presented with difficulty speaking and word finding. Lives at home with wife. Will follow for discharge needs pending PT/OT evals and physician orders.  Expected Discharge Date:                  Expected Discharge Plan:     In-House Referral:     Discharge planning Services     Post Acute Care Choice:    Choice offered to:     DME Arranged:    DME Agency:     HH Arranged:    HH Agency:     Status of Service:  In process, will continue to follow  Medicare Important Message Given:    Date Medicare IM Given:    Medicare IM give by:    Date Additional Medicare IM Given:    Additional Medicare Important Message give by:     If discussed at [place] of Stay Meetings, dates discussed:    Additional Comments:  Rolm Baptise, RN 06/20/2015, 10:57 AM [phone]

## 2015-06-20 NOTE — Consult Note (Signed)
Requesting Physician: Dr. Darl Householder     Reason for consultation: Possible transient ischemic attack  HPI:                                                                                                                                         Joseph Hanna is an 82 y.o. male patient who presented to the ER following a 40 minute episode of inability to speak. Sentences, difficulty with finding the right words. This occurred at around 1 PM when he was at a restaurant with his wife. He was able to understand his wife's speech, denies any problems with comprehension. Denies any other neurological symptoms at that time such as vision problems or focal sensorimotor symptoms or gait abnormalities. Came into the ER for evaluation at about 2 PM.  He takes 40 mg of aspirin every other day mainly to help with the flushing caused by niacin and also takes Zocor every other day. History of renal cell carcinoma, status post nephrectomy several years ago, history of ulcerative colitis with some GI bleed several years ago. Reports easy skin bruising but no recent bleeding problems.   Date last known well:  06/19/2015 Time last known well:  1 PM tPA Given: No: Symptoms resolved, outside of TPA window  Stroke Risk Factors - hypercoagulable state, hyperlipidemia and   history of renal cell carcinoma  Past Medical History: Past Medical History  Diagnosis Date  . Glaucoma     bilateral --  right uses rx drops and left eye trabeculectomy 2011  . Arthritis   . Wears glasses   . Complication of anesthesia     POST URINARY RETENTION  . BPH (benign prostatic hypertrophy)   . History of ulcerative colitis     in remission  . Wears glasses   . Hypothyroidism   . Hyperlipidemia   . History of renal cell cancer     S/P  LEFT NEPHRECTOMY  2003--  no recurrence  . History of gastroesophageal reflux (GERD)   . Lower leg pain     pt states has fibula-tibula syndrome--  goes to physical therapy  . Wears glasses   . ED  (erectile dysfunction) of organic origin   . Rosacea   . Keratosis, actinic     Past Surgical History  Procedure Laterality Date  . Trabeculectomy  2011    left eye (glaucoma)  . Shoulder arthroscopy Left LEFT  2012  . Colonoscopy  FEB 2015  . Orif clavicular fracture  12/03/2011    Procedure: OPEN REDUCTION INTERNAL FIXATION (ORIF) CLAVICULAR FRACTURE;  Surgeon: [name], MD;  Location: [facility];  Service: Orthopedics;  Laterality: Right;  . Nephrectomy Left 2003  . Lumbar laminectomy  1990  . Tonsillectomy  as child  . Cataract extraction w/ intraocular lens  implant, bilateral    . Green light laser turp (transurethral resection of prostate  N/A 08/10/2013    Procedure: GREEN LIGHT LASER TURP (TRANSURETHRAL RESECTION OF PROSTATE;  Surgeon: [name], MD;  Location: [facility];  Service: Urology;  Laterality: N/A;    Family History: Family History  Problem Relation Age of Onset  . Breast cancer Mother   . Emphysema Father   . Alzheimer's disease Brother     Social History:   reports that he quit smoking about 61 years ago. His smoking use included Cigarettes. He quit after 6 years of use. He has never used smokeless tobacco. He reports that he drinks about 4.2 oz of alcohol per week. He reports that he does not use illicit drugs.  Allergies:  Allergies  Allergen Reactions  . Lipitor [Atorvastatin] Other (See Comments)    "severe muscle aches"  . Sporanox [Itraconazole] Rash     Medications:                                                                                                                         Current facility-administered medications:  .   stroke: mapping our early stages of recovery book, , Does not apply, Once, Ivor Costa, MD .  0.9 %  sodium chloride infusion, , Intravenous, Continuous, Ivor Costa, MD .  aspirin EC tablet 81 mg, 81 mg, Oral, Daily, Ivor Costa, MD .  azaTHIOprine (IMURAN) tablet  75 mg, 75 mg, Oral, Daily, Ivor Costa, MD .  calcium carbonate (OS-CAL - dosed in mg of elemental calcium) tablet 1,250 mg, 1,250 mg, Oral, BID WC, Ivor Costa, MD .  cholecalciferol (VITAMIN D) tablet 1,000 Units, 1,000 Units, Oral, Daily, Ivor Costa, MD .  dorzolamide (TRUSOPT) 2 % ophthalmic solution 1 drop, 1 drop, Right Eye, TID, Ivor Costa, MD .  enoxaparin (LOVENOX) injection 40 mg, 40 mg, Subcutaneous, Daily, Ivor Costa, MD .  finasteride (PROSCAR) tablet 5 mg, 5 mg, Oral, Daily, Ivor Costa, MD .  ketoconazole (NIZORAL) 2 % cream 1 application, 1 application, Topical, Daily, Ivor Costa, MD .  latanoprost (XALATAN) 0.005 % ophthalmic solution 1 drop, 1 drop, Right Eye, QHS, Ivor Costa, MD .  levothyroxine (SYNTHROID, LEVOTHROID) tablet 50 mcg, 50 mcg, Oral, Once per day on Mon Tue Thu Fri Sat **AND** [START ON 06/21/2015] levothyroxine (SYNTHROID, LEVOTHROID) tablet 100 mcg, 100 mcg, Oral, Once per day on Sun Wed, Ahmad Hamad, MD .  mesalamine (APRISO) 24 hr capsule 2.25 g, 2.25 g, Oral, Daily, Ivor Costa, MD .  mesalamine (CANASA) suppository 1,000 mg, 1,000 mg, Rectal, QHS, Ivor Costa, MD .  metroNIDAZOLE (METROGEL) A999333 % gel 1 application, 1 application, Topical, BID PRN, Ivor Costa, MD .  multivitamin with minerals tablet 1 tablet, 1 tablet, Oral, Daily, Ivor Costa, MD .  niacin CR capsule 500 mg, 500 mg, Oral, QHS, Ivor Costa, MD .  omega-3 acid ethyl esters (LOVAZA) capsule 2 g, 2 g, Oral, Daily, Ivor Costa, MD .  oxyCODONE-acetaminophen (PERCOCET/ROXICET) 5-325 MG per tablet 1-2 tablet, 1-2 tablet, Oral, Q4H  PRN, Ivor Costa, MD .  pantoprazole (PROTONIX) EC tablet 40 mg, 40 mg, Oral, Q1200, Ivor Costa, MD .  senna-docusate (Senokot-S) tablet 1 tablet, 1 tablet, Oral, QHS PRN, Ivor Costa, MD .  simvastatin (ZOCOR) tablet 10 mg, 10 mg, Oral, QODAY, Ivor Costa, MD .  tamsulosin (FLOMAX) capsule 0.4 mg, 0.4 mg, Oral, QPC supper, Ivor Costa, MD .  timolol (TIMOPTIC) 0.5 % ophthalmic solution 1 drop, 1 drop,  Right Eye, BID, Ivor Costa, MD   ROS:                                                                                                                                       History obtained from the patient  General ROS: negative for - chills, fatigue, fever, night sweats, weight gain or weight loss Psychological ROS: negative for - behavioral disorder, hallucinations, memory difficulties, mood swings or suicidal ideation Ophthalmic ROS: negative for - blurry vision, double vision, eye pain or loss of vision ENT ROS: negative for - epistaxis, nasal discharge, oral lesions, sore throat, tinnitus or vertigo Allergy and Immunology ROS: negative for - hives or itchy/watery eyes Hematological and Lymphatic ROS: negative for - bleeding problems, bruising or swollen lymph nodes Endocrine ROS: negative for - galactorrhea, hair pattern changes, polydipsia/polyuria or temperature intolerance Respiratory ROS: negative for - cough, hemoptysis, shortness of breath or wheezing Cardiovascular ROS: negative for - chest pain, dyspnea on exertion, edema or irregular heartbeat Gastrointestinal ROS: negative for - abdominal pain, diarrhea, hematemesis, nausea/vomiting or stool incontinence Genito-Urinary ROS: negative for - dysuria, hematuria, incontinence or urinary frequency/urgency Musculoskeletal ROS: negative for - joint swelling or muscular weakness Neurological ROS: as noted in HPI Dermatological ROS: negative for rash and skin lesion changes  Neurologic Examination:                                                                                                      Blood pressure 169/69, pulse 58, temperature 98.1 F (36.7 C), temperature source Oral, resp. rate 16, height 6\' 2"  (1.88 m), weight 81.239 kg (179 lb 1.6 oz), SpO2 97 %.  Evaluation of higher integrative functions including: Level of alertness: Alert,  Oriented to time, place and person Recent and remote memory - intact   Attention span  and concentration  - intact   Speech: fluent, no evidence of dysarthria or aphasia noted.  Test the following cranial nerves: 2-12 grossly intact Motor examination: Normal tone, bulk, full 5/5 motor strength in all 4 extremities  Examination of sensation : Normal and symmetric sensation to pinprick in all 4 extremities and on face Examination of deep tendon reflexes: 2+, normal and symmetric in all extremities, normal plantars bilaterally Test coordination: Normal finger nose testing, with no evidence of limb appendicular ataxia or abnormal involuntary movements or tremors noted.  Gait: Deferred   Lab Results: Basic Metabolic Panel:  Recent Labs Lab 06/19/15 1517  NA 142  K 4.3  CL 101  CO2 29  GLUCOSE 119*  BUN 19  CREATININE 1.53*  CALCIUM 9.6    Liver Function Tests:  Recent Labs Lab 06/19/15 1517  AST 26  ALT 21  ALKPHOS 36*  BILITOT 0.9  PROT 6.6  ALBUMIN 4.2   No results for input(s): LIPASE, AMYLASE in the last 168 hours. No results for input(s): AMMONIA in the last 168 hours.  CBC:  Recent Labs Lab 06/19/15 1517  WBC 4.3  NEUTROABS 2.2  HGB 13.5  HCT 40.8  MCV 94.7  PLT 103*    Cardiac Enzymes: No results for input(s): CKTOTAL, CKMB, CKMBINDEX, TROPONINI in the last 168 hours.  Lipid Panel: No results for input(s): CHOL, TRIG, HDL, CHOLHDL, VLDL, LDLCALC in the last 168 hours.  CBG: No results for input(s): GLUCAP in the last 168 hours.  Microbiology: No results found for this or any previous visit.   Imaging: Ct Head Wo Contrast  06/19/2015  CLINICAL DATA:  Difficulty speaking today at 1:30 p.m. EXAM: CT HEAD WITHOUT CONTRAST TECHNIQUE: Contiguous axial images were obtained from the base of the skull through the vertex without intravenous contrast. COMPARISON:  None. FINDINGS: There is atrophy and chronic small vessel disease changes. No acute intracranial abnormality. Specifically, no hemorrhage, hydrocephalus, mass lesion, acute  infarction, or significant intracranial injury. No acute calvarial abnormality. Left sphenoid sinus is completely opacified. Remainder the paranasal sinuses and mastoid air cells are clear. IMPRESSION: No acute intracranial abnormality. Atrophy, chronic microvascular disease. Electronically Signed   By: Rolm Baptise M.D.   On: 06/19/2015 15:37   Mr Brain Wo Contrast  06/20/2015  CLINICAL DATA:  25 minute episode of speech difficulties beginning at 1:30 p.m. today while coming out of the restaurant. The no at baseline. No neuro deficits. History of hyperlipidemia. EXAM: MRI HEAD WITHOUT CONTRAST MRA HEAD WITHOUT CONTRAST TECHNIQUE: Multiplanar, multiecho pulse sequences of the brain and surrounding structures were obtained without intravenous contrast. Angiographic images of the head were obtained using MRA technique without contrast. COMPARISON:  CT head February 2017 at 1527 hours FINDINGS: MRI HEAD FINDINGS The ventricles and sulci are normal for patient's age. No abnormal parenchymal signal, mass lesions, mass effect. No reduced diffusion to suggest acute ischemia. Minimal white matter changes most compatible chronic small vessel ischemic disease. No susceptibility artifact to suggest hemorrhage. No abnormal extra-axial fluid collections. No extra-axial masses though, contrast enhanced sequences would be more sensitive. Normal major intracranial vascular flow voids seen at the skull base. Status post bilateral ocular lens implants. No abnormal sellar expansion. No suspicious calvarial bone marrow signal. Craniocervical junction maintained. Inspissated LEFT sphenoid sinusitis versus mucosal retention cyst. Mild paranasal sinus mucosal thickening. Mastoid air cells are well aerated. MRA HEAD FINDINGS Anterior circulation: Normal flow related enhancement of the included cervical, petrous, cavernous and supraclinoid internal carotid arteries. Patent anterior communicating artery. Normal flow related enhancement of  the anterior and middle cerebral arteries, including distal segments. No large vessel occlusion, high-grade stenosis, abnormal luminal irregularity, aneurysm. Posterior circulation: RIGHT vertebral artery is dominant. Basilar artery is patent,  with normal flow related enhancement of the main branch vessels. Normal flow related enhancement of the posterior cerebral arteries. No large vessel occlusion, high-grade stenosis, abnormal luminal irregularity, aneurysm. IMPRESSION: Normal MRI brain without contrast for age. Normal MRA head. Electronically Signed   By: Elon Alas M.D.   On: 06/20/2015 00:43   US Renal  06/20/2015  CLINICAL DATA:  Acute onset of renal insufficiency. Initial encounter. EXAM: RENAL / URINARY TRACT ULTRASOUND COMPLETE COMPARISON:  Renal ultrasound performed 08/12/2007, and CT of the abdomen and pelvis performed 09/20/2008 FINDINGS: Right Kidney: Length: 12.4 cm. Echogenicity within normal limits. No mass or hydronephrosis visualized. Left Kidney: Status post left-sided nephrectomy. Bladder: Appears normal for degree of bladder distention. The prostate is enlarged, measuring 5.6 cm in transverse dimension. IMPRESSION: 1. No evidence of hydronephrosis. Right kidney is unremarkable in appearance. Status post left-sided nephrectomy. 2. Enlarged prostate noted. Electronically Signed   By: Garald Balding M.D.   On: 06/20/2015 00:35   Mr Jodene Nam Head/brain Wo Cm  06/20/2015  CLINICAL DATA:  25 minute episode of speech difficulties beginning at 1:30 p.m. today while coming out of the restaurant. The no at baseline. No neuro deficits. History of hyperlipidemia. EXAM: MRI HEAD WITHOUT CONTRAST MRA HEAD WITHOUT CONTRAST TECHNIQUE: Multiplanar, multiecho pulse sequences of the brain and surrounding structures were obtained without intravenous contrast. Angiographic images of the head were obtained using MRA technique without contrast. COMPARISON:  CT head February 2017 at 1527 hours FINDINGS: MRI HEAD  FINDINGS The ventricles and sulci are normal for patient's age. No abnormal parenchymal signal, mass lesions, mass effect. No reduced diffusion to suggest acute ischemia. Minimal white matter changes most compatible chronic small vessel ischemic disease. No susceptibility artifact to suggest hemorrhage. No abnormal extra-axial fluid collections. No extra-axial masses though, contrast enhanced sequences would be more sensitive. Normal major intracranial vascular flow voids seen at the skull base. Status post bilateral ocular lens implants. No abnormal sellar expansion. No suspicious calvarial bone marrow signal. Craniocervical junction maintained. Inspissated LEFT sphenoid sinusitis versus mucosal retention cyst. Mild paranasal sinus mucosal thickening. Mastoid air cells are well aerated. MRA HEAD FINDINGS Anterior circulation: Normal flow related enhancement of the included cervical, petrous, cavernous and supraclinoid internal carotid arteries. Patent anterior communicating artery. Normal flow related enhancement of the anterior and middle cerebral arteries, including distal segments. No large vessel occlusion, high-grade stenosis, abnormal luminal irregularity, aneurysm. Posterior circulation: RIGHT vertebral artery is dominant. Basilar artery is patent, with normal flow related enhancement of the main branch vessels. Normal flow related enhancement of the posterior cerebral arteries. No large vessel occlusion, high-grade stenosis, abnormal luminal irregularity, aneurysm. IMPRESSION: Normal MRI brain without contrast for age. Normal MRA head. Electronically Signed   By: Elon Alas M.D.   On: 06/20/2015 00:43    Assessment and plan:   Joseph Hanna is an 82 y.o. male patient who presented to the ER for further evaluation of her 40 minute episode of what appears to be a Broca's aphasia based on the history, with no other focal neurological symptoms at that time. Symptoms are completely resolved in about  40 minutes and was a symptomatic at the time of my evaluation in the ER later in the evening. Given his age, hypercoagulable risk factors due to history of autoimmune condition or ulcerative colitis, and renal cell carcinoma history, status post nephrectomy, recommend further stroke workup. He takes aspirin 40 mg every other day, mainly to prevent flushing due to niacin. Denies any active bleeding problems,  except for some easy skin bruising which reportedly started after using high-dose steroids for 6 months for ulcerative colitis, over 15 years ago. Patient will be admitted to hospitalist service for overnight observation and for completion of neurodiagnostic workup. Recommend MRI of the brain, MRA of the head, ultrasound of the carotids, transthoracic echocardiogram, and will be placed on cardiac telemetry to rule out paroxysmal atrial fibrillation. Recommend taking aspirin 81 mg every day. We discussed about relative efficacy of aspirin versus Plavix for stroke prevention and given his the risk factors and the TIA event earlier this afternoon, I advised switching to Plavix 75 mg daily would be a better option than aspirin. The patient is reluctant to make these medication changes without discussing with his primary care physician, and hence will defer this to his PCP to follow up after discharge.  At the time of finalizing this note, brain MRI and MRA have been completed, both of which were negative for any acute pathology.   Neurology service for follow-up after completing the above workup prior to discharge.   Discussed with the admitting hospitalist Dr. Blaine Hamper.

## 2015-06-20 NOTE — Progress Notes (Signed)
  Echocardiogram 2D Echocardiogram has been performed.  Jennette Dubin 06/20/2015, 9:43 AM

## 2015-06-20 NOTE — Progress Notes (Signed)
*  PRELIMINARY RESULTS* Vascular Ultrasound Carotid Duplex (Doppler) has been completed.  There is no obvious evidence of hemodynamically significant internal carotid artery stenosis bilaterally. Vertebral arteries are patent with antegrade flow.  06/20/2015 6:22 PM Maudry Mayhew, RVT, RDCS, RDMS

## 2015-06-20 NOTE — Progress Notes (Signed)
PHARMACIST - PHYSICIAN ORDER COMMUNICATION  CONCERNING: P&T Medication Policy on Herbal Medications  DESCRIPTION:  This patient's order for:  CoQ10 and Gingko Biloba  has been noted.  This product(s) is classified as an "herbal" or natural product. Due to a lack of definitive safety studies or FDA approval, nonstandard manufacturing practices, plus the potential risk of unknown drug-drug interactions while on inpatient medications, the Pharmacy and Therapeutics Committee does not permit the use of "herbal" or natural products of this type within [facility].   ACTION TAKEN: The pharmacy department is unable to verify this order at this time and your patient has been informed of this safety policy. Please reevaluate patient's clinical condition at discharge and address if the herbal or natural product(s) should be resumed at that time.

## 2015-06-20 NOTE — Discharge Instructions (Signed)
Follow with Primary MD [name] A, MD in 7 days   Get CBC, CMP, 2 view Chest X ray checked  by Primary MD next visit.    Activity: As tolerated with Full fall precautions use walker/cane & assistance as needed   Disposition Home **   Diet:   Heart Healthy     For Heart failure patients - Check your Weight same time everyday, if you gain over 2 pounds, or you develop in leg swelling, experience more shortness of breath or chest pain, call your Primary MD immediately. Follow Cardiac Low Salt Diet and 1.5 lit/day fluid restriction.   On your next visit with your primary care physician please Get Medicines reviewed and adjusted.   Please request your Prim.MD to go over all Hospital Tests and Procedure/Radiological results at the follow up, please get all Hospital records sent to your Prim MD by signing hospital release before you go home.   If you experience worsening of your admission symptoms, develop shortness of breath, life threatening emergency, suicidal or homicidal thoughts you must seek medical attention immediately by calling 911 or calling your MD immediately  if symptoms less severe.  You Must read complete instructions/literature along with all the possible adverse reactions/side effects for all the Medicines you take and that have been prescribed to you. Take any new Medicines after you have completely understood and accpet all the possible adverse reactions/side effects.   Do not drive, operating heavy machinery, perform activities at heights, swimming or participation in water activities or provide baby sitting services if your were admitted for syncope or siezures until you have seen by Primary MD or a Neurologist and advised to do so again.  Do not drive when taking Pain medications.    Do not take more than prescribed Pain, Sleep and Anxiety Medications  Special Instructions: If you have smoked or chewed Tobacco  in the last 2 yrs please stop smoking, stop any  regular Alcohol  and or any Recreational drug use.  Wear Seat belts while driving.   Please note  You were cared for by a hospitalist during your hospital stay. If you have any questions about your discharge medications or the care you received while you were in the hospital after you are discharged, you can call the unit and asked to speak with the hospitalist on call if the hospitalist that took care of you is not available. Once you are discharged, your primary care physician will handle any further medical issues. Please note that NO REFILLS for any discharge medications will be authorized once you are discharged, as it is imperative that you return to your primary care physician (or establish a relationship with a primary care physician if you do not have one) for your aftercare needs so that they can reassess your need for medications and monitor your lab values.

## 2015-06-20 NOTE — Progress Notes (Signed)
STROKE TEAM PROGRESS NOTE   HISTORY OF PRESENT ILLNESS Joseph Hanna is an 82 y.o. male patient who presented to the ER following a 40 minute episode of inability to speak. Sentences, difficulty with finding the right words. This occurred at around 1 PM when he was at a restaurant with his wife. He was able to understand his wife's speech, denies any problems with comprehension. Denies any other neurological symptoms at that time such as vision problems or focal sensorimotor symptoms or gait abnormalities. Came into the ER for evaluation at about 2 PM. He takes 40 mg of aspirin every other day mainly to help with the flushing caused by niacin and also takes Zocor every other day. History of renal cell carcinoma, status post nephrectomy several years ago, history of ulcerative colitis with some GI bleed several years ago. Reports easy skin bruising but no recent bleeding problems. He was last known well to 10/31/2015 at 1 PM. Patient was not administered TPA secondary to Symptoms resolved, outside of TPA window. He was admitted for further evaluation and treatment.   SUBJECTIVE (INTERVAL HISTORY) His wife is at the bedside.  She describes hx paraphasic errors at onset yesterday. Overall he feels his condition is completely resolved. He feels back to baseline   OBJECTIVE Temp:  [97.5 F (36.4 C)-98.1 F (36.7 C)] 97.7 F (36.5 C) (02/07 0650) Pulse Rate:  [50-64] 55 (02/07 0650) Cardiac Rhythm:  [-] Sinus bradycardia;Junctional rhythm (02/07 0700) Resp:  [14-20] 16 (02/07 0650) BP: (148-189)/(63-90) 148/63 mmHg (02/07 0650) SpO2:  [96 %-100 %] 96 % (02/07 0650) Weight:  [81.239 kg (179 lb 1.6 oz)] 81.239 kg (179 lb 1.6 oz) (02/07 0045)  CBC:   Recent Labs Lab 06/19/15 1517  WBC 4.3  NEUTROABS 2.2  HGB 13.5  HCT 40.8  MCV 94.7  PLT 103*    Basic Metabolic Panel:   Recent Labs Lab 06/19/15 1517  NA 142  K 4.3  CL 101  CO2 29  GLUCOSE 119*  BUN 19  CREATININE 1.53*  CALCIUM  9.6    Lipid Panel:     Component Value Date/Time   CHOL 126 06/20/2015 0341   TRIG 152* 06/20/2015 0341   HDL 30* 06/20/2015 0341   CHOLHDL 4.2 06/20/2015 0341   VLDL 30 06/20/2015 0341   LDLCALC 66 06/20/2015 0341   HgbA1c: No results found for: HGBA1C Urine Drug Screen: No results found for: LABOPIA, COCAINSCRNUR, LABBENZ, AMPHETMU, THCU, LABBARB    IMAGING  Ct Head Wo Contrast 06/19/2015   No acute intracranial abnormality. Atrophy, chronic microvascular disease.   Mr Brain Wo Contrast 06/20/2015   Normal MRI brain without contrast for age.   Mr Jodene Nam Head/brain Wo Cm 06/20/2015  Normal MRA head.   US Renal 06/20/2015   1. No evidence of hydronephrosis. Right kidney is unremarkable in appearance. Status post left-sided nephrectomy. 2. Enlarged prostate noted.     PHYSICAL EXAM Pleasant elderly Caucasian male currently not in distress. . Afebrile. Head is nontraumatic. Neck is supple without bruit.    Cardiac exam no murmur or gallop. Lungs are clear to auscultation. Distal pulses are well felt. Neurological Exam ;  Awake  Alert oriented x 3. Normal speech and language.eye movements full without nystagmus.fundi were not visualized. Vision acuity and fields appear normal. Hearing is normal. Palatal movements are normal. Face symmetric. Tongue midline. Normal strength, tone, reflexes and coordination. Normal sensation. Gait deferred. ASSESSMENT/PLAN [name] is a 82 y.o. male with history of  hyperlipidemia GERD, hypothyroidism, left renal cell carcinoma status post nephrectomy 2003, BPH and ulcerative colitis presenting with difficulty speaking. He did not receive IV t-PA due to symptoms resolved, outside of the window.   L brain TIA  Resultant  Neurologic symptoms resolved  MRI  No acute stroke  MRA  nremarkable  Carotid Doppler  pending   2D Echo  pending   LDL 66  HgbA1c pending  Lovenox 40 mg sq daily for VTE prophylaxis Diet Heart Room service  appropriate?: Yes; Fluid consistency:: Thin  1/2 tab of aspirin 81 mg every other day - takes 1/2 tab every other day prior to admission (pt reports because he is on niacin and aspirin decreases its absorption), now on aspirin 81 mg daily  Patient counseled to be compliant with his antithrombotic medications  Ongoing aggressive stroke risk factor management  Patient works out 3 days/week at [employer].  Patient given information to consider the PARFAIT TRIAL. [facility] will followup.  Therapy recommendations:  Pending. Ok to be off bedrest. Order written  Disposition:  Return home  Hypertension  Slightly elevated, 140s-150/160s  Hyperlipidemia  Home meds:  zocor 10 every other day, resumed in hospital  LDL 66, goal < 70  Continue statin at discharge  Other Stroke Risk Factors  Advanced age  Former Cigarette smoker, quit smoking 61 years ago   ETOH use  Other Active Problems  BPH  History of ulcerative colitis  GERD  Thrombocytopaenia  Chronic kidney disease versus acute kidney injury  Hypothyroidism  Glaucoma  Hospital day # [place] for Pager information 06/20/2015 10:47 AM  I have personally examined this patient, reviewed notes, independently viewed imaging studies, participated in medical decision making and plan of care. I have made any additions or clarifications directly to the above note. Agree with note above.  He presented with 40 mg episode of transient expressive aphasia likely due to left brain TIA and remains at risk for neurological worsening, recurrent stroke, TIA needs ongoing stroke evaluation and aggressive risk factor modification. I had a long discussion the patient and wife at the bedside and answered questions. Patient expressed interest in our discussion in the Parfait trial but given his mild renal insufficiency he does not qualify.  Antony Contras, MD Medical  Director [facility] Stroke Center Pager: [phone] 06/20/2015 3:54 PM    To contact Stroke Continuity provider, please refer to [email or web address] After hours, contact General Neurology

## 2015-06-20 NOTE — Progress Notes (Signed)
PT Cancellation Note  Patient Details Name: CORLISS LAMARTINA MRN: [id number] DOB: Aug 26, 1932   Cancelled Treatment:    Reason Eval/Treat Not Completed: Most recent activity order is for bedrest. Will await incr activity order prior to PT eval.   Soham Hollett 06/20/2015, 8:29 AM  Pager [phone]

## 2015-06-21 DIAGNOSIS — R490 Dysphonia: Secondary | ICD-10-CM | POA: Diagnosis not present

## 2015-06-21 LAB — HEMOGLOBIN A1C
Hgb A1c MFr Bld: 5.9 % — ABNORMAL HIGH (ref 4.8–5.6)
Mean Plasma Glucose: 123 mg/dL

## 2015-06-21 NOTE — Progress Notes (Signed)
STROKE TEAM PROGRESS NOTE   SUBJECTIVE (INTERVAL HISTORY) His wife is at the bedside.  She describes hx paraphasic errors at onset yesterday. Overall he feels his condition is completely resolved. He feels back to baseline   OBJECTIVE Temp:  [97.5 F (36.4 C)-99 F (37.2 C)] 97.5 F (36.4 C) (02/08 0624) Pulse Rate:  [51-57] 51 (02/08 0624) Cardiac Rhythm:  [-] Junctional rhythm (02/07 1900) Resp:  [16-20] 16 (02/08 0624) BP: (155-176)/(57-73) 157/57 mmHg (02/08 0624) SpO2:  [93 %-97 %] 93 % (02/08 0624)  CBC:   Recent Labs Lab 06/19/15 1517  WBC 4.3  NEUTROABS 2.2  HGB 13.5  HCT 40.8  MCV 94.7  PLT 103*    Basic Metabolic Panel:   Recent Labs Lab 06/19/15 1517  NA 142  K 4.3  CL 101  CO2 29  GLUCOSE 119*  BUN 19  CREATININE 1.53*  CALCIUM 9.6    Lipid Panel:     Component Value Date/Time   CHOL 126 06/20/2015 0341   TRIG 152* 06/20/2015 0341   HDL 30* 06/20/2015 0341   CHOLHDL 4.2 06/20/2015 0341   VLDL 30 06/20/2015 0341   LDLCALC 66 06/20/2015 0341   HgbA1c:  Lab Results  Component Value Date   HGBA1C 5.9* 06/20/2015   Urine Drug Screen: No results found for: LABOPIA, COCAINSCRNUR, LABBENZ, AMPHETMU, THCU, LABBARB    IMAGING  Ct Head Wo Contrast 06/19/2015   No acute intracranial abnormality. Atrophy, chronic microvascular disease.   Mr Brain Wo Contrast 06/20/2015   Normal MRI brain without contrast for age.   Mr Jodene Nam Head/brain Wo Cm 06/20/2015  Normal MRA head.   US Renal 06/20/2015   1. No evidence of hydronephrosis. Right kidney is unremarkable in appearance. Status post left-sided nephrectomy. 2. Enlarged prostate noted.   Carotid Doppler   There is no obvious evidence of hemodynamically significant internal carotid artery stenosis bilaterally. Vertebral arteries are patent with antegrade flow.  2D Echocardiogram  - Left ventricle: The cavity size was normal. Wall thickness wasincreased in a pattern of moderate LVH. Systolic function  wasnormal. The estimated ejection fraction was in the range of 60%to 65%. Wall motion was normal; there were no regional wallmotion abnormalities. Doppler parameters are consistent withabnormal left ventricular relaxation (grade 1 diastolicdysfunction). - Aortic valve: There was no stenosis. - Mitral valve: There was trivial regurgitation. - Left atrium: The atrium was mildly dilated. - Right ventricle: The cavity size was normal. Systolic functionwas normal. - Pulmonary arteries: No complete TR doppler jet so unable toestimate PA systolic pressure. - Inferior vena cava: The vessel was normal in size. Therespirophasic diameter changes were in the normal range (>= 50%),consistent with normal central venous pressure. Impressions:  - Normal LV size with moderate LV hypertrophy. EF 60-65%. Normal RVsize and systolic function. No signficant valvular abnormalities.   PHYSICAL EXAM Pleasant elderly Caucasian male currently not in distress. . Afebrile. Head is nontraumatic. Neck is supple without bruit.    Cardiac exam no murmur or gallop. Lungs are clear to auscultation. Distal pulses are well felt. Neurological Exam ;  Awake  Alert oriented x 3. Normal speech and language.eye movements full without nystagmus.fundi were not visualized. Vision acuity and fields appear normal. Hearing is normal. Palatal movements are normal. Face symmetric. Tongue midline. Normal strength, tone, reflexes and coordination. Normal sensation. Gait deferred. ASSESSMENT/PLAN Mr. DONTAVIUS KEIM is a 82 y.o. male with history of hyperlipidemia GERD, hypothyroidism, left renal cell carcinoma status post nephrectomy 2003, BPH and ulcerative colitis  presenting with difficulty speaking. He did not receive IV t-PA due to symptoms resolved, outside of the window.   L brain TIA  Resultant  Neurologic symptoms resolved  MRI  No acute stroke  MRA  nremarkable  Carotid Doppler  No significant stenosis   2D Echo  No source  of embolus   LDL 66  HgbA1c 5.9  Lovenox 40 mg sq daily for VTE prophylaxis Diet Heart Room service appropriate?: Yes; Fluid consistency:: Thin Diet - low sodium heart healthy  1/2 tab of aspirin 81 mg every other day - takes 1/2 tab every other day prior to admission (pt reports because he is on niacin and aspirin decreases its absorption), now on aspirin 81 mg daily  Patient counseled to be compliant with his antithrombotic medications  Ongoing aggressive stroke risk factor management  Patient works out 3 days/week at [employer].  Patient given information to consider the PARFAIT TRIAL. [facility] will followup.  Therapy recommendations:  No therapy needs  Disposition:  Return home  Stroke team will sign off  Follow up [facility] NP in 2 months. Order written.  Hypertension  Slightly elevated, 147s-170s  Hyperlipidemia  Home meds:  zocor 10 every other day, resumed in hospital  LDL 66, goal < 70  Continue statin at discharge  Other Stroke Risk Factors  Advanced age  Former Cigarette smoker, quit smoking 61 years ago   ETOH use  Other Active Problems  BPH  History of ulcerative colitis  GERD  Thrombocytopaenia  Chronic kidney disease versus acute kidney injury  Hypothyroidism  Glaucoma  Hospital day # [place] for Pager information 06/21/2015 11:16 AM  I have personally examined this patient, reviewed notes, independently viewed imaging studies, participated in medical decision making and plan of care. I have made any additions or clarifications directly to the above note. Agree with note above. Stroke team will sign off. Kindly follow-up as an outpatient in stroke clinic in 2 months.  Antony Contras, MD Medical Director [facility] Stroke Center Pager: [phone] 06/21/2015 1:28 PM    To contact Stroke Continuity provider, please refer to [email or web address] After hours, contact General  Neurology

## 2015-06-21 NOTE — Progress Notes (Signed)
Pt for discharge home today. Discharge orders received. IV and telemetry dcd. Discharge instructions and prescriptions given with verbalized understanding. Family at bedside to assist with discharge. Staff brought patient to lobby via wheelchair. Transported to home by family member. The patient refused both medications and physical assessment stating that he was told last night that he could go home if doppler study was WNL.Pricilla Larsson, RN

## 2015-06-22 DIAGNOSIS — Z6825 Body mass index (BMI) 25.0-25.9, adult: Secondary | ICD-10-CM | POA: Diagnosis not present

## 2015-06-22 DIAGNOSIS — G459 Transient cerebral ischemic attack, unspecified: Secondary | ICD-10-CM | POA: Diagnosis not present

## 2015-06-22 DIAGNOSIS — R001 Bradycardia, unspecified: Secondary | ICD-10-CM | POA: Diagnosis not present

## 2015-06-22 DIAGNOSIS — H8110 Benign paroxysmal vertigo, unspecified ear: Secondary | ICD-10-CM | POA: Diagnosis not present

## 2015-06-23 DIAGNOSIS — L570 Actinic keratosis: Secondary | ICD-10-CM | POA: Diagnosis not present

## 2015-06-23 DIAGNOSIS — D1801 Hemangioma of skin and subcutaneous tissue: Secondary | ICD-10-CM | POA: Diagnosis not present

## 2015-06-23 DIAGNOSIS — L812 Freckles: Secondary | ICD-10-CM | POA: Diagnosis not present

## 2015-06-23 DIAGNOSIS — Z85828 Personal history of other malignant neoplasm of skin: Secondary | ICD-10-CM | POA: Diagnosis not present

## 2015-06-23 DIAGNOSIS — L821 Other seborrheic keratosis: Secondary | ICD-10-CM | POA: Diagnosis not present

## 2015-06-23 DIAGNOSIS — D692 Other nonthrombocytopenic purpura: Secondary | ICD-10-CM | POA: Diagnosis not present

## 2015-07-25 ENCOUNTER — Encounter: Payer: Self-pay | Admitting: Nurse Practitioner

## 2015-07-25 ENCOUNTER — Ambulatory Visit (INDEPENDENT_AMBULATORY_CARE_PROVIDER_SITE_OTHER): Payer: Medicare HMO | Admitting: Nurse Practitioner

## 2015-07-25 VITALS — BP 146/77 | HR 66 | Ht 74.0 in | Wt 195.2 lb

## 2015-07-25 DIAGNOSIS — E785 Hyperlipidemia, unspecified: Secondary | ICD-10-CM

## 2015-07-25 DIAGNOSIS — I10 Essential (primary) hypertension: Secondary | ICD-10-CM

## 2015-07-25 DIAGNOSIS — G459 Transient cerebral ischemic attack, unspecified: Secondary | ICD-10-CM | POA: Diagnosis not present

## 2015-07-25 NOTE — Progress Notes (Signed)
[facility]  PATIENT: Joseph Hanna DOB: 1932/10/15   REASON FOR VISIT: Hospital follow-up for TIA HISTORY FROM: Patient    HISTORY OF PRESENT ILLNESS:Joseph Hanna, 82 year old male follows up after hospital admission for TIA on 06/19/2015. The patient presented to the emergency room after having a 40 minute episode of inability to speak and difficulty with finding the right words. He denied any problems with comprehension. He denies any visual problems or other focal sensory or motor symptoms or gait abnormality. Prior to hospitalization he was on  aspirin every other day and on Zocor every other day, he reports easy skin bruising but no recent bleeding problems. TPA was not administered secondary to symptoms resolved. MRI of the brain was normal, MRA was normal. Carotid Doppler without significant stenosis. 2-D echo with EF 60-65% no valvular abnormalities. LDL 66 .On today's visit he has not had further speech difficulty.  He continues to exercise 3 times a week at the gym. He does not smoke, occasional glass of wine.He returns for reevaluation   REVIEW OF SYSTEMS: Full 14 system review of systems performed and notable only for those listed, all others are neg:  Constitutional: neg  Cardiovascular: neg Ear/Nose/Throat: neg  Skin: neg Eyes: neg Respiratory: neg Gastroitestinal: neg  Hematology/Lymphatic:  Easy bruising Endocrine: neg Musculoskeletal:neg Allergy/Immunology: neg Neurological: neg Psychiatric: neg Sleep : neg   ALLERGIES: Allergies  Allergen Reactions  . Lipitor [Atorvastatin] Other (See Comments)    "severe muscle aches"  . Sporanox [Itraconazole] Rash    HOME MEDICATIONS: Outpatient Prescriptions Prior to Visit  Medication Sig Dispense Refill  . aspirin 81 MG tablet Take 1 tablet (81 mg total) by mouth daily. 30 tablet 0  . azaTHIOprine (IMURAN) 50 MG tablet Take 75 mg by mouth every morning. Takes 1  1/2  TABS    . brimonidine  (ALPHAGAN) 0.2 % ophthalmic solution Place 1 drop into both eyes 2 (two) times daily. Using 0.15% tid    . calcium carbonate (OS-CAL) 600 MG TABS Take 600 mg by mouth daily with breakfast.     . Cholecalciferol (VITAMIN D3) 1000 UNITS CAPS Take 2 capsules by mouth daily.     . Coenzyme Q10 (COQ10) 200 MG CAPS Take 1 capsule by mouth daily.    . fish oil-omega-3 fatty acids 1000 MG capsule Take 1 g by mouth daily.     . fluoruracil (CARAC) 0.5 % cream Apply 1 application topically 2 (two) times daily.     . Ginkgo Biloba 40 MG TABS Take 120 mg by mouth daily.     [name] latanoprost (XALATAN) 0.005 % ophthalmic solution Place 1 drop into the right eye at bedtime.    [name] levothyroxine (SYNTHROID, LEVOTHROID) 50 MCG tablet Take 50-100 mcg by mouth daily before breakfast. Take 61mcg everyday except Wednesday and Sunday take 160mcg.    . mesalamine (APRISO) 0.375 G 24 hr capsule Take 2,250 mg by mouth daily. TAKES 6 TABS DAILY--  TOTAL 2250MG     . mesalamine (CANASA) 1000 MG suppository Place 1,000 mg rectally at bedtime.    . metroNIDAZOLE (METROGEL) 0.75 % gel Apply 1 application topically 2 (two) times daily as needed (Rosacea).     . Multiple Vitamins-Minerals (EQ COMPLETE MULTIVIT ADULT 50+ PO) Take 1 tablet by mouth daily.    . niacin (SLO-NIACIN) 500 MG tablet Take 500 mg by mouth at bedtime.    . sildenafil (VIAGRA) 100 MG tablet Take 50 mg by mouth daily as needed for erectile  dysfunction.    . simvastatin (ZOCOR) 20 MG tablet Take 10 mg by mouth every other day. TAKES QHS    . urea (CARMOL) 40 % CREA Apply 20 mg topically daily. Reported on 07/25/2015    . timolol (BETIMOL) 0.5 % ophthalmic solution Place 1 drop into the right eye 2 (two) times daily. Reported on 07/25/2015    . timolol (TIMOPTIC) 0.5 % ophthalmic solution Place 1 drop into both eyes 2 (two) times daily. Reported on 07/25/2015     No facility-administered medications prior to visit.    PAST MEDICAL HISTORY: Past Medical History    Diagnosis Date  . Glaucoma     bilateral --  right uses rx drops and left eye trabeculectomy 2011  . Arthritis   . Wears glasses   . Complication of anesthesia     POST URINARY RETENTION  . BPH (benign prostatic hypertrophy)   . History of ulcerative colitis     in remission  . Wears glasses   . Hypothyroidism   . Hyperlipidemia   . History of renal cell cancer     S/P  LEFT NEPHRECTOMY  2003--  no recurrence  . History of gastroesophageal reflux (GERD)   . Lower leg pain     pt states has fibula-tibula syndrome--  goes to physical therapy  . Wears glasses   . ED (erectile dysfunction) of organic origin   . Rosacea   . Keratosis, actinic     PAST SURGICAL HISTORY: Past Surgical History  Procedure Laterality Date  . Trabeculectomy  2011    left eye (glaucoma)  . Shoulder arthroscopy Left LEFT  2012  . Colonoscopy  FEB 2015  . Orif clavicular fracture  12/03/2011    Procedure: OPEN REDUCTION INTERNAL FIXATION (ORIF) CLAVICULAR FRACTURE;  Surgeon: [name], MD;  Location: [facility];  Service: Orthopedics;  Laterality: Right;  . Nephrectomy Left 2003  . Lumbar laminectomy  1990  . Tonsillectomy  as child  . Cataract extraction w/ intraocular lens  implant, bilateral    . Green light laser turp (transurethral resection of prostate N/A 08/10/2013    Procedure: GREEN LIGHT LASER TURP (TRANSURETHRAL RESECTION OF PROSTATE;  Surgeon: [name], MD;  Location: [facility];  Service: Urology;  Laterality: N/A;    FAMILY HISTORY: Family History  Problem Relation Age of Onset  . Breast cancer Mother   . Emphysema Father   . Alzheimer's disease Brother     SOCIAL HISTORY: Social History   Social History  . Marital Status: Married    Spouse Name: N/A  . Number of Children: N/A  . Years of Education: N/A   Occupational History  . Not on file.   Social History Main Topics  . Smoking status: Former Smoker -- 6  years    Types: Cigarettes    Quit date: 12/01/1953  . Smokeless tobacco: Never Used  . Alcohol Use: 4.2 oz/week    7 Glasses of wine per week     Comment: one wine daily  . Drug Use: No  . Sexual Activity: Not on file   Other Topics Concern  . Not on file   Social History Narrative     PHYSICAL EXAM  Filed Vitals:   07/25/15 0835  BP: 146/77  Pulse: 66  Height: 6\' 2"  (1.88 m)  Weight: 195 lb 3.2 oz (88.542 kg)   Body mass index is 25.05 kg/(m^2).  Generalized: Well developed, in no acute distress  Head: normocephalic and atraumatic,. Oropharynx benign  Neck: Supple, no carotid bruits  Cardiac: Regular rate rhythm, no murmur  Musculoskeletal: No deformity   Skin no significant bruising  Neurological examination   Mentation: Alert oriented to time, place, history taking. Attention span and concentration appropriate. Recent and remote memory intact.  Follows all commands speech and language fluent.   Cranial nerve II-XII: Fundi were not visualized..Pupils were equal round reactive to light extraocular movements were full, visual field were full on confrontational test. Facial sensation and strength were normal. hearing was intact to finger rubbing bilaterally. Uvula tongue midline. head turning and shoulder shrug were normal and symmetric.Tongue protrusion into cheek strength was normal. Motor: normal bulk and tone, full strength in the BUE, BLE, fine finger movements normal, no pronator drift. No focal weakness Sensory: normal and symmetric to light touch, pinprick, and  Vibration,  In the upper and lower extremities Coordination: finger-nose-finger, heel-to-shin bilaterally, no dysmetria Reflexes: Brachioradialis 2/2, biceps 2/2, triceps 2/2, patellar 2/2, Achilles 2/2, plantar responses were flexor bilaterally. Gait and Station: Rising up from seated position without assistance, normal stance,  moderate stride, good arm swing, smooth turning, able to perform tiptoe, and  heel walking without difficulty. Tandem gait is steady. No assistive device  DIAGNOSTIC DATA (LABS, IMAGING, TESTING) - I reviewed patient records, labs, notes, testing and imaging myself where available.  Lab Results  Component Value Date   WBC 4.3 06/19/2015   HGB 13.5 06/19/2015   HCT 40.8 06/19/2015   MCV 94.7 06/19/2015   PLT 103* 06/19/2015      Component Value Date/Time   NA 142 06/19/2015 1517   K 4.3 06/19/2015 1517   CL 101 06/19/2015 1517   CO2 29 06/19/2015 1517   GLUCOSE 119* 06/19/2015 1517   BUN 19 06/19/2015 1517   CREATININE 1.53* 06/19/2015 1517   CALCIUM 9.6 06/19/2015 1517   PROT 6.6 06/19/2015 1517   ALBUMIN 4.2 06/19/2015 1517   AST 26 06/19/2015 1517   ALT 21 06/19/2015 1517   ALKPHOS 36* 06/19/2015 1517   BILITOT 0.9 06/19/2015 1517   GFRNONAA 41* 06/19/2015 1517   GFRAA 48* 06/19/2015 1517   Lab Results  Component Value Date   CHOL 126 06/20/2015   HDL 30* 06/20/2015   LDLCALC 66 06/20/2015   TRIG 152* 06/20/2015   CHOLHDL 4.2 06/20/2015   Lab Results  Component Value Date   HGBA1C 5.9* 06/20/2015   No results found for: [id number] Lab Results  Component Value Date   TSH 5.055* 06/20/2015      ASSESSMENT AND PLAN  83 y.o. year old male  has a past medical history of Hyperlipidemia; , mild hypertension and recent TIA with hospital admission. MRI of the brain was normal, MRA was normal. Carotid Doppler without significant stenosis. 2-D echo with EF 60-65% no valvular abnormalities. LDL 66 . The patient is a current patient of Dr. Leonie Man  who is out of the office today . This note is sent to the work in doctor.     PLAN: Control of risk factors B/P less than  [id number] systolic today's reading [id number]  Continue aspirin 0.81 for secondary stroke prevention Continue Zocor for secondary stroke prevention lipids followed by PCP dr. Joylene Draft Continue working out at [employer] for flexibility,  and overall health  Healthy diet whole grains,  fresh  fruits and vegetables , lean meats Given a patient handout on TIA and reviewed this with him Follow-up in 3 months Vst time 25 min  Dennie Bible, [facility], [facility], APRN  [facility] [address] [place], [place] [address] [phone]

## 2015-07-25 NOTE — Patient Instructions (Signed)
Control of risk factors B/P less than  [id number] systolic today's reading [id number]  Continue aspirin 0.81 for secondary stroke prevention Continue Zocor for secondary stroke prevention  Continue working out at [employer] for flexibility,  and overall health  Healthy diet whole gr fresh fruits and vegetables , lean meats  follow-up in 3 months

## 2015-08-01 NOTE — Progress Notes (Signed)
I reviewed note and agree with plan.   Lebaron Bautch R. Monaye Blackie, MD  Certified in Neurology, Neurophysiology and Neuroimaging  [facility] [address], [address] [place], [place] [address] [phone]   

## 2015-08-15 DIAGNOSIS — R69 Illness, unspecified: Secondary | ICD-10-CM | POA: Diagnosis not present

## 2015-08-17 DIAGNOSIS — H47233 Glaucomatous optic atrophy, bilateral: Secondary | ICD-10-CM | POA: Diagnosis not present

## 2015-08-17 DIAGNOSIS — H401133 Primary open-angle glaucoma, bilateral, severe stage: Secondary | ICD-10-CM | POA: Diagnosis not present

## 2015-09-26 DIAGNOSIS — R310 Gross hematuria: Secondary | ICD-10-CM | POA: Diagnosis not present

## 2015-09-26 DIAGNOSIS — Z0000 Encounter for general adult medical examination without abnormal findings: Secondary | ICD-10-CM | POA: Diagnosis not present

## 2015-10-03 DIAGNOSIS — R310 Gross hematuria: Secondary | ICD-10-CM | POA: Diagnosis not present

## 2015-10-20 DIAGNOSIS — R310 Gross hematuria: Secondary | ICD-10-CM | POA: Diagnosis not present

## 2015-10-20 DIAGNOSIS — R972 Elevated prostate specific antigen [PSA]: Secondary | ICD-10-CM | POA: Diagnosis not present

## 2015-10-20 DIAGNOSIS — N210 Calculus in bladder: Secondary | ICD-10-CM | POA: Diagnosis not present

## 2015-10-20 DIAGNOSIS — N400 Enlarged prostate without lower urinary tract symptoms: Secondary | ICD-10-CM | POA: Diagnosis not present

## 2015-10-24 ENCOUNTER — Ambulatory Visit (INDEPENDENT_AMBULATORY_CARE_PROVIDER_SITE_OTHER): Payer: Medicare HMO | Admitting: Nurse Practitioner

## 2015-10-24 ENCOUNTER — Encounter: Payer: Self-pay | Admitting: Nurse Practitioner

## 2015-10-24 VITALS — BP 141/70 | HR 55 | Ht 74.0 in | Wt 188.2 lb

## 2015-10-24 DIAGNOSIS — G459 Transient cerebral ischemic attack, unspecified: Secondary | ICD-10-CM

## 2015-10-24 DIAGNOSIS — E785 Hyperlipidemia, unspecified: Secondary | ICD-10-CM

## 2015-10-24 DIAGNOSIS — I10 Essential (primary) hypertension: Secondary | ICD-10-CM | POA: Diagnosis not present

## 2015-10-24 NOTE — Progress Notes (Addendum)
[facility]  PATIENT: Joseph Hanna DOB: 1932-11-24   REASON FOR VISIT: Follow-up for TIA, hyperlipidemia and essential hypertension HISTORY FROM: Patient    HISTORY OF PRESENT ILLNESS:UPDATE 10/24/15 CM Mr Ramthun, 82 year old male returns for follow-up. He has a history of admission for TIA in February 2017. He is now on aspirin daily without significant skin bruising. He has not had further stroke or TIA symptoms. He remains on Zocor. He had cystoscopy last week for blood in his urine. He also has prisms in his glasses for history of double vision, also has glaucoma. Continues to go to the gym 3 times a week. No new neurologic complaints He returns for reevaluation  HISTORY 07/25/15 CM Mr Knaak, 82 year old male follows up after hospital admission for TIA on 06/19/2015. The patient presented to the emergency room after having a 40 minute episode of inability to speak and difficulty with finding the right words. He denied any problems with comprehension. He denies any visual problems or other focal sensory or motor symptoms or gait abnormality. Prior to hospitalization he was on aspirin every other day and on Zocor every other day, he reports easy skin bruising but no recent bleeding problems. TPA was not administered secondary to symptoms resolved. MRI of the brain was normal, MRA was normal. Carotid Doppler without significant stenosis. 2-D echo with EF 60-65% no valvular abnormalities. LDL 66 .On today's visit he has not had further speech difficulty. He continues to exercise 3 times a week at the gym. He does not smoke, occasional glass of wine.He returns for reevaluation REVIEW OF SYSTEMS: Full 14 system review of systems performed and notable only for those listed, all others are neg:  Constitutional: neg  Cardiovascular: neg Ear/Nose/Throat: neg  Skin: neg Eyes: History of double vision with prisms in his glasses Respiratory: neg Gastroitestinal: neg    Hematology/Lymphatic: neg  Endocrine: neg Musculoskeletal: Joint pain Allergy/Immunology: neg Neurological: neg Psychiatric: neg Sleep : neg   ALLERGIES: Allergies  Allergen Reactions  . Lipitor [Atorvastatin] Other (See Comments)    "severe muscle aches"  . Sporanox [Itraconazole] Rash    HOME MEDICATIONS: Outpatient Prescriptions Prior to Visit  Medication Sig Dispense Refill  . aspirin 81 MG tablet Take 1 tablet (81 mg total) by mouth daily. 30 tablet 0  . azaTHIOprine (IMURAN) 50 MG tablet Take 75 mg by mouth every morning. Takes 1  1/2  TABS    . brimonidine (ALPHAGAN) 0.2 % ophthalmic solution Place 1 drop into the right eye 2 (two) times daily. Using 0.15% tid    . calcium carbonate (OS-CAL) 600 MG TABS Take 600 mg by mouth daily with breakfast.     . Cholecalciferol (VITAMIN D3) 1000 UNITS CAPS Take 2 capsules by mouth daily.     . Coenzyme Q10 (COQ10) 200 MG CAPS Take 1 capsule by mouth daily.    . fish oil-omega-3 fatty acids 1000 MG capsule Take 1 g by mouth daily.     . fluoruracil (CARAC) 0.5 % cream Apply 1 application topically 2 (two) times daily.     . Ginkgo Biloba 40 MG TABS Take 120 mg by mouth daily.     [name] latanoprost (XALATAN) 0.005 % ophthalmic solution Place 1 drop into the right eye at bedtime.    [name] levothyroxine (SYNTHROID, LEVOTHROID) 50 MCG tablet Take 50-100 mcg by mouth daily before breakfast. Take 59mg everyday except Wednesday and Sunday take 10102m.    . mesalamine (APRISO) 0.375 G 24 hr capsule Take  2,250 mg by mouth daily. TAKES 6 TABS DAILY--  TOTAL 2250MG    . mesalamine (CANASA) 1000 MG suppository Place 1,000 mg rectally at bedtime.    . metroNIDAZOLE (METROGEL) 0.75 % gel Apply 1 application topically 2 (two) times daily as needed (Rosacea).     . Multiple Vitamins-Minerals (EQ COMPLETE MULTIVIT ADULT 50+ PO) Take 1 tablet by mouth daily.    . niacin (SLO-NIACIN) 500 MG tablet Take 500 mg by mouth at bedtime.    . sildenafil (VIAGRA) 100  MG tablet Take 50 mg by mouth daily as needed for erectile dysfunction.    . simvastatin (ZOCOR) 20 MG tablet Take 10 mg by mouth every other day. TAKES QHS    . urea (CARMOL) 40 % CREA Apply 20 mg topically daily. Reported on 07/25/2015     No facility-administered medications prior to visit.    PAST MEDICAL HISTORY: Past Medical History  Diagnosis Date  . Glaucoma     bilateral --  right uses rx drops and left eye trabeculectomy 2011  . Arthritis   . Wears glasses   . Complication of anesthesia     POST URINARY RETENTION  . BPH (benign prostatic hypertrophy)   . History of ulcerative colitis     in remission  . Wears glasses   . Hypothyroidism   . Hyperlipidemia   . History of renal cell cancer     S/P  LEFT NEPHRECTOMY  2003--  no recurrence  . History of gastroesophageal reflux (GERD)   . Lower leg pain     pt states has fibula-tibula syndrome--  goes to physical therapy  . Wears glasses   . ED (erectile dysfunction) of organic origin   . Rosacea   . Keratosis, actinic     PAST SURGICAL HISTORY: Past Surgical History  Procedure Laterality Date  . Trabeculectomy  2011    left eye (glaucoma)  . Shoulder arthroscopy Left LEFT  2012  . Colonoscopy  FEB 2015  . Orif clavicular fracture  12/03/2011    Procedure: OPEN REDUCTION INTERNAL FIXATION (ORIF) CLAVICULAR FRACTURE;  Surgeon: [name], MD;  Location: [facility];  Service: Orthopedics;  Laterality: Right;  . Nephrectomy Left 2003  . Lumbar laminectomy  1990  . Tonsillectomy  as child  . Cataract extraction w/ intraocular lens  implant, bilateral    . Green light laser turp (transurethral resection of prostate N/A 08/10/2013    Procedure: GREEN LIGHT LASER TURP (TRANSURETHRAL RESECTION OF PROSTATE;  Surgeon: [name], MD;  Location: [facility];  Service: Urology;  Laterality: N/A;    FAMILY HISTORY: Family History  Problem Relation Age of Onset  . Breast  cancer Mother   . Emphysema Father   . Alzheimer's disease Brother     SOCIAL HISTORY: Social History   Social History  . Marital Status: Married    Spouse Name: N/A  . Number of Children: N/A  . Years of Education: N/A   Occupational History  . Not on file.   Social History Main Topics  . Smoking status: Former Smoker -- 6 years    Types: Cigarettes    Quit date: 12/01/1953  . Smokeless tobacco: Never Used  . Alcohol Use: 4.2 oz/week    7 Glasses of wine per week     Comment: one wine daily  . Drug Use: No  . Sexual Activity: Not on file   Other Topics Concern  . Not on file  Social History Narrative     PHYSICAL EXAM  Filed Vitals:   10/24/15 0805  BP: 141/70  Pulse: 55  Height: 6' 2"  (1.88 m)  Weight: 188 lb 3.2 oz (85.367 kg)   Body mass index is 24.15 kg/(m^2). Generalized: Well developed, in no acute distress  Head: normocephalic and atraumatic,. Oropharynx benign  Neck: Supple, no carotid bruits  Cardiac: Regular rate rhythm, no murmur  Musculoskeletal: No deformity  Skin no significant bruising  Neurological examination   Mentation: Alert oriented to time, place, history taking. Attention span and concentration appropriate. Recent and remote memory intact. Follows all commands speech and language fluent.   Cranial nerve II-XII: Pupils were equal round reactive to light extraocular movements were full, visual field were full on confrontational test. Facial sensation and strength were normal. hearing was intact to finger rubbing bilaterally. Uvula tongue midline. head turning and shoulder shrug were normal and symmetric.Tongue protrusion into cheek strength was normal. Motor: normal bulk and tone, full strength in the BUE, BLE, fine finger movements normal, no pronator drift. No focal weakness Sensory: normal and symmetric to light touch, pinprick, and Vibration, In the upper and lower extremities Coordination: finger-nose-finger,  heel-to-shin bilaterally, no dysmetria Reflexes: Brachioradialis 2/2, biceps 2/2, triceps 2/2, patellar 2/2, Achilles 2/2, plantar responses were flexor bilaterally. Gait and Station: Rising up from seated position without assistance, normal stance, moderate stride, good arm swing, smooth turning, able to perform tiptoe, and heel walking without difficulty. Tandem gait is steady. No assistive device  DIAGNOSTIC DATA (LABS, IMAGING, TESTING) - I reviewed patient records, labs, notes, testing and imaging myself where available.  Lab Results  Component Value Date   WBC 4.3 06/19/2015   HGB 13.5 06/19/2015   HCT 40.8 06/19/2015   MCV 94.7 06/19/2015   PLT 103* 06/19/2015      Component Value Date/Time   NA 142 06/19/2015 1517   K 4.3 06/19/2015 1517   CL 101 06/19/2015 1517   CO2 29 06/19/2015 1517   GLUCOSE 119* 06/19/2015 1517   BUN 19 06/19/2015 1517   CREATININE 1.53* 06/19/2015 1517   CALCIUM 9.6 06/19/2015 1517   PROT 6.6 06/19/2015 1517   ALBUMIN 4.2 06/19/2015 1517   AST 26 06/19/2015 1517   ALT 21 06/19/2015 1517   ALKPHOS 36* 06/19/2015 1517   BILITOT 0.9 06/19/2015 1517   GFRNONAA 41* 06/19/2015 1517   GFRAA 48* 06/19/2015 1517   Lab Results  Component Value Date   CHOL 126 06/20/2015   HDL 30* 06/20/2015   LDLCALC 66 06/20/2015   TRIG 152* 06/20/2015   CHOLHDL 4.2 06/20/2015   Lab Results  Component Value Date   HGBA1C 5.9* 06/20/2015   No results found for: VITAMINB12 Lab Results  Component Value Date   TSH 5.055* 06/20/2015      ASSESSMENT AND PLAN 82 y.o. year old male has a past medical history of Hyperlipidemia; , mild hypertension and recent TIA with hospital admission Feb 2017.  MRI of the brain was normal, MRA was normal. Carotid Doppler without significant stenosis. 2-D echo with EF 60-65% no valvular abnormalities. LDL 66 . The patient is a current patient of Dr. Leonie Man who is out of the office today . This note is sent to the work in  doctor.   PLAN: Control of risk factors B/P less than 939 systolic today's reading 030/09 Continue aspirin 81 for secondary stroke prevention Continue Zocor for secondary stroke prevention lipids followed by PCP Dr. Joylene Draft labs due in October  Continue working out at [employer] for flexibility, and overall health  Healthy diet whole grains, fresh fruits and vegetables , lean meats Follow-up in 6 months Dennie Bible, [facility], [facility], [facility] [address], [place], [place] [address] [phone]  Personally reviewed plan as stated above and agree.  I have personally reviewed the history, evaluated lab date, reviewed imaging studies and agree with radiology interpretations.

## 2015-10-24 NOTE — Patient Instructions (Signed)
B/P 141/70 on today's visit  Continue aspirin 0.81 for secondary stroke prevention Continue Zocor for secondary stroke prevention lipids followed by PCP dr. Joylene Draft Continue working out at [employer] for flexibility, and overall health  Healthy diet whole grains, fresh fruits and vegetables , lean meats Follow-up in 6 months

## 2015-11-10 DIAGNOSIS — Z0000 Encounter for general adult medical examination without abnormal findings: Secondary | ICD-10-CM | POA: Diagnosis not present

## 2015-11-10 DIAGNOSIS — E7800 Pure hypercholesterolemia, unspecified: Secondary | ICD-10-CM | POA: Diagnosis not present

## 2015-11-10 DIAGNOSIS — K219 Gastro-esophageal reflux disease without esophagitis: Secondary | ICD-10-CM | POA: Diagnosis not present

## 2015-11-10 DIAGNOSIS — K5190 Ulcerative colitis, unspecified, without complications: Secondary | ICD-10-CM | POA: Diagnosis not present

## 2015-11-10 DIAGNOSIS — E039 Hypothyroidism, unspecified: Secondary | ICD-10-CM | POA: Diagnosis not present

## 2015-11-22 DIAGNOSIS — H52223 Regular astigmatism, bilateral: Secondary | ICD-10-CM | POA: Diagnosis not present

## 2015-11-22 DIAGNOSIS — Z0100 Encounter for examination of eyes and vision without abnormal findings: Secondary | ICD-10-CM | POA: Diagnosis not present

## 2015-11-22 DIAGNOSIS — H401131 Primary open-angle glaucoma, bilateral, mild stage: Secondary | ICD-10-CM | POA: Diagnosis not present

## 2015-11-22 DIAGNOSIS — H5212 Myopia, left eye: Secondary | ICD-10-CM | POA: Diagnosis not present

## 2015-11-22 DIAGNOSIS — H5201 Hypermetropia, right eye: Secondary | ICD-10-CM | POA: Diagnosis not present

## 2016-01-03 DIAGNOSIS — H401133 Primary open-angle glaucoma, bilateral, severe stage: Secondary | ICD-10-CM | POA: Diagnosis not present

## 2016-01-03 DIAGNOSIS — H47233 Glaucomatous optic atrophy, bilateral: Secondary | ICD-10-CM | POA: Diagnosis not present

## 2016-01-16 DIAGNOSIS — C44319 Basal cell carcinoma of skin of other parts of face: Secondary | ICD-10-CM | POA: Diagnosis not present

## 2016-01-16 DIAGNOSIS — Z85828 Personal history of other malignant neoplasm of skin: Secondary | ICD-10-CM | POA: Diagnosis not present

## 2016-01-16 DIAGNOSIS — L812 Freckles: Secondary | ICD-10-CM | POA: Diagnosis not present

## 2016-01-16 DIAGNOSIS — L821 Other seborrheic keratosis: Secondary | ICD-10-CM | POA: Diagnosis not present

## 2016-01-16 DIAGNOSIS — D692 Other nonthrombocytopenic purpura: Secondary | ICD-10-CM | POA: Diagnosis not present

## 2016-01-16 DIAGNOSIS — L570 Actinic keratosis: Secondary | ICD-10-CM | POA: Diagnosis not present

## 2016-01-16 DIAGNOSIS — L720 Epidermal cyst: Secondary | ICD-10-CM | POA: Diagnosis not present

## 2016-01-16 DIAGNOSIS — D1801 Hemangioma of skin and subcutaneous tissue: Secondary | ICD-10-CM | POA: Diagnosis not present

## 2016-01-16 DIAGNOSIS — D485 Neoplasm of uncertain behavior of skin: Secondary | ICD-10-CM | POA: Diagnosis not present

## 2016-01-27 DIAGNOSIS — Z23 Encounter for immunization: Secondary | ICD-10-CM | POA: Diagnosis not present

## 2016-01-30 DIAGNOSIS — C44119 Basal cell carcinoma of skin of left eyelid, including canthus: Secondary | ICD-10-CM | POA: Diagnosis not present

## 2016-01-30 DIAGNOSIS — Z85828 Personal history of other malignant neoplasm of skin: Secondary | ICD-10-CM | POA: Diagnosis not present

## 2016-02-16 DIAGNOSIS — Z125 Encounter for screening for malignant neoplasm of prostate: Secondary | ICD-10-CM | POA: Diagnosis not present

## 2016-02-16 DIAGNOSIS — E038 Other specified hypothyroidism: Secondary | ICD-10-CM | POA: Diagnosis not present

## 2016-02-16 DIAGNOSIS — E784 Other hyperlipidemia: Secondary | ICD-10-CM | POA: Diagnosis not present

## 2016-02-16 DIAGNOSIS — R7301 Impaired fasting glucose: Secondary | ICD-10-CM | POA: Diagnosis not present

## 2016-02-23 DIAGNOSIS — Z0000 Encounter for general adult medical examination without abnormal findings: Secondary | ICD-10-CM | POA: Diagnosis not present

## 2016-02-23 DIAGNOSIS — N189 Chronic kidney disease, unspecified: Secondary | ICD-10-CM | POA: Diagnosis not present

## 2016-02-23 DIAGNOSIS — G458 Other transient cerebral ischemic attacks and related syndromes: Secondary | ICD-10-CM | POA: Diagnosis not present

## 2016-02-23 DIAGNOSIS — N401 Enlarged prostate with lower urinary tract symptoms: Secondary | ICD-10-CM | POA: Diagnosis not present

## 2016-02-23 DIAGNOSIS — R001 Bradycardia, unspecified: Secondary | ICD-10-CM | POA: Diagnosis not present

## 2016-02-23 DIAGNOSIS — I7389 Other specified peripheral vascular diseases: Secondary | ICD-10-CM | POA: Diagnosis not present

## 2016-02-23 DIAGNOSIS — E784 Other hyperlipidemia: Secondary | ICD-10-CM | POA: Diagnosis not present

## 2016-02-23 DIAGNOSIS — K5190 Ulcerative colitis, unspecified, without complications: Secondary | ICD-10-CM | POA: Diagnosis not present

## 2016-02-23 DIAGNOSIS — C649 Malignant neoplasm of unspecified kidney, except renal pelvis: Secondary | ICD-10-CM | POA: Diagnosis not present

## 2016-02-23 DIAGNOSIS — D692 Other nonthrombocytopenic purpura: Secondary | ICD-10-CM | POA: Diagnosis not present

## 2016-03-06 DIAGNOSIS — L570 Actinic keratosis: Secondary | ICD-10-CM | POA: Diagnosis not present

## 2016-04-16 DIAGNOSIS — H401133 Primary open-angle glaucoma, bilateral, severe stage: Secondary | ICD-10-CM | POA: Diagnosis not present

## 2016-04-16 DIAGNOSIS — H47233 Glaucomatous optic atrophy, bilateral: Secondary | ICD-10-CM | POA: Diagnosis not present

## 2016-04-23 ENCOUNTER — Encounter: Payer: Self-pay | Admitting: Nurse Practitioner

## 2016-04-23 ENCOUNTER — Ambulatory Visit (INDEPENDENT_AMBULATORY_CARE_PROVIDER_SITE_OTHER): Payer: Medicare HMO | Admitting: Nurse Practitioner

## 2016-04-23 VITALS — BP 126/62 | HR 64 | Ht 74.0 in | Wt 194.8 lb

## 2016-04-23 DIAGNOSIS — E785 Hyperlipidemia, unspecified: Secondary | ICD-10-CM | POA: Diagnosis not present

## 2016-04-23 DIAGNOSIS — I10 Essential (primary) hypertension: Secondary | ICD-10-CM

## 2016-04-23 DIAGNOSIS — G459 Transient cerebral ischemic attack, unspecified: Secondary | ICD-10-CM

## 2016-04-23 NOTE — Progress Notes (Signed)
[facility]  PATIENT: Joseph Hanna DOB: August 30, 1932   REASON FOR VISIT: Follow-up for TIA, hyperlipidemia and essential hypertension HISTORY FROM: Patient    HISTORY OF PRESENT ILLNESS:UPDATE 12/12/2017CM Joseph Hanna, 82 year old male returns for follow-up. He has a history of TIA on 06/19/2015 with a 40 minute period of inability to speak and word finding difficulty. MRI of the brain was normal. MRA was normal. Carotid Doppler without significant stenosis. He returns today for follow-up without further stroke or TIA symptoms since that time. He remains on baby aspirin every day with no bruising and no bleeding. He is on Zocor and his labs are followed by his primary care Dr. Joylene Draft. He denies any muscle aches. He continues to go to the gym 3 times a week. He has no new neurologic complaints he returns for reevaluation   UPDATE 10/24/15 CM Joseph Hanna, 82 year old male returns for follow-up. He has a history of admission for TIA in February 2017. He is now on aspirin daily without significant skin bruising. He has not had further stroke or TIA symptoms. He remains on Zocor. He had cystoscopy last week for blood in his urine. He also has prisms in his glasses for history of double vision, also has glaucoma. Continues to go to the gym 3 times a week. No new neurologic complaints He returns for reevaluation  HISTORY 07/25/15 CM Joseph Hanna, 82 year old male follows up after hospital admission for TIA on 06/19/2015. The patient presented to the emergency room after having a 40 minute episode of inability to speak and difficulty with finding the right words. He denied any problems with comprehension. He denies any visual problems or other focal sensory or motor symptoms or gait abnormality. Prior to hospitalization he was on aspirin every other day and on Zocor every other day, he reports easy skin bruising but no recent bleeding problems. TPA was not administered secondary to symptoms  resolved. MRI of the brain was normal, MRA was normal. Carotid Doppler without significant stenosis. 2-D echo with EF 60-65% no valvular abnormalities. LDL 66 .On today's visit he has not had further speech difficulty. He continues to exercise 3 times a week at the gym. He does not smoke, occasional glass of wine.He returns for reevaluation REVIEW OF SYSTEMS: Full 14 system review of systems performed and notable only for those listed, all others are neg:  Constitutional: neg  Cardiovascular: neg Ear/Nose/Throat: neg  Skin: neg Eyes: History of double vision with prisms in his glasses Respiratory: neg Gastroitestinal: neg  Hematology/Lymphatic: neg  Endocrine: neg Musculoskeletal: Joint pain Allergy/Immunology: neg Neurological: neg Psychiatric: neg Sleep : neg   ALLERGIES: Allergies  Allergen Reactions  . Lipitor [Atorvastatin] Other (See Comments)    "severe muscle aches"  . Sporanox [Itraconazole] Rash    HOME MEDICATIONS: Outpatient Medications Prior to Visit  Medication Sig Dispense Refill  . aspirin 81 MG tablet Take 1 tablet (81 mg total) by mouth daily. 30 tablet 0  . azaTHIOprine (IMURAN) 50 MG tablet Take 75 mg by mouth every morning. Takes 1  1/2  TABS    . calcium carbonate (OS-CAL) 600 MG TABS Take 600 mg by mouth daily with breakfast.     . Cholecalciferol (VITAMIN D3) 1000 UNITS CAPS Take 2 capsules by mouth daily.     . Coenzyme Q10 (COQ10) 200 MG CAPS Take 1 capsule by mouth daily.    . fish oil-omega-3 fatty acids 1000 MG capsule Take 1 g by mouth daily.     [name]  fluoruracil (CARAC) 0.5 % cream Apply 1 application topically 2 (two) times daily.     . Ginkgo Biloba 40 MG TABS Take 120 mg by mouth daily.     [name] latanoprost (XALATAN) 0.005 % ophthalmic solution Place 1 drop into the right eye at bedtime.    [name] levothyroxine (SYNTHROID, LEVOTHROID) 50 MCG tablet Take 50-100 mcg by mouth daily before breakfast. Take 28mg everyday except Wednesday and Sunday take  1039m.    . mesalamine (APRISO) 0.375 G 24 hr capsule Take 2,250 mg by mouth daily. TAKES 6 TABS DAILY--  TOTAL 2250MG    . mesalamine (CANASA) 1000 MG suppository Place 1,000 mg rectally at bedtime.    . metroNIDAZOLE (METROGEL) 0.75 % gel Apply 1 application topically 2 (two) times daily as needed (Rosacea).     . Multiple Vitamins-Minerals (EQ COMPLETE MULTIVIT ADULT 50+ PO) Take 1 tablet by mouth daily.    . niacin (SLO-NIACIN) 500 MG tablet Take 500 mg by mouth at bedtime.    . sildenafil (VIAGRA) 100 MG tablet Take 50 mg by mouth daily as needed for erectile dysfunction.    . simvastatin (ZOCOR) 20 MG tablet Take 10 mg by mouth every other day. TAKES QHS    . urea (CARMOL) 20 % cream Apply topically as needed.    . brimonidine (ALPHAGAN) 0.2 % ophthalmic solution Place 1 drop into the right eye 2 (two) times daily. Using 0.15% tid     No facility-administered medications prior to visit.     PAST MEDICAL HISTORY: Past Medical History:  Diagnosis Date  . Arthritis   . BPH (benign prostatic hypertrophy)   . Complication of anesthesia    POST URINARY RETENTION  . ED (erectile dysfunction) of organic origin   . Glaucoma    bilateral --  right uses rx drops and left eye trabeculectomy 2011  . History of gastroesophageal reflux (GERD)   . History of renal cell cancer    S/P  LEFT NEPHRECTOMY  2003--  no recurrence  . History of ulcerative colitis    in remission  . Hyperlipidemia   . Hypothyroidism   . Keratosis, actinic   . 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 Rosacea   . Wears glasses   . Wears glasses   . Wears glasses     PAST SURGICAL HISTORY: Past Surgical History:  Procedure Laterality Date  . CATARACT EXTRACTION W/ INTRAOCULAR LENS  IMPLANT, BILATERAL    . COLONOSCOPY  FEB 2015  . GREEN LIGHT LASER TURP (TRANSURETHRAL RESECTION OF PROSTATE N/A 08/10/2013   Procedure: GREEN LIGHT LASER TURP (TRANSURETHRAL RESECTION  OF PROSTATE;  Surgeon: MaFredricka BonineMD;  [facility] Service: Urology;  Laterality: N/A;  . [place]. NEPHRECTOMY Left 2003  . ORIF CLAVICULAR FRACTURE  12/03/2011   Procedure: OPEN REDUCTION INTERNAL FIXATION (ORIF) CLAVICULAR FRACTURE;  Surgeon: JuNita SellsMD;  [facility] Service: Orthopedics;  Laterality: Right;  . SHOULDER ARTHROSCOPY Left LEFT  2012  . TONSILLECTOMY  as child  . TRABECULECTOMY  2011   left eye (glaucoma)    FAMILY HISTORY: Family History  Problem Relation Age of Onset  . Breast cancer Mother   . Emphysema Father   . Alzheimer's disease Brother     SOCIAL HISTORY: Social History   Social History  . Marital status: Married    Spouse  name: N/A  . Number of children: N/A  . Years of education: N/A   Occupational History  . Not on file.   Social History Main Topics  . Smoking status: Former Smoker    Years: 6.00    Types: Cigarettes    Quit date: 12/01/1953  . Smokeless tobacco: Never Used  . Alcohol use 4.2 oz/week    7 Glasses of wine per week     Comment: one wine daily  . Drug use: No  . Sexual activity: Not on file   Other Topics Concern  . Not on file   Social History Narrative  . No narrative on file     PHYSICAL EXAM  Vitals:   04/23/16 0826  BP: 126/62  Pulse: 64  Weight: 194 lb 12.8 oz (88.4 kg)  Height: 6' 2"  (1.88 m)   Body mass index is 25.01 kg/m. Generalized: Well developed, in no acute distress  Head: normocephalic and atraumatic,. Oropharynx benign  Neck: Supple, no carotid bruits  Cardiac: Regular rate rhythm, no murmur  Musculoskeletal: No deformity  Skin no significant bruising  Neurological examination   Mentation: Alert oriented to time, place, history taking. Attention span and concentration appropriate. Recent and remote memory intact. Follows all commands speech and language fluent.   Cranial nerve  II-XII: Pupils were equal round reactive to light extraocular movements were full, visual field were full on confrontational test. Facial sensation and strength were normal. hearing was intact to finger rubbing bilaterally. Uvula tongue midline. head turning and shoulder shrug were normal and symmetric.Tongue protrusion into cheek strength was normal. Motor: normal bulk and tone, full strength in the BUE, BLE, fine finger movements normal, no pronator drift. No focal weakness Sensory: normal and symmetric to light touch, pinprick, and Vibration, In the upper and lower extremities Coordination: finger-nose-finger, heel-to-shin bilaterally, no dysmetria Reflexes: Brachioradialis 2/2, biceps 2/2, triceps 2/2, patellar 2/2, Achilles 2/2, plantar responses were flexor bilaterally. Gait and Station: Rising up from seated position without assistance, normal stance, moderate stride, good arm swing, smooth turning, able to perform tiptoe, and heel walking without difficulty. Tandem gait is steady. No assistive device  DIAGNOSTIC DATA (LABS, IMAGING, TESTING) - I reviewed patient records, labs, notes, testing and imaging myself where available.  Lab Results  Component Value Date   WBC 4.3 06/19/2015   HGB 13.5 06/19/2015   HCT 40.8 06/19/2015   MCV 94.7 06/19/2015   PLT 103 (L) 06/19/2015      Component Value Date/Time   NA 142 06/19/2015 1517   K 4.3 06/19/2015 1517   CL 101 06/19/2015 1517   CO2 29 06/19/2015 1517   GLUCOSE 119 (H) 06/19/2015 1517   BUN 19 06/19/2015 1517   CREATININE 1.53 (H) 06/19/2015 1517   CALCIUM 9.6 06/19/2015 1517   PROT 6.6 06/19/2015 1517   ALBUMIN 4.2 06/19/2015 1517   AST 26 06/19/2015 1517   ALT 21 06/19/2015 1517   ALKPHOS 36 (L) 06/19/2015 1517   BILITOT 0.9 06/19/2015 1517   GFRNONAA 41 (L) 06/19/2015 1517   GFRAA 48 (L) 06/19/2015 1517   Lab Results  Component Value Date   CHOL 126 06/20/2015   HDL 30 (L) 06/20/2015   LDLCALC 66 06/20/2015   TRIG  152 (H) 06/20/2015   CHOLHDL 4.2 06/20/2015   Lab Results  Component Value Date   HGBA1C 5.9 (H) 06/20/2015   No results found for: [id number] Lab Results  Component Value Date   TSH 5.055 (H) 06/20/2015  ASSESSMENT AND PLAN 82 y.o. year old male has a past medical history of Hyperlipidemia; , mild hypertension and recent TIA with hospital admission Feb 2017.  MRI of the brain was normal, MRA was normal. Carotid Doppler without significant stenosis. 2-D echo with EF 60-65% no valvular abnormalities. LDL 66 . The patient is a current patient of Dr. Leonie Man who is out of the office today . This note is sent to the work in doctor.   PLAN: Continue control of risk factors for recurrent stroke/TIA  Keep systolic blood pressure less than 130, today's reading 126/62 Lipids are followed by Dr. Joylene Draft  continue Zocor Continue aspirin for  secondary stroke prevention No further stroke or TIA symptoms since 06/19/15 Continue exercise regimen If recurrent stroke symptoms occur, call 911 and proceed to the hospital Discharge from neurologic services at this time Remember the following  F weakness of the face  A weakness of the arms S trouble speaking T time to call [place], [facility], [facility], [facility] [address] [place], [place] [address] [phone]

## 2016-04-23 NOTE — Patient Instructions (Addendum)
Keep systolic blood pressure less than 130, today's reading 126/62 Lipids are followed by Dr. Joylene Draft  continue Zocor Continue aspirin for  secondary stroke prevention No further stroke or TIA symptoms since 06/19/15 Continue exercise regimen If recurrent stroke symptoms occur, call 911 and proceed to the hospital Discharge from neurologic services at this time Remember the following  F weakness of the face  A weakness of the arms S trouble speaking T time to call 911

## 2016-04-25 DIAGNOSIS — R972 Elevated prostate specific antigen [PSA]: Secondary | ICD-10-CM | POA: Diagnosis not present

## 2016-04-25 DIAGNOSIS — R310 Gross hematuria: Secondary | ICD-10-CM | POA: Diagnosis not present

## 2016-04-25 DIAGNOSIS — N210 Calculus in bladder: Secondary | ICD-10-CM | POA: Diagnosis not present

## 2016-04-25 DIAGNOSIS — N400 Enlarged prostate without lower urinary tract symptoms: Secondary | ICD-10-CM | POA: Diagnosis not present

## 2016-05-01 DIAGNOSIS — H401133 Primary open-angle glaucoma, bilateral, severe stage: Secondary | ICD-10-CM | POA: Diagnosis not present

## 2016-05-09 DIAGNOSIS — H401133 Primary open-angle glaucoma, bilateral, severe stage: Secondary | ICD-10-CM | POA: Diagnosis not present

## 2016-06-04 DIAGNOSIS — E039 Hypothyroidism, unspecified: Secondary | ICD-10-CM | POA: Diagnosis not present

## 2016-06-04 DIAGNOSIS — Z9849 Cataract extraction status, unspecified eye: Secondary | ICD-10-CM | POA: Diagnosis not present

## 2016-06-04 DIAGNOSIS — H409 Unspecified glaucoma: Secondary | ICD-10-CM | POA: Diagnosis not present

## 2016-06-04 DIAGNOSIS — M159 Polyosteoarthritis, unspecified: Secondary | ICD-10-CM | POA: Diagnosis not present

## 2016-06-04 DIAGNOSIS — K5190 Ulcerative colitis, unspecified, without complications: Secondary | ICD-10-CM | POA: Diagnosis not present

## 2016-06-04 DIAGNOSIS — L570 Actinic keratosis: Secondary | ICD-10-CM | POA: Diagnosis not present

## 2016-06-04 DIAGNOSIS — Z961 Presence of intraocular lens: Secondary | ICD-10-CM | POA: Diagnosis not present

## 2016-06-04 DIAGNOSIS — H401133 Primary open-angle glaucoma, bilateral, severe stage: Secondary | ICD-10-CM | POA: Diagnosis not present

## 2016-06-04 DIAGNOSIS — E7800 Pure hypercholesterolemia, unspecified: Secondary | ICD-10-CM | POA: Diagnosis not present

## 2016-06-04 DIAGNOSIS — M545 Low back pain: Secondary | ICD-10-CM | POA: Diagnosis not present

## 2016-06-04 DIAGNOSIS — N529 Male erectile dysfunction, unspecified: Secondary | ICD-10-CM | POA: Diagnosis not present

## 2016-06-04 DIAGNOSIS — Z0000 Encounter for general adult medical examination without abnormal findings: Secondary | ICD-10-CM | POA: Diagnosis not present

## 2016-06-04 DIAGNOSIS — Z6825 Body mass index (BMI) 25.0-25.9, adult: Secondary | ICD-10-CM | POA: Diagnosis not present

## 2016-06-05 DIAGNOSIS — R69 Illness, unspecified: Secondary | ICD-10-CM | POA: Diagnosis not present

## 2016-06-06 DIAGNOSIS — K635 Polyp of colon: Secondary | ICD-10-CM | POA: Diagnosis not present

## 2016-06-06 DIAGNOSIS — K5190 Ulcerative colitis, unspecified, without complications: Secondary | ICD-10-CM | POA: Diagnosis not present

## 2016-06-06 DIAGNOSIS — Z86010 Personal history of colonic polyps: Secondary | ICD-10-CM | POA: Diagnosis not present

## 2016-06-06 DIAGNOSIS — K640 First degree hemorrhoids: Secondary | ICD-10-CM | POA: Diagnosis not present

## 2016-06-11 DIAGNOSIS — K635 Polyp of colon: Secondary | ICD-10-CM | POA: Diagnosis not present

## 2016-07-09 DIAGNOSIS — L570 Actinic keratosis: Secondary | ICD-10-CM | POA: Diagnosis not present

## 2016-07-09 DIAGNOSIS — D692 Other nonthrombocytopenic purpura: Secondary | ICD-10-CM | POA: Diagnosis not present

## 2016-07-09 DIAGNOSIS — L821 Other seborrheic keratosis: Secondary | ICD-10-CM | POA: Diagnosis not present

## 2016-07-09 DIAGNOSIS — D1801 Hemangioma of skin and subcutaneous tissue: Secondary | ICD-10-CM | POA: Diagnosis not present

## 2016-07-09 DIAGNOSIS — D485 Neoplasm of uncertain behavior of skin: Secondary | ICD-10-CM | POA: Diagnosis not present

## 2016-07-09 DIAGNOSIS — Z85828 Personal history of other malignant neoplasm of skin: Secondary | ICD-10-CM | POA: Diagnosis not present

## 2016-07-09 DIAGNOSIS — C44619 Basal cell carcinoma of skin of left upper limb, including shoulder: Secondary | ICD-10-CM | POA: Diagnosis not present

## 2016-07-09 DIAGNOSIS — L820 Inflamed seborrheic keratosis: Secondary | ICD-10-CM | POA: Diagnosis not present

## 2016-09-03 DIAGNOSIS — H401133 Primary open-angle glaucoma, bilateral, severe stage: Secondary | ICD-10-CM | POA: Diagnosis not present

## 2016-09-03 DIAGNOSIS — H16223 Keratoconjunctivitis sicca, not specified as Sjogren's, bilateral: Secondary | ICD-10-CM | POA: Diagnosis not present

## 2016-09-10 DIAGNOSIS — R69 Illness, unspecified: Secondary | ICD-10-CM | POA: Diagnosis not present

## 2016-10-29 DIAGNOSIS — N210 Calculus in bladder: Secondary | ICD-10-CM | POA: Diagnosis not present

## 2016-10-29 DIAGNOSIS — R972 Elevated prostate specific antigen [PSA]: Secondary | ICD-10-CM | POA: Diagnosis not present

## 2016-11-26 DIAGNOSIS — H5210 Myopia, unspecified eye: Secondary | ICD-10-CM | POA: Diagnosis not present

## 2016-11-26 DIAGNOSIS — H52229 Regular astigmatism, unspecified eye: Secondary | ICD-10-CM | POA: Diagnosis not present

## 2016-11-26 DIAGNOSIS — H401131 Primary open-angle glaucoma, bilateral, mild stage: Secondary | ICD-10-CM | POA: Diagnosis not present

## 2016-11-26 DIAGNOSIS — H524 Presbyopia: Secondary | ICD-10-CM | POA: Diagnosis not present

## 2016-11-26 DIAGNOSIS — H5200 Hypermetropia, unspecified eye: Secondary | ICD-10-CM | POA: Diagnosis not present

## 2017-01-07 DIAGNOSIS — L570 Actinic keratosis: Secondary | ICD-10-CM | POA: Diagnosis not present

## 2017-01-07 DIAGNOSIS — Z85828 Personal history of other malignant neoplasm of skin: Secondary | ICD-10-CM | POA: Diagnosis not present

## 2017-01-07 DIAGNOSIS — L821 Other seborrheic keratosis: Secondary | ICD-10-CM | POA: Diagnosis not present

## 2017-01-07 DIAGNOSIS — L812 Freckles: Secondary | ICD-10-CM | POA: Diagnosis not present

## 2017-01-07 DIAGNOSIS — D0461 Carcinoma in situ of skin of right upper limb, including shoulder: Secondary | ICD-10-CM | POA: Diagnosis not present

## 2017-01-07 DIAGNOSIS — D485 Neoplasm of uncertain behavior of skin: Secondary | ICD-10-CM | POA: Diagnosis not present

## 2017-01-07 DIAGNOSIS — D1801 Hemangioma of skin and subcutaneous tissue: Secondary | ICD-10-CM | POA: Diagnosis not present

## 2017-01-14 DIAGNOSIS — R69 Illness, unspecified: Secondary | ICD-10-CM | POA: Diagnosis not present

## 2017-01-16 DIAGNOSIS — R69 Illness, unspecified: Secondary | ICD-10-CM | POA: Diagnosis not present

## 2017-02-22 DIAGNOSIS — Z23 Encounter for immunization: Secondary | ICD-10-CM | POA: Diagnosis not present

## 2017-03-04 DIAGNOSIS — H401133 Primary open-angle glaucoma, bilateral, severe stage: Secondary | ICD-10-CM | POA: Diagnosis not present

## 2017-03-04 DIAGNOSIS — H47233 Glaucomatous optic atrophy, bilateral: Secondary | ICD-10-CM | POA: Diagnosis not present

## 2017-03-04 DIAGNOSIS — H35372 Puckering of macula, left eye: Secondary | ICD-10-CM | POA: Diagnosis not present

## 2017-03-16 IMAGING — MR MR MRA HEAD W/O CM
9 of 11 series · 32 of 48 positions shown · non-contrast
Comparison: CT head June 2015 at 3783 hours

CLINICAL DATA: 25 minute episode of speech difficulties beginning
at [DATE] p.m. today while coming out of the restaurant. The no at
baseline. No neuro deficits. History of hyperlipidemia.

EXAM:
MRI HEAD WITHOUT CONTRAST
MRA HEAD WITHOUT CONTRAST
TECHNIQUE: Multiplanar, multiecho pulse sequences of the brain and surrounding
structures were obtained without intravenous contrast. Angiographic
images of the head were obtained using MRA technique without
contrast.

[Series 3: T1 · sagittal · 5.0mm · 0.47mm/px · 1 of 23 slices shown]
[im 1/23]
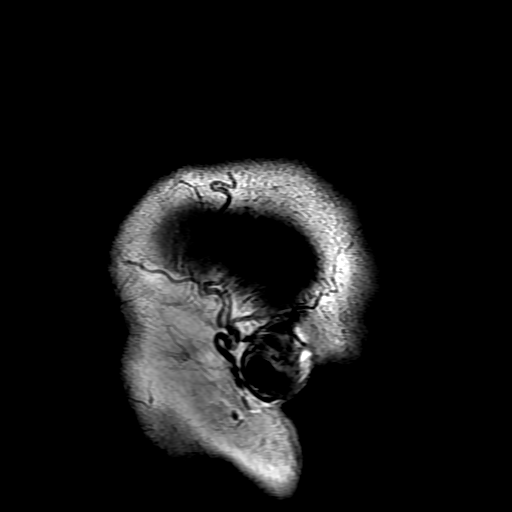

[Series 4: DWI · axial · 3.0mm · 1.09mm/px · z∈[-66,+84]mm · 7 of 102 slices shown (1 of 4)]
[im 1/102]
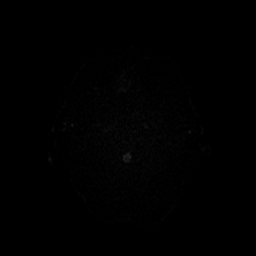
[im 17/102]
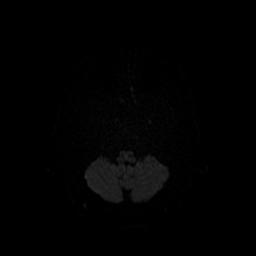
[im 34/102]
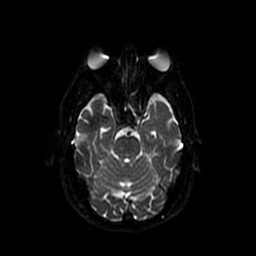
[im 51/102]
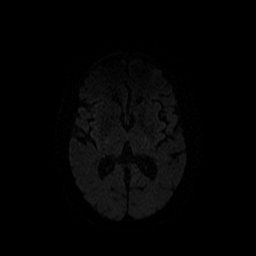
[im 68/102]
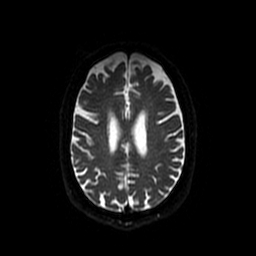
[im 85/102]
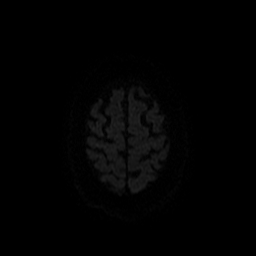
[im 102/102]
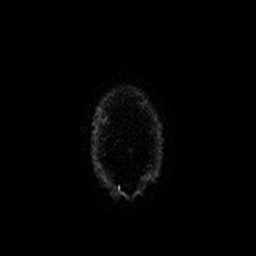

[Series 5: T2 · axial · 5.0mm · 0.43mm/px · z∈[-64,+85]mm · 2 of 26 slices shown (1 of 2)]
[im 1/26]
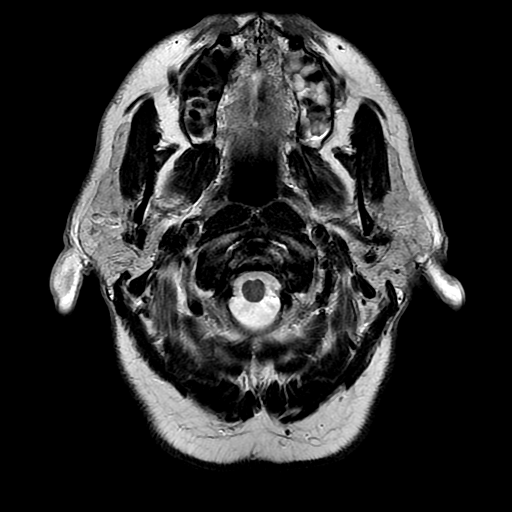
[im 26/26]
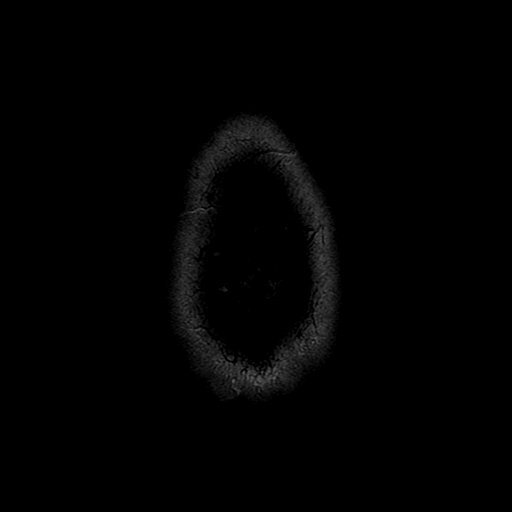

[Series 6: FLAIR · axial · 5.0mm · 0.43mm/px · z∈[-64,+85]mm · 2 of 26 slices shown]
[im 1/26]
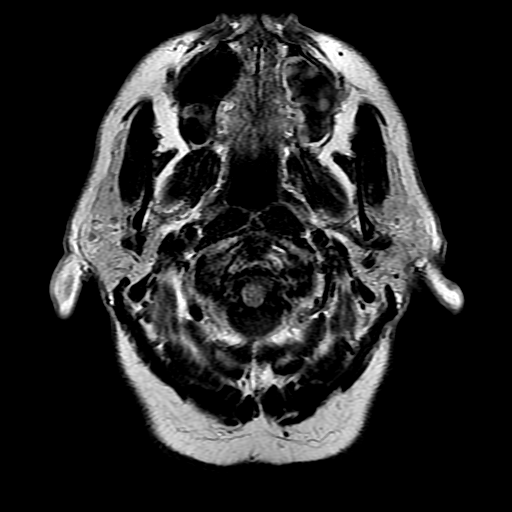
[im 26/26]
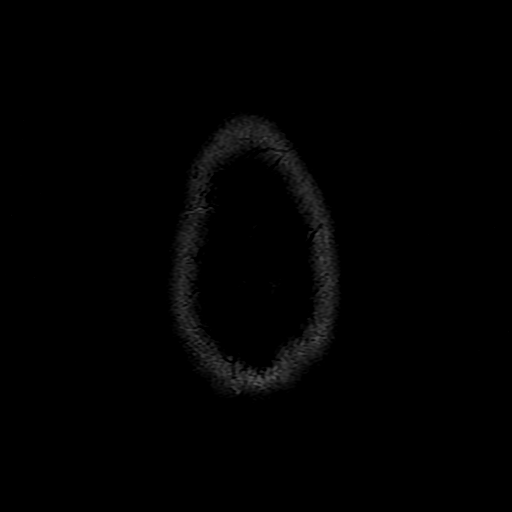

[Series 7: (id) mt fs · axial · 1.4mm · 0.43mm/px · z∈[-47,-5]mm · 4 of 136 slices shown]
[im 1/136]
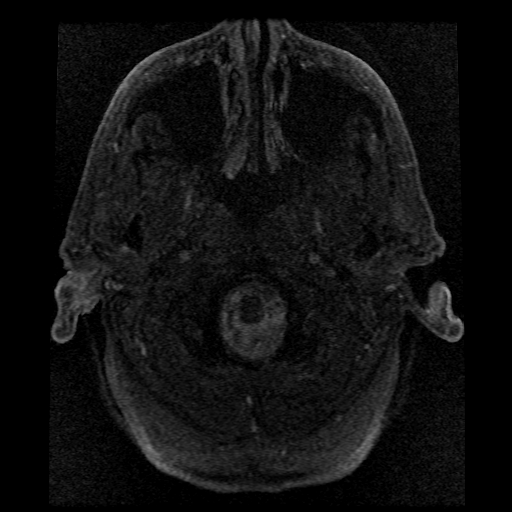
[im 16/136]
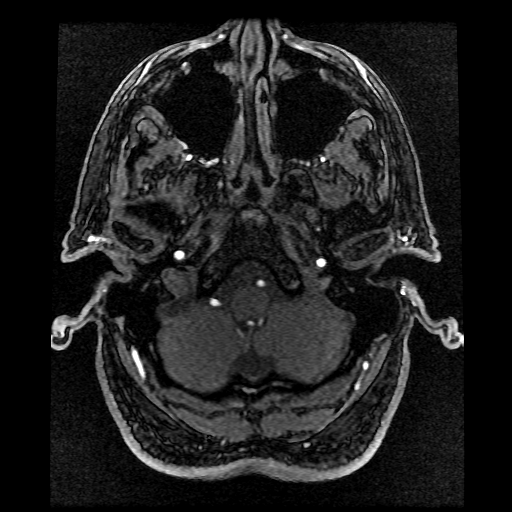
[im 46/136]
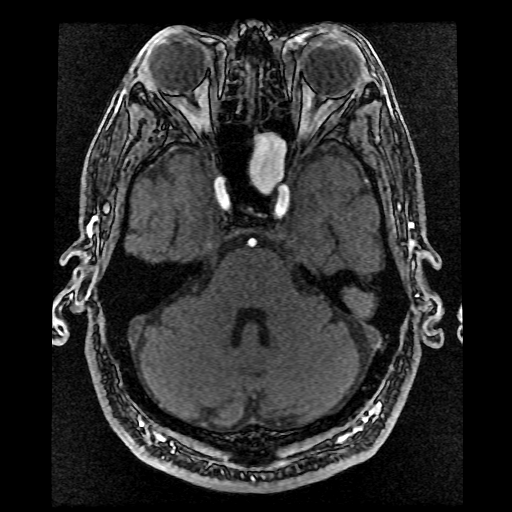
[im 61/136]
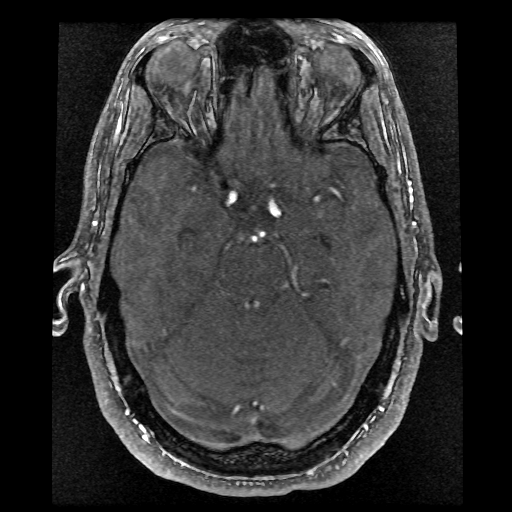

[Series 8: DWI · coronal · 5.0mm · 1.09mm/px · 6 of 76 slices shown (2 of 4)]
[im 1/76]
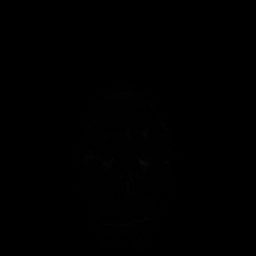
[im 16/76]
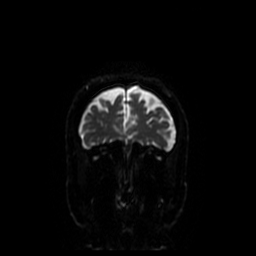
[im 31/76]
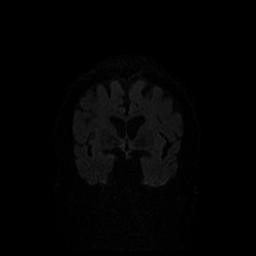
[im 46/76]
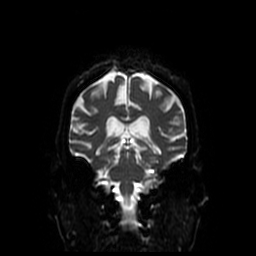
[im 61/76]
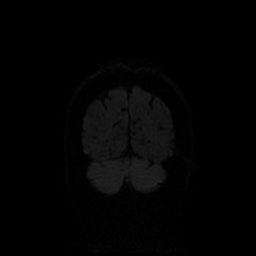
[im 76/76]
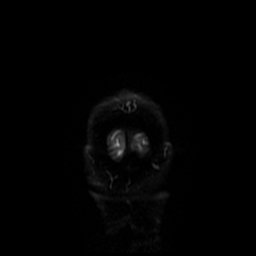

[Series 11: T2 · coronal · 5.0mm · 0.55mm/px · 3 of 38 slices shown (2 of 2)]
[im 1/38]
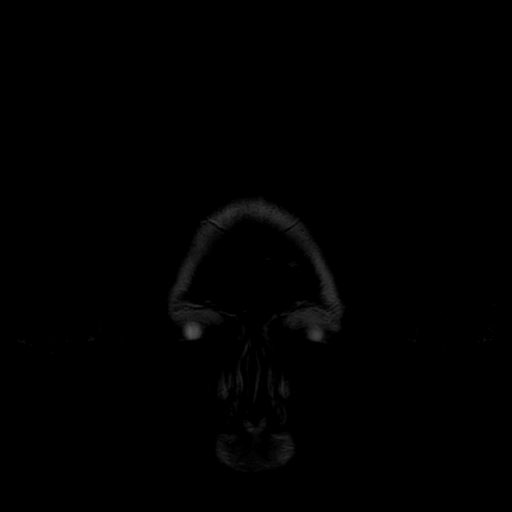
[im 19/38]
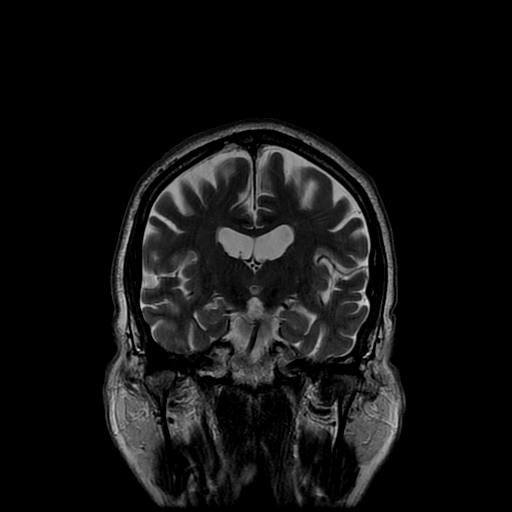
[im 38/38]
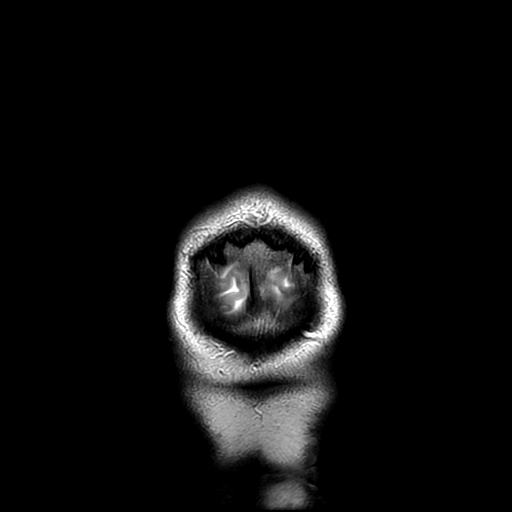

[Series 400: DWI · axial · 3.0mm · 1.09mm/px · z∈[-66,+84]mm · 4 of 51 slices shown (3 of 4)]
[im 1/51]
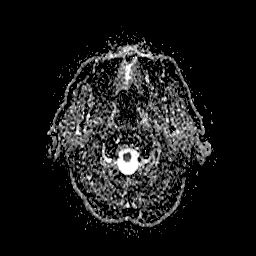
[im 17/51]
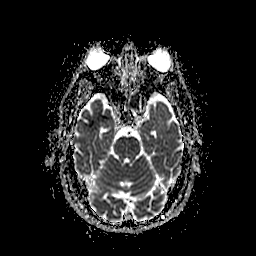
[im 34/51]
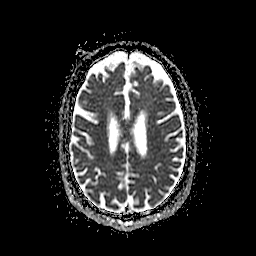
[im 51/51]
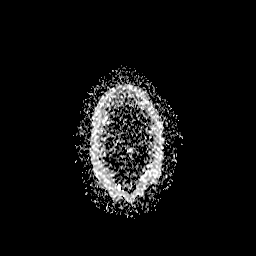

[Series 800: DWI · coronal · 5.0mm · 1.09mm/px · 3 of 38 slices shown (4 of 4)]
[im 1/38]
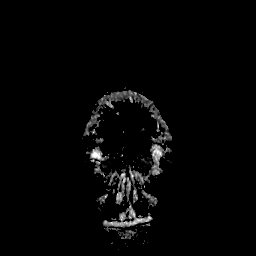
[im 19/38]
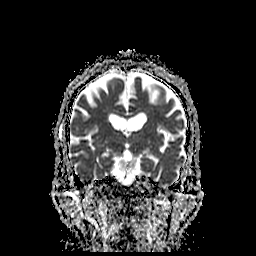
[im 38/38]
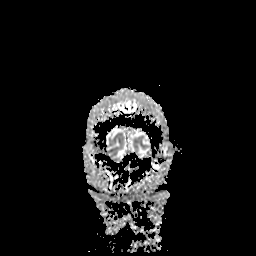

[32 of 48 positions shown; findings below may reference images not displayed]

FINDINGS: MRI HEAD FINDINGS

The ventricles and sulci are normal for patient's age. No abnormal
parenchymal signal, mass lesions, mass effect. No reduced diffusion
to suggest acute ischemia. Minimal white matter changes most
compatible chronic small vessel ischemic disease. No susceptibility
artifact to suggest hemorrhage. No abnormal extra-axial fluid
collections. No extra-axial masses though, contrast enhanced
sequences would be more sensitive. Normal major intracranial
vascular flow voids seen at the skull base.

Status post bilateral ocular lens implants. No abnormal sellar
expansion. No suspicious calvarial bone marrow signal.
Craniocervical junction maintained. Inspissated LEFT sphenoid
sinusitis versus mucosal retention cyst. Mild paranasal sinus
mucosal thickening. Mastoid air cells are well aerated.

MRA HEAD FINDINGS

Anterior circulation: Normal flow related enhancement of the
included cervical, petrous, cavernous and supraclinoid internal
carotid arteries. Patent anterior communicating artery. Normal flow
related enhancement of the anterior and middle cerebral arteries,
including distal segments.

No large vessel occlusion, high-grade stenosis, abnormal luminal
irregularity, aneurysm.

Posterior circulation: RIGHT vertebral artery is dominant. Basilar
artery is patent, with normal flow related enhancement of the main
branch vessels. Normal flow related enhancement of the posterior
cerebral arteries.

No large vessel occlusion, high-grade stenosis, abnormal luminal
irregularity, aneurysm.
IMPRESSION: Normal MRI brain without contrast for age.

Normal MRA head.

## 2017-04-01 DIAGNOSIS — Z125 Encounter for screening for malignant neoplasm of prostate: Secondary | ICD-10-CM | POA: Diagnosis not present

## 2017-04-01 DIAGNOSIS — E038 Other specified hypothyroidism: Secondary | ICD-10-CM | POA: Diagnosis not present

## 2017-04-01 DIAGNOSIS — R82998 Other abnormal findings in urine: Secondary | ICD-10-CM | POA: Diagnosis not present

## 2017-04-01 DIAGNOSIS — E7849 Other hyperlipidemia: Secondary | ICD-10-CM | POA: Diagnosis not present

## 2017-04-01 DIAGNOSIS — R7301 Impaired fasting glucose: Secondary | ICD-10-CM | POA: Diagnosis not present

## 2017-04-09 DIAGNOSIS — Q600 Renal agenesis, unilateral: Secondary | ICD-10-CM | POA: Diagnosis not present

## 2017-04-09 DIAGNOSIS — R001 Bradycardia, unspecified: Secondary | ICD-10-CM | POA: Diagnosis not present

## 2017-04-09 DIAGNOSIS — G458 Other transient cerebral ischemic attacks and related syndromes: Secondary | ICD-10-CM | POA: Diagnosis not present

## 2017-04-09 DIAGNOSIS — N183 Chronic kidney disease, stage 3 (moderate): Secondary | ICD-10-CM | POA: Diagnosis not present

## 2017-04-09 DIAGNOSIS — H8113 Benign paroxysmal vertigo, bilateral: Secondary | ICD-10-CM | POA: Diagnosis not present

## 2017-04-09 DIAGNOSIS — C649 Malignant neoplasm of unspecified kidney, except renal pelvis: Secondary | ICD-10-CM | POA: Diagnosis not present

## 2017-04-09 DIAGNOSIS — N401 Enlarged prostate with lower urinary tract symptoms: Secondary | ICD-10-CM | POA: Diagnosis not present

## 2017-04-09 DIAGNOSIS — I7389 Other specified peripheral vascular diseases: Secondary | ICD-10-CM | POA: Diagnosis not present

## 2017-04-09 DIAGNOSIS — E7849 Other hyperlipidemia: Secondary | ICD-10-CM | POA: Diagnosis not present

## 2017-04-09 DIAGNOSIS — Z0000 Encounter for general adult medical examination without abnormal findings: Secondary | ICD-10-CM | POA: Diagnosis not present

## 2017-04-29 DIAGNOSIS — N401 Enlarged prostate with lower urinary tract symptoms: Secondary | ICD-10-CM | POA: Diagnosis not present

## 2017-04-29 DIAGNOSIS — N210 Calculus in bladder: Secondary | ICD-10-CM | POA: Diagnosis not present

## 2017-04-29 DIAGNOSIS — R3915 Urgency of urination: Secondary | ICD-10-CM | POA: Diagnosis not present

## 2017-04-29 DIAGNOSIS — R972 Elevated prostate specific antigen [PSA]: Secondary | ICD-10-CM | POA: Diagnosis not present

## 2017-05-09 DIAGNOSIS — N210 Calculus in bladder: Secondary | ICD-10-CM | POA: Diagnosis not present

## 2017-05-27 DIAGNOSIS — Z85528 Personal history of other malignant neoplasm of kidney: Secondary | ICD-10-CM | POA: Diagnosis not present

## 2017-05-27 DIAGNOSIS — K5150 Left sided colitis without complications: Secondary | ICD-10-CM | POA: Diagnosis not present

## 2017-05-27 DIAGNOSIS — Z86010 Personal history of colonic polyps: Secondary | ICD-10-CM | POA: Diagnosis not present

## 2017-05-29 DIAGNOSIS — R69 Illness, unspecified: Secondary | ICD-10-CM | POA: Diagnosis not present

## 2017-06-10 DIAGNOSIS — H47233 Glaucomatous optic atrophy, bilateral: Secondary | ICD-10-CM | POA: Diagnosis not present

## 2017-06-10 DIAGNOSIS — H401133 Primary open-angle glaucoma, bilateral, severe stage: Secondary | ICD-10-CM | POA: Diagnosis not present

## 2017-06-27 DIAGNOSIS — D1801 Hemangioma of skin and subcutaneous tissue: Secondary | ICD-10-CM | POA: Diagnosis not present

## 2017-06-27 DIAGNOSIS — L570 Actinic keratosis: Secondary | ICD-10-CM | POA: Diagnosis not present

## 2017-06-27 DIAGNOSIS — C4442 Squamous cell carcinoma of skin of scalp and neck: Secondary | ICD-10-CM | POA: Diagnosis not present

## 2017-06-27 DIAGNOSIS — L821 Other seborrheic keratosis: Secondary | ICD-10-CM | POA: Diagnosis not present

## 2017-06-27 DIAGNOSIS — D485 Neoplasm of uncertain behavior of skin: Secondary | ICD-10-CM | POA: Diagnosis not present

## 2017-06-27 DIAGNOSIS — L812 Freckles: Secondary | ICD-10-CM | POA: Diagnosis not present

## 2017-06-27 DIAGNOSIS — Z85828 Personal history of other malignant neoplasm of skin: Secondary | ICD-10-CM | POA: Diagnosis not present

## 2017-08-11 DIAGNOSIS — K5190 Ulcerative colitis, unspecified, without complications: Secondary | ICD-10-CM | POA: Diagnosis not present

## 2017-08-11 DIAGNOSIS — H409 Unspecified glaucoma: Secondary | ICD-10-CM | POA: Diagnosis not present

## 2017-08-11 DIAGNOSIS — B353 Tinea pedis: Secondary | ICD-10-CM | POA: Diagnosis not present

## 2017-08-11 DIAGNOSIS — Z7982 Long term (current) use of aspirin: Secondary | ICD-10-CM | POA: Diagnosis not present

## 2017-08-11 DIAGNOSIS — E039 Hypothyroidism, unspecified: Secondary | ICD-10-CM | POA: Diagnosis not present

## 2017-08-11 DIAGNOSIS — L719 Rosacea, unspecified: Secondary | ICD-10-CM | POA: Diagnosis not present

## 2017-08-11 DIAGNOSIS — R030 Elevated blood-pressure reading, without diagnosis of hypertension: Secondary | ICD-10-CM | POA: Diagnosis not present

## 2017-08-11 DIAGNOSIS — N529 Male erectile dysfunction, unspecified: Secondary | ICD-10-CM | POA: Diagnosis not present

## 2017-08-11 DIAGNOSIS — R32 Unspecified urinary incontinence: Secondary | ICD-10-CM | POA: Diagnosis not present

## 2017-08-11 DIAGNOSIS — E785 Hyperlipidemia, unspecified: Secondary | ICD-10-CM | POA: Diagnosis not present

## 2017-10-07 DIAGNOSIS — H401133 Primary open-angle glaucoma, bilateral, severe stage: Secondary | ICD-10-CM | POA: Diagnosis not present

## 2017-10-07 DIAGNOSIS — H47233 Glaucomatous optic atrophy, bilateral: Secondary | ICD-10-CM | POA: Diagnosis not present

## 2017-10-08 DIAGNOSIS — R69 Illness, unspecified: Secondary | ICD-10-CM | POA: Diagnosis not present

## 2017-10-28 DIAGNOSIS — H47233 Glaucomatous optic atrophy, bilateral: Secondary | ICD-10-CM | POA: Diagnosis not present

## 2017-10-28 DIAGNOSIS — H401133 Primary open-angle glaucoma, bilateral, severe stage: Secondary | ICD-10-CM | POA: Diagnosis not present

## 2017-10-29 DIAGNOSIS — R972 Elevated prostate specific antigen [PSA]: Secondary | ICD-10-CM | POA: Diagnosis not present

## 2017-11-05 DIAGNOSIS — R972 Elevated prostate specific antigen [PSA]: Secondary | ICD-10-CM | POA: Diagnosis not present

## 2017-11-05 DIAGNOSIS — N401 Enlarged prostate with lower urinary tract symptoms: Secondary | ICD-10-CM | POA: Diagnosis not present

## 2017-11-05 DIAGNOSIS — R3915 Urgency of urination: Secondary | ICD-10-CM | POA: Diagnosis not present

## 2017-12-16 DIAGNOSIS — H5212 Myopia, left eye: Secondary | ICD-10-CM | POA: Diagnosis not present

## 2017-12-16 DIAGNOSIS — H5201 Hypermetropia, right eye: Secondary | ICD-10-CM | POA: Diagnosis not present

## 2017-12-16 DIAGNOSIS — H524 Presbyopia: Secondary | ICD-10-CM | POA: Diagnosis not present

## 2017-12-16 DIAGNOSIS — H52223 Regular astigmatism, bilateral: Secondary | ICD-10-CM | POA: Diagnosis not present

## 2017-12-16 DIAGNOSIS — Z0100 Encounter for examination of eyes and vision without abnormal findings: Secondary | ICD-10-CM | POA: Diagnosis not present

## 2017-12-16 DIAGNOSIS — H401132 Primary open-angle glaucoma, bilateral, moderate stage: Secondary | ICD-10-CM | POA: Diagnosis not present

## 2017-12-18 DIAGNOSIS — H401133 Primary open-angle glaucoma, bilateral, severe stage: Secondary | ICD-10-CM | POA: Diagnosis not present

## 2017-12-18 DIAGNOSIS — H47233 Glaucomatous optic atrophy, bilateral: Secondary | ICD-10-CM | POA: Diagnosis not present

## 2017-12-30 DIAGNOSIS — C44729 Squamous cell carcinoma of skin of left lower limb, including hip: Secondary | ICD-10-CM | POA: Diagnosis not present

## 2017-12-30 DIAGNOSIS — L821 Other seborrheic keratosis: Secondary | ICD-10-CM | POA: Diagnosis not present

## 2017-12-30 DIAGNOSIS — L570 Actinic keratosis: Secondary | ICD-10-CM | POA: Diagnosis not present

## 2017-12-30 DIAGNOSIS — Z85828 Personal history of other malignant neoplasm of skin: Secondary | ICD-10-CM | POA: Diagnosis not present

## 2017-12-30 DIAGNOSIS — D1801 Hemangioma of skin and subcutaneous tissue: Secondary | ICD-10-CM | POA: Diagnosis not present

## 2017-12-30 DIAGNOSIS — D485 Neoplasm of uncertain behavior of skin: Secondary | ICD-10-CM | POA: Diagnosis not present

## 2017-12-30 DIAGNOSIS — D692 Other nonthrombocytopenic purpura: Secondary | ICD-10-CM | POA: Diagnosis not present

## 2018-01-28 DIAGNOSIS — R69 Illness, unspecified: Secondary | ICD-10-CM | POA: Diagnosis not present

## 2018-02-17 DIAGNOSIS — R69 Illness, unspecified: Secondary | ICD-10-CM | POA: Diagnosis not present

## 2018-03-17 DIAGNOSIS — H401133 Primary open-angle glaucoma, bilateral, severe stage: Secondary | ICD-10-CM | POA: Diagnosis not present

## 2018-03-17 DIAGNOSIS — H47233 Glaucomatous optic atrophy, bilateral: Secondary | ICD-10-CM | POA: Diagnosis not present

## 2018-04-16 DIAGNOSIS — E038 Other specified hypothyroidism: Secondary | ICD-10-CM | POA: Diagnosis not present

## 2018-04-16 DIAGNOSIS — R001 Bradycardia, unspecified: Secondary | ICD-10-CM | POA: Diagnosis not present

## 2018-04-16 DIAGNOSIS — I499 Cardiac arrhythmia, unspecified: Secondary | ICD-10-CM | POA: Diagnosis not present

## 2018-04-16 DIAGNOSIS — R011 Cardiac murmur, unspecified: Secondary | ICD-10-CM | POA: Diagnosis not present

## 2018-04-16 DIAGNOSIS — Z6824 Body mass index (BMI) 24.0-24.9, adult: Secondary | ICD-10-CM | POA: Diagnosis not present

## 2018-04-16 DIAGNOSIS — D696 Thrombocytopenia, unspecified: Secondary | ICD-10-CM | POA: Diagnosis not present

## 2018-04-17 ENCOUNTER — Encounter: Payer: Self-pay | Admitting: Cardiovascular Disease

## 2018-04-17 ENCOUNTER — Ambulatory Visit: Payer: Medicare HMO | Admitting: Cardiovascular Disease

## 2018-04-17 VITALS — BP 136/68 | HR 50 | Ht 74.0 in | Wt 185.1 lb

## 2018-04-17 DIAGNOSIS — I441 Atrioventricular block, second degree: Secondary | ICD-10-CM

## 2018-04-17 NOTE — Progress Notes (Signed)
Chief Complaint  Patient presents with  . New Patient (Initial Visit)    bradycardia   History of Present Illness:84 yo male with history of ulcerative colitis, renal cell carcinoma s/p left nephrectomy, chronic kidney disease, TIA in 2017, GERD, BPH, hyperthyroidism and hyperlipidemia who is here today as a new patient for the evaluation of bradycardia.  Echo in February 2017 with moderate LVH, SHFW=26-37%, grade 1 diastolic dysfunction and no significant valve disease. He was seen in primary care yesterday by Reginold Agent, NP due to bradycardia. He was at the [facility] on 04/15/18 and his heart rate was 37 bpm. He had no complaints. EKG yesterday in the primary care office with sinus rhythm with second degree AV block, type2. He has had no dizziness, near syncope, syncope, weakness, chest pain, dyspnea, LE edema. Electrolytes normal yesterday. TSH pending.   Primary Care Physician: [name], MD  Past Medical History:  Diagnosis Date  . Arthritis   . BPH (benign prostatic hypertrophy)   . Complication of anesthesia    POST URINARY RETENTION  . ED (erectile dysfunction) of organic origin   . Glaucoma    bilateral --  right uses rx drops and left eye trabeculectomy 2011  . History of gastroesophageal reflux (GERD)   . History of renal cell cancer    S/P  LEFT NEPHRECTOMY  2003--  no recurrence  . History of ulcerative colitis    in remission  . Hyperlipidemia   . Hypothyroidism   . Keratosis, actinic   . 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 Rosacea     Past Surgical History:  Procedure Laterality Date  . CATARACT EXTRACTION W/ INTRAOCULAR LENS  IMPLANT, BILATERAL    . COLONOSCOPY  FEB 2015  . GREEN LIGHT LASER TURP (TRANSURETHRAL RESECTION OF PROSTATE N/A 08/10/2013   Procedure: GREEN LIGHT LASER TURP (TRANSURETHRAL RESECTION OF PROSTATE;  Surgeon: [name], MD;  Location: [facility];  Service:  Urology;  Laterality: N/A;  . [place]  . NEPHRECTOMY Left 2003  . ORIF CLAVICULAR FRACTURE  12/03/2011   Procedure: OPEN REDUCTION INTERNAL FIXATION (ORIF) CLAVICULAR FRACTURE;  Surgeon: [name], MD;  Location: [facility];  Service: Orthopedics;  Laterality: Right;  . SHOULDER ARTHROSCOPY Left LEFT  2012  . TONSILLECTOMY  as child  . TRABECULECTOMY  2011   left eye (glaucoma)    Current Outpatient Medications  Medication Sig Dispense Refill  . aspirin 81 MG tablet Take 1 tablet (81 mg total) by mouth daily. 30 tablet 0  . azaTHIOprine (IMURAN) 50 MG tablet Take 75 mg by mouth every morning. Takes 1  1/2  TABS    . calcium carbonate (OS-CAL) 600 MG TABS Take 600 mg by mouth daily with breakfast.     . Cholecalciferol (VITAMIN D3) 1000 UNITS CAPS Take 2 capsules by mouth daily.     . Coenzyme Q10 (COQ10) 200 MG CAPS Take 1 capsule by mouth daily.    . fish oil-omega-3 fatty acids 1000 MG capsule Take 1 g by mouth daily.     . fluoruracil (CARAC) 0.5 % cream Apply 1 application topically 2 (two) times daily.     . Ginkgo Biloba 40 MG TABS Take 120 mg by mouth daily.     [name] levothyroxine (SYNTHROID, LEVOTHROID) 50 MCG tablet Take 50-100 mcg by mouth daily before breakfast. Take 62mg everyday except Wednesday and Sunday  take 146mg.    . mesalamine (APRISO) 0.375 G 24 hr capsule Take 2,250 mg by mouth daily. TAKES 6 TABS DAILY--  TOTAL 2250MG    . mesalamine (CANASA) 1000 MG suppository Place 1,000 mg rectally at bedtime.    . metroNIDAZOLE (METROGEL) 0.75 % gel Apply 1 application topically 2 (two) times daily as needed (Rosacea).     . Multiple Vitamins-Minerals (EQ COMPLETE MULTIVIT ADULT 50+ PO) Take 1 tablet by mouth daily.    . Netarsudil-Latanoprost (ROCKLATAN) 0.02-0.005 % SOLN Apply 1 drop to eye at bedtime.    . niacin (SLO-NIACIN) 500 MG tablet Take 500 mg by mouth at bedtime.    . sildenafil (VIAGRA) 100 MG tablet Take 50 mg by mouth  daily as needed for erectile dysfunction.    . simvastatin (ZOCOR) 20 MG tablet Take 10 mg by mouth every other day. TAKES QHS    . urea (CARMOL) 20 % cream Apply topically as needed.     No current facility-administered medications for this visit.     Allergies  Allergen Reactions  . Lipitor [Atorvastatin] Other (See Comments)    "severe muscle aches"  . Sporanox [Itraconazole] Rash  Retire  Social History   Socioeconomic History  . Marital status: Married    Spouse name: Not on file  . Number of children: 4  . Years of education: Not on file  . Highest education level: Not on file  Occupational History  . Occupation: RProgrammer, systems . Financial resource strain: Not on file  . Food insecurity:    Worry: Not on file    Inability: Not on file  . Transportation needs:    Medical: Not on file    Non-medical: Not on file  Tobacco Use  . Smoking status: Former Smoker    Years: 6.00    Types: Cigarettes    Last attempt to quit: 12/01/1953    Years since quitting: 64.4  . Smokeless tobacco: Never Used  Substance and Sexual Activity  . Alcohol use: Yes    Alcohol/week: 7.0 standard drinks    Types: 7 Glasses of wine per week    Comment: glass of wine several days per week  . Drug use: No  . Sexual activity: Not on file  Lifestyle  . Physical activity:    Days per week: Not on file    Minutes per session: Not on file  . Stress: Not on file  Relationships  . Social connections:    Talks on phone: Not on file    Gets together: Not on file    Attends religious service: Not on file    Active member of club or organization: Not on file    Attends meetings of clubs or organizations: Not on file    Relationship status: Not on file  . Intimate partner violence:    Fear of current or ex partner: Not on file    Emotionally abused: Not on file    Physically abused: Not on file    Forced sexual activity: Not on file  Other Topics Concern  . Not on file    Social History Narrative  . Not on file    Family History  Problem Relation Age of Onset  . Breast cancer Mother   . Emphysema Father   . Alzheimer's disease Brother     Review of Systems:  As stated in the HPI and otherwise negative.   BP 136/68   Pulse (!) 50  Ht 6' 2"  (1.88 m)   Wt 185 lb 1.9 oz (84 kg)   SpO2 98%   BMI 23.77 kg/m   Physical Examination: General: Well developed, well nourished, NAD  HEENT: OP clear, mucus membranes moist  SKIN: warm, dry. No rashes. Neuro: No focal deficits  Musculoskeletal: Muscle strength 5/5 all ext  Psychiatric: Mood and affect normal  Neck: No JVD, no carotid bruits, no thyromegaly, no lymphadenopathy.  Lungs:Clear bilaterally, no wheezes, rhonci, crackles Cardiovascular: Irregular, brady. No murmurs, gallops or rubs. Abdomen:Soft. Bowel sounds present. Non-tender.  Extremities: No lower extremity edema. Pulses are 2 + in the bilateral DP/PT.  EKG:  EKG is ordered today. The ekg ordered today demonstrates Sinus rhythm. LBBB. Type 2 second degree AV block.   Recent Labs: No results found for requested labs within last 8760 hours.   Lipid Panel    Component Value Date/Time   CHOL 126 06/20/2015 0341   TRIG 152 (H) 06/20/2015 0341   HDL 30 (L) 06/20/2015 0341   CHOLHDL 4.2 06/20/2015 0341   VLDL 30 06/20/2015 0341   LDLCALC 66 06/20/2015 0341     Wt Readings from Last 3 Encounters:  04/17/18 185 lb 1.9 oz (84 kg)  04/23/16 194 lb 12.8 oz (88.4 kg)  10/24/15 188 lb 3.2 oz (85.4 kg)     Other studies Reviewed: Additional studies/ records that were reviewed today include:  Review of the above records demonstrates:    Assessment and Plan:   1. Type 2, second degree AV block: He has high grade AV block but no symptoms at this time. I have reviewed his EKG and strip with DR. Caryl Comes this am. He will most likely end up needing a pacemaker. Since he is asymptomatic, will place a 48 hour cardiac monitor today and arrange  an echo. I will plan f/u in our EP clinic next week to review his monitor and discuss a  Pacemaker if necessary. He is on no AV nodal blocking agents. I have reviewed indication for a pacemaker with the patient. He is aware that he should go to the ED or call 911 if he has onset of severe weakness or dizziness this weekend.   Current medicines are reviewed at length with the patient today.  The patient does not have concerns regarding medicines.  The following changes have been made:  no change  Labs/ tests ordered today include:   Orders Placed This Encounter  Procedures  . Ambulatory referral to Cardiac Electrophysiology  . HOLTER MONITOR - 48 HOUR  . EKG 12-Lead  . ECHOCARDIOGRAM COMPLETE     Disposition:   FU will be made in our EP clinic next week.    Signed, Lauree Chandler, MD 04/17/2018 9:54 AM    [facility] [address], [place], [place]  [address] Phone: [phone]; Fax: [phone]

## 2018-04-17 NOTE — Addendum Note (Signed)
Addended by: Thompson Grayer on: 04/17/2018 12:20 PM   Modules accepted: Orders

## 2018-04-17 NOTE — Patient Instructions (Signed)
Medication Instructions:  Your physician recommends that you continue on your current medications as directed. Please refer to the Current Medication list given to you today.  If you need a refill on your cardiac medications before your next appointment, please call your pharmacy.   Lab work: none If you have labs (blood work) drawn today and your tests are completely normal, you will receive your results only by: [name] MyChart Message (if you have MyChart) OR . A paper copy in the mail If you have any lab test that is abnormal or we need to change your treatment, we will call you to review the results.  Testing/Procedures: Your physician has recommended that you wear a holter monitor. Holter monitors are medical devices that record the heart's electrical activity. Doctors most often use these monitors to diagnose arrhythmias. Arrhythmias are problems with the speed or rhythm of the heartbeat. The monitor is a small, portable device. You can wear one while you do your normal daily activities. This is usually used to diagnose what is causing palpitations/syncope (passing out).  Your physician has requested that you have an echocardiogram. Echocardiography is a painless test that uses sound waves to create images of your heart. It provides your doctor with information about the size and shape of your heart and how well your heart's chambers and valves are working. This procedure takes approximately one hour. There are no restrictions for this procedure.    Follow-Up: You have been referred to Electrophysiologist in our office.  We will call you to schedule this appointment

## 2018-04-20 DIAGNOSIS — R972 Elevated prostate specific antigen [PSA]: Secondary | ICD-10-CM | POA: Diagnosis not present

## 2018-04-21 ENCOUNTER — Other Ambulatory Visit: Payer: Self-pay

## 2018-04-21 ENCOUNTER — Ambulatory Visit (HOSPITAL_COMMUNITY): Payer: Medicare HMO | Attending: Internal Medicine

## 2018-04-21 DIAGNOSIS — I5189 Other ill-defined heart diseases: Secondary | ICD-10-CM

## 2018-04-21 DIAGNOSIS — I517 Cardiomegaly: Secondary | ICD-10-CM

## 2018-04-21 DIAGNOSIS — I441 Atrioventricular block, second degree: Secondary | ICD-10-CM | POA: Insufficient documentation

## 2018-04-21 HISTORY — DX: Other ill-defined heart diseases: I51.89

## 2018-04-21 HISTORY — DX: Cardiomegaly: I51.7

## 2018-04-23 ENCOUNTER — Ambulatory Visit (INDEPENDENT_AMBULATORY_CARE_PROVIDER_SITE_OTHER): Payer: Medicare HMO

## 2018-04-23 ENCOUNTER — Other Ambulatory Visit: Payer: Self-pay | Admitting: Cardiovascular Disease

## 2018-04-23 ENCOUNTER — Telehealth: Payer: Self-pay | Admitting: Cardiovascular Disease

## 2018-04-23 DIAGNOSIS — I441 Atrioventricular block, second degree: Secondary | ICD-10-CM

## 2018-04-23 NOTE — Telephone Encounter (Signed)
I spoke with pt and reviewed echo results with him.  

## 2018-04-23 NOTE — Telephone Encounter (Signed)
New message    Pt is returning call to nurse about echo results.

## 2018-04-27 DIAGNOSIS — R972 Elevated prostate specific antigen [PSA]: Secondary | ICD-10-CM | POA: Diagnosis not present

## 2018-04-27 DIAGNOSIS — R310 Gross hematuria: Secondary | ICD-10-CM | POA: Diagnosis not present

## 2018-04-29 ENCOUNTER — Telehealth: Payer: Self-pay

## 2018-04-29 NOTE — Telephone Encounter (Signed)
Notes recorded by Frederik Schmidt, RN on 04/29/2018 at 2:10 PM EST [id number] 12/18 ------

## 2018-04-29 NOTE — Telephone Encounter (Signed)
-----   Message from Burnell Blanks, MD sent at 04/29/2018  2:02 PM EST ----- Can we let Mr. Sheley know that his heart monitor shows the same rhythm that was documented on his EKG when I saw him in the office. Dr. Caryl Comes reviewed that day of his office visit with me. Can we make sure he is having no dizziness or near syncope. If not, keep appt with Dr. Caryl Comes next week. If he has onset of dizziness, weakness or near syncope, will need to come to the ED. Thanks, Gerald Stabs

## 2018-04-29 NOTE — Telephone Encounter (Signed)
-----   Message from Burnell Blanks, MD sent at 04/29/2018  2:02 PM EST ----- Can we let Mr. Newsham know that his heart monitor shows the same rhythm that was documented on his EKG when I saw him in the office. Dr. Caryl Comes reviewed that day of his office visit with me. Can we make sure he is having no dizziness or near syncope. If not, keep appt with Dr. Caryl Comes next week. If he has onset of dizziness, weakness or near syncope, will need to come to the ED. Thanks, Gerald Stabs

## 2018-04-29 NOTE — Telephone Encounter (Signed)
Notes recorded by Frederik Schmidt, RN on 04/29/2018 at 3:12 PM EST The patient's wife has been notified of the result and verbalized understanding. She said that her husband has had no symptoms of dizziness, weakness or syncope. They will f/u at appt 12/23 with Dr Caryl Comes. All questions (if any) were answered. Frederik Schmidt, RN 04/29/2018 3:11 PM

## 2018-05-04 NOTE — H&P (View-Only) (Signed)
ELECTROPHYSIOLOGY CONSULT NOTE  Patient ID: Joseph Hanna, MRN: [id number], DOB/AGE: 12/21/1932 84 y.o. Admit date: (Not on file) Date of Consult: 05/05/2018  Primary Physician: Joseph Infante, MD Primary Cardiologist: Joseph Hanna     Joseph Hanna is a 84 y.o. male who is being seen today for the evaluation of second degree heart block at the request of Joseph Hanna.    HPI Joseph Hanna is a 84 y.o. male seen for bradycardia  Noted abrupt change in HR about 3 weeks ago 60/70s>>40s and occ 30s.  No assoc LH or exercise tolerance but has appreciated while exercising that HR will initially increase and then decrease with ongoing effort.  No hx of heart disease  Prior TIA 2017, Rx with Plavix>> ASA-- superficial bleeding \  DATE TEST EF   2/17 Echo   60-65% % LVH mod  12/19 Echo   60*-65 % LVH mod        ECG  2/17 Narrow QRS 04/16/18  Sinus w blocked PACs and LBBB 12/6/9 Sinus with blocked PACs LBBB Hx of renal cell carcinoma with L nephrectomy.      Past Medical History:  Diagnosis Date  . Arthritis   . BPH (benign prostatic hypertrophy)   . Complication of anesthesia    POST URINARY RETENTION  . ED (erectile dysfunction) of organic origin   . Glaucoma    bilateral --  right uses rx drops and left eye trabeculectomy 2011  . History of gastroesophageal reflux (GERD)   . History of renal cell cancer    S/P  LEFT NEPHRECTOMY  2003--  no recurrence  . History of ulcerative colitis    in remission  . Hyperlipidemia   . Hypothyroidism   . Keratosis, actinic   . 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 Rosacea       Surgical History:  Past Surgical History:  Procedure Laterality Date  . CATARACT EXTRACTION W/ INTRAOCULAR LENS  IMPLANT, BILATERAL    . COLONOSCOPY  FEB 2015  . GREEN LIGHT LASER TURP (TRANSURETHRAL RESECTION OF PROSTATE N/A 08/10/2013   Procedure: GREEN LIGHT LASER TURP (TRANSURETHRAL RESECTION OF PROSTATE;   Surgeon: Joseph Bonine, MD;  Location: [facility];  Service: Urology;  Laterality: N/A;  . [place]  . NEPHRECTOMY Left 2003  . ORIF CLAVICULAR FRACTURE  12/03/2011   Procedure: OPEN REDUCTION INTERNAL FIXATION (ORIF) CLAVICULAR FRACTURE;  Surgeon: Joseph Sells, MD;  Location: [place];  Service: Orthopedics;  Laterality: Right;  . SHOULDER ARTHROSCOPY Left LEFT  2012  . TONSILLECTOMY  as child  . TRABECULECTOMY  2011   left eye (glaucoma)     Home Meds: Current Meds  Medication Sig  . aspirin 81 MG tablet Take 1 tablet (81 mg total) by mouth daily.  Joseph Hanna azaTHIOprine (IMURAN) 50 MG tablet Take 75 mg by mouth every morning. Takes 1  1/2  TABS  . calcium carbonate (OS-CAL) 600 MG TABS Take 600 mg by mouth daily with breakfast.   . Cholecalciferol (VITAMIN D3) 1000 UNITS CAPS Take 2 capsules by mouth daily.   . Coenzyme Q10 (COQ10) 200 MG CAPS Take 1 capsule by mouth daily.  . fish oil-omega-3 fatty acids 1000 MG capsule Take 1 g by mouth daily.   . fluoruracil (CARAC) 0.5 % cream Apply 1 application topically 2 (two) times daily.   . Ginkgo Biloba  40 MG TABS Take 120 mg by mouth daily.   Joseph Hanna levothyroxine (SYNTHROID, LEVOTHROID) 50 MCG tablet Take 50-100 mcg by mouth daily before breakfast. Take 36mg everyday except Wednesday and Sunday take 1044m.  . mesalamine (APRISO) 0.375 G 24 hr capsule Take 2,250 mg by mouth daily. TAKES 6 TABS DAILY--  TOTAL 2250MG  . mesalamine (CANASA) 1000 MG suppository Place 1,000 mg rectally at bedtime.  . metroNIDAZOLE (METROGEL) 0.75 % gel Apply 1 application topically 2 (two) times daily as needed (Rosacea).   . Multiple Vitamins-Minerals (EQ COMPLETE MULTIVIT ADULT 50+ PO) Take 1 tablet by mouth daily.  . Netarsudil-Latanoprost (ROCKLATAN) 0.02-0.005 % SOLN Apply 1 drop to eye at bedtime.  . niacin (SLO-NIACIN) 500 MG tablet Take 500 mg by mouth at bedtime.  . sildenafil (VIAGRA) 100 MG  tablet Take 50 mg by mouth daily as needed for erectile dysfunction.  . simvastatin (ZOCOR) 20 MG tablet Take 10 mg by mouth every other day. TAKES QHS  . urea (CARMOL) 20 % cream Apply topically as needed.    Allergies:  Allergies  Allergen Reactions  . Lipitor [Atorvastatin] Other (See Comments)    "severe muscle aches"  . Sporanox [Itraconazole] Rash    Social History   Socioeconomic History  . Marital status: Married    Spouse name: Not on file  . Number of children: 4  . Years of education: Not on file  . Highest education level: Not on file  Occupational History  . Occupation: ReProgrammer, systems. Financial resource strain: Not on file  . Food insecurity:    Worry: Not on file    Inability: Not on file  . Transportation needs:    Medical: Not on file    Non-medical: Not on file  Tobacco Use  . Smoking status: Former Smoker    Years: 6.00    Types: Cigarettes    Last attempt to quit: 12/01/1953    Years since quitting: 64.4  . Smokeless tobacco: Never Used  Substance and Sexual Activity  . Alcohol use: Yes    Alcohol/week: 7.0 standard drinks    Types: 7 Glasses of wine per week    Comment: glass of wine several days per week  . Drug use: No  . Sexual activity: Not on file  Lifestyle  . Physical activity:    Days per week: Not on file    Minutes per session: Not on file  . Stress: Not on file  Relationships  . Social connections:    Talks on phone: Not on file    Gets together: Not on file    Attends religious service: Not on file    Active member of club or organization: Not on file    Attends meetings of clubs or organizations: Not on file    Relationship status: Not on file  . Intimate partner violence:    Fear of current or ex partner: Not on file    Emotionally abused: Not on file    Physically abused: Not on file    Forced sexual activity: Not on file  Other Topics Concern  . Not on file  Social History Narrative  . Not on file       Family History  Problem Relation Age of Onset  . Breast cancer Mother   . Emphysema Father   . Alzheimer's disease Brother      ROS:  Please see the history of present illness.     All other systems  reviewed and negative.    Physical Exam:  Blood pressure 126/76, pulse (!) 42, height 6' 2"  (1.88 m), weight 188 lb 6.4 oz (85.5 kg), SpO2 99 %. General: Well developed, well nourished male in no acute distress. Head: Normocephalic, atraumatic, sclera non-icteric, no xanthomas, nares are without discharge. EENT: normal  Lymph Nodes:  none Neck: Negative for carotid bruits. JVD not elevated. Back:without scoliosis kyphosis  Lungs: Clear bilaterally to auscultation without wheezes, rales, or rhonchi. Breathing is unlabored. Heart: slow and irrRR with S1 S2. no murmur . No rubs, or gallops appreciated. Abdomen: Soft, non-tender, non-distended with normoactive bowel sounds. No hepatomegaly. No rebound/guarding. No obvious abdominal masses. Msk:  Strength and tone appear normal for age. Extremities: No clubbing or cyanosis. No edema.  Distal pedal pulses are 2+ and equal bilaterally. Skin: Warm and Dry Neuro: Alert and oriented X 3. CN III-XII intact Grossly normal sensory and motor function . Psych:  Responds to questions appropriately with a normal affect.      Labs: Cardiac Enzymes No results for input(s): CKTOTAL, CKMB, TROPONINI in the last 72 hours. CBC Lab Results  Component Value Date   WBC 4.3 06/19/2015   HGB 13.5 06/19/2015   HCT 40.8 06/19/2015   MCV 94.7 06/19/2015   PLT 103 (L) 06/19/2015   PROTIME: No results for input(s): LABPROT, INR in the last 72 hours. Chemistry No results for input(s): NA, K, CL, CO2, BUN, CREATININE, CALCIUM, PROT, BILITOT, ALKPHOS, ALT, AST, GLUCOSE in the last 168 hours.  Invalid input(s): LABALBU Lipids Lab Results  Component Value Date   CHOL 126 06/20/2015   HDL 30 (L) 06/20/2015   LDLCALC 66 06/20/2015   TRIG 152 (H)  06/20/2015   BNP No results found for: PROBNP Thyroid Function Tests: No results for input(s): TSH, T4TOTAL, T3FREE, THYROIDAB in the last 72 hours.  Invalid input(s): FREET3 Miscellaneous No results found for: DDIMER  Radiology/Studies:  No results found.  EKG:  12/24 Sinus 58 with 2: 1 heart block  MBZ 2     Assessment and Plan:  MBZ2 Heart Block  2:1 Heart block  LBBB new  TIA  Patient has progressive conduction system disease.  New onset left bundle was noted 12/19 different from 2/17.  ECGs 12/5 and 04/17/2018 demonstrated blocked PACs some with short and some with long coupling intervals.  Today however, clear second-degree AV block type II as well as 2: 1 heart block. The absence of symptoms is reassuring; however, he is advised that if he has any episodes of lightheadedness he should present himself to the emergency room.  Otherwise, we have scheduled pacing on Thursday morning.  The benefits and risks were reviewed including but not limited to death,  perforation, infection, lead dislodgement and device malfunction.  The patient understands agrees and is willing to proceed.  Moreover, it would allow for monitoring for atrial fibrillation in the context of his prior TIA.  We have reviewed the potential role for anticoagulation    Virl Axe

## 2018-05-04 NOTE — Progress Notes (Signed)
ELECTROPHYSIOLOGY CONSULT NOTE  Patient ID: Joseph Hanna, MRN: [id number], DOB/AGE: [birth year] 84 y.o. Admit date: (Not on file) Date of Consult: 05/05/2018  Primary Physician: [name], MD Primary Cardiologist: CMAc     [name] is a 84 y.o. male who is being seen today for the evaluation of second degree heart block at the request of Dr [name].    HPI Joseph Hanna is a 84 y.o. male seen for bradycardia  Noted abrupt change in HR about 3 weeks ago 60/70s>>40s and occ 30s.  No assoc LH or exercise tolerance but has appreciated while exercising that HR will initially increase and then decrease with ongoing effort.  No hx of heart disease  Prior TIA 2017, Rx with Plavix>> ASA-- superficial bleeding \  DATE TEST EF   2/17 Echo   60-65% % LVH mod  12/19 Echo   60*-65 % LVH mod        ECG  2/17 Narrow QRS 04/16/18  Sinus w blocked PACs and LBBB 12/6/9 Sinus with blocked PACs LBBB Hx of renal cell carcinoma with L nephrectomy.      Past Medical History:  Diagnosis Date  . Arthritis   . BPH (benign prostatic hypertrophy)   . Complication of anesthesia    POST URINARY RETENTION  . ED (erectile dysfunction) of organic origin   . Glaucoma    bilateral --  right uses rx drops and left eye trabeculectomy 2011  . History of gastroesophageal reflux (GERD)   . History of renal cell cancer    S/P  LEFT NEPHRECTOMY  2003--  no recurrence  . History of ulcerative colitis    in remission  . Hyperlipidemia   . Hypothyroidism   . Keratosis, actinic   . 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 Rosacea       Surgical History:  Past Surgical History:  Procedure Laterality Date  . CATARACT EXTRACTION W/ INTRAOCULAR LENS  IMPLANT, BILATERAL    . COLONOSCOPY  FEB 2015  . GREEN LIGHT LASER TURP (TRANSURETHRAL RESECTION OF PROSTATE N/A 08/10/2013   Procedure: GREEN LIGHT LASER TURP (TRANSURETHRAL RESECTION OF PROSTATE;   Surgeon: [name], MD;  Location: [facility];  Service: Urology;  Laterality: N/A;  . [place]  . NEPHRECTOMY Left 2003  . ORIF CLAVICULAR FRACTURE  12/03/2011   Procedure: OPEN REDUCTION INTERNAL FIXATION (ORIF) CLAVICULAR FRACTURE;  Surgeon: [name], MD;  Location: [place];  Service: Orthopedics;  Laterality: Right;  . SHOULDER ARTHROSCOPY Left LEFT  2012  . TONSILLECTOMY  as child  . TRABECULECTOMY  2011   left eye (glaucoma)     Home Meds: Current Meds  Medication Sig  . aspirin 81 MG tablet Take 1 tablet (81 mg total) by mouth daily.  [name] azaTHIOprine (IMURAN) 50 MG tablet Take 75 mg by mouth every morning. Takes 1  1/2  TABS  . calcium carbonate (OS-CAL) 600 MG TABS Take 600 mg by mouth daily with breakfast.   . Cholecalciferol (VITAMIN D3) 1000 UNITS CAPS Take 2 capsules by mouth daily.   . Coenzyme Q10 (COQ10) 200 MG CAPS Take 1 capsule by mouth daily.  . fish oil-omega-3 fatty acids 1000 MG capsule Take 1 g by mouth daily.   . fluoruracil (CARAC) 0.5 % cream Apply 1 application topically 2 (two) times daily.   . Ginkgo Biloba  40 MG TABS Take 120 mg by mouth daily.   [name] levothyroxine (SYNTHROID, LEVOTHROID) 50 MCG tablet Take 50-100 mcg by mouth daily before breakfast. Take 22mg everyday except Wednesday and Sunday take 1031m.  . mesalamine (APRISO) 0.375 G 24 hr capsule Take 2,250 mg by mouth daily. TAKES 6 TABS DAILY--  TOTAL 2250MG  . mesalamine (CANASA) 1000 MG suppository Place 1,000 mg rectally at bedtime.  . metroNIDAZOLE (METROGEL) 0.75 % gel Apply 1 application topically 2 (two) times daily as needed (Rosacea).   . Multiple Vitamins-Minerals (EQ COMPLETE MULTIVIT ADULT 50+ PO) Take 1 tablet by mouth daily.  . Netarsudil-Latanoprost (ROCKLATAN) 0.02-0.005 % SOLN Apply 1 drop to eye at bedtime.  . niacin (SLO-NIACIN) 500 MG tablet Take 500 mg by mouth at bedtime.  . sildenafil (VIAGRA) 100 MG  tablet Take 50 mg by mouth daily as needed for erectile dysfunction.  . simvastatin (ZOCOR) 20 MG tablet Take 10 mg by mouth every other day. TAKES QHS  . urea (CARMOL) 20 % cream Apply topically as needed.    Allergies:  Allergies  Allergen Reactions  . Lipitor [Atorvastatin] Other (See Comments)    "severe muscle aches"  . Sporanox [Itraconazole] Rash    Social History   Socioeconomic History  . Marital status: Married    Spouse name: Not on file  . Number of children: 4  . Years of education: Not on file  . Highest education level: Not on file  Occupational History  . Occupation: ReProgrammer, systems. Financial resource strain: Not on file  . Food insecurity:    Worry: Not on file    Inability: Not on file  . Transportation needs:    Medical: Not on file    Non-medical: Not on file  Tobacco Use  . Smoking status: Former Smoker    Years: 6.00    Types: Cigarettes    Last attempt to quit: 12/01/1953    Years since quitting: 64.4  . Smokeless tobacco: Never Used  Substance and Sexual Activity  . Alcohol use: Yes    Alcohol/week: 7.0 standard drinks    Types: 7 Glasses of wine per week    Comment: glass of wine several days per week  . Drug use: No  . Sexual activity: Not on file  Lifestyle  . Physical activity:    Days per week: Not on file    Minutes per session: Not on file  . Stress: Not on file  Relationships  . Social connections:    Talks on phone: Not on file    Gets together: Not on file    Attends religious service: Not on file    Active member of club or organization: Not on file    Attends meetings of clubs or organizations: Not on file    Relationship status: Not on file  . Intimate partner violence:    Fear of current or ex partner: Not on file    Emotionally abused: Not on file    Physically abused: Not on file    Forced sexual activity: Not on file  Other Topics Concern  . Not on file  Social History Narrative  . Not on file       Family History  Problem Relation Age of Onset  . Breast cancer Mother   . Emphysema Father   . Alzheimer's disease Brother      ROS:  Please see the history of present illness.     All other systems  reviewed and negative.    Physical Exam:  Blood pressure 126/76, pulse (!) 42, height 6' 2"  (1.88 m), weight 188 lb 6.4 oz (85.5 kg), SpO2 99 %. General: Well developed, well nourished male in no acute distress. Head: Normocephalic, atraumatic, sclera non-icteric, no xanthomas, nares are without discharge. EENT: normal  Lymph Nodes:  none Neck: Negative for carotid bruits. JVD not elevated. Back:without scoliosis kyphosis  Lungs: Clear bilaterally to auscultation without wheezes, rales, or rhonchi. Breathing is unlabored. Heart: slow and irrRR with S1 S2. no murmur . No rubs, or gallops appreciated. Abdomen: Soft, non-tender, non-distended with normoactive bowel sounds. No hepatomegaly. No rebound/guarding. No obvious abdominal masses. Msk:  Strength and tone appear normal for age. Extremities: No clubbing or cyanosis. No edema.  Distal pedal pulses are 2+ and equal bilaterally. Skin: Warm and Dry Neuro: Alert and oriented X 3. CN III-XII intact Grossly normal sensory and motor function . Psych:  Responds to questions appropriately with a normal affect.      Labs: Cardiac Enzymes No results for input(s): CKTOTAL, CKMB, TROPONINI in the last 72 hours. CBC Lab Results  Component Value Date   WBC 4.3 06/19/2015   HGB 13.5 06/19/2015   HCT 40.8 06/19/2015   MCV 94.7 06/19/2015   PLT 103 (L) 06/19/2015   PROTIME: No results for input(s): LABPROT, INR in the last 72 hours. Chemistry No results for input(s): NA, K, CL, CO2, BUN, CREATININE, CALCIUM, PROT, BILITOT, ALKPHOS, ALT, AST, GLUCOSE in the last 168 hours.  Invalid input(s): LABALBU Lipids Lab Results  Component Value Date   CHOL 126 06/20/2015   HDL 30 (L) 06/20/2015   LDLCALC 66 06/20/2015   TRIG 152 (H)  06/20/2015   BNP No results found for: PROBNP Thyroid Function Tests: No results for input(s): TSH, T4TOTAL, T3FREE, THYROIDAB in the last 72 hours.  Invalid input(s): FREET3 Miscellaneous No results found for: DDIMER  Radiology/Studies:  No results found.  EKG:  12/24 Sinus 58 with 2: 1 heart block  MBZ 2     Assessment and Plan:  MBZ2 Heart Block  2:1 Heart block  LBBB new  TIA  Patient has progressive conduction system disease.  New onset left bundle was noted 12/19 different from 2/17.  ECGs 12/5 and 04/17/2018 demonstrated blocked PACs some with short and some with long coupling intervals.  Today however, clear second-degree AV block type II as well as 2: 1 heart block. The absence of symptoms is reassuring; however, he is advised that if he has any episodes of lightheadedness he should present himself to the emergency room.  Otherwise, we have scheduled pacing on Thursday morning.  The benefits and risks were reviewed including but not limited to death,  perforation, infection, lead dislodgement and device malfunction.  The patient understands agrees and is willing to proceed.  Moreover, it would allow for monitoring for atrial fibrillation in the context of his prior TIA.  We have reviewed the potential role for anticoagulation    Virl Axe

## 2018-05-05 ENCOUNTER — Institutional Professional Consult (permissible substitution): Payer: Medicare HMO | Admitting: Internal Medicine

## 2018-05-05 ENCOUNTER — Encounter: Payer: Self-pay | Admitting: Internal Medicine

## 2018-05-05 ENCOUNTER — Ambulatory Visit: Payer: Medicare HMO | Admitting: Internal Medicine

## 2018-05-05 VITALS — BP 126/76 | HR 42 | Ht 74.0 in | Wt 188.4 lb

## 2018-05-05 DIAGNOSIS — I441 Atrioventricular block, second degree: Secondary | ICD-10-CM

## 2018-05-05 DIAGNOSIS — Z8679 Personal history of other diseases of the circulatory system: Secondary | ICD-10-CM

## 2018-05-05 HISTORY — DX: Personal history of other diseases of the circulatory system: Z86.79

## 2018-05-05 HISTORY — DX: Atrioventricular block, second degree: I44.1

## 2018-05-05 NOTE — Patient Instructions (Signed)
Medication Instructions: Your physician recommends that you continue on your current medications as directed. Please refer to the Current Medication list given to you today.   Labwork: None Ordered   Procedures/Testing: Your physician has recommended that you have a pacemaker inserted. A pacemaker is a small device that is placed under the skin of your chest or abdomen to help control abnormal heart rhythms. This device uses electrical pulses to prompt the heart to beat at a normal rate. Pacemakers are used to treat heart rhythms that are too slow. Wire (leads) are attached to the pacemaker that goes into the chambers of you heart. This is done in the hospital and usually requires and overnight stay. Please see the instruction sheet given to you today for more information.    Follow-Up: Your physician recommends that you schedule a follow-up appointment in:   10-14 days with device cline  91 days with Dr. Caryl Comes   Any Additional Special Instructions Will Be Listed Below (If Applicable).    Pacemaker Implantation  A pacemaker implantation is a procedure to place (implant) a pacemaker into both of the lower chambers (ventricles) of the heart. A pacemaker is a small, battery-powered device that helps control the heartbeat. If the heart beats irregularly or too slowly (bradycardia), the pacemaker will pace the heart so that it beats at a normal rate or a programmed rate. The parts of a biventricular pacemaker include:  The pulse generator. The pulse generator contains a small computer and a memory system that is programmed to keep the heart beating at a certain rate. The pulse generator also produces the electrical signal that triggers the heart to beat. This is implanted under the skin of the upper chest, near the collarbone.  Wires (leads). The leads are placed in the left and right ventricles of the heart. The leads are connected to the pulse generator. They transmit electrical pulses  from the pulse generator to the heart. This procedure may be done to treat:  Bradycardia.  Symptoms of severe heart failure, such as shortness of breath (dyspnea).  Loss of consciousness that happens repeatedly (syncope) because of an irregular heart rate. Tell a health care provider about:  Any allergies you have.  All medicines you are taking, including vitamins, herbs, eye drops, creams, and over-the-counter medicines.  Any problems you or family members have had with anesthetic medicines.  Any blood disorders you have.  Any surgeries you have had.  Any medical conditions you have.  Whether you are pregnant or may be pregnant. What are the risks? Generally, this is a safe procedure. However, problems may occur, including:  Infection.  Bleeding.  Allergic reactions to medicines or dyes.  Damage to other structures or organs, such as your blood vessels, lungs, or heart.  Failure of the pacemaker to improve your condition. What happens before the procedure?  Ask your health care provider about: ? Changing or stopping your regular medicines. This is especially important if you are taking diabetes medicines or blood thinners. ? Taking medicines such as aspirin and ibuprofen. These medicines can thin your blood. Do not take these medicines before your procedure if your health care provider instructs you not to.  Follow instructions from your health care provider about eating or drinking restrictions.  Do not use any tobacco products for at least 24 hours before your procedure. This includes cigarettes, chewing tobacco, or e-cigarettes.  Ask your health care provider how your surgical site will be marked or identified.  You may be  given antibiotic medicine to help prevent infection.  You may have tests, including: ? Blood tests. ? Chest X-rays.  Plan to have someone take you home after the procedure.  If you go home right after the procedure, plan to have someone  with you for 24 hours. What happens during the procedure?  To reduce your risk of infection: ? Your health care team will wash or sanitize their hands. ? Your skin will be washed with soap. ? Hair may be removed from your surgical area.  An IV tube will be inserted into one of your veins.  You will be given one or more of the following: ? A medicine to help you relax (sedative). ? A medicine to make you fall asleep (general anesthetic). ? A medicine that is injected into your spine to numb the area below and slightly above the injection site (spinal anesthetic). ? A medicine that is injected into an area of your body to numb everything below the injection site (regional anesthetic).  An incision will be made in your upper chest, near your heart.  The leads will be guided into your incision, through your blood vessels, and into your ventricles. Your surgeon will use an X-ray machine (fluoroscope) to guide the leads into your heart.  The leads will be attached to your heart muscles and to the pulse generator.  The leads will be tested to make sure that they work correctly.  The pulse generator will be implanted under your skin, near your incision.  Your incision will be closed with stitches (sutures), skin glue, or adhesive tape.  A bandage (dressing) will be placed over your incision. The procedure may vary among health care providers and hospitals. What happens after the procedure?  Your blood pressure, heart rate, breathing rate, and blood oxygen level will be monitored often until the medicines you were given have worn off.  You may continue to receive fluids and medicines through an IV tube.  You will have some pain. Pain medicines will be available to help you.  You will have a chest X-ray done. This is to make sure that your pacemaker is in the right place.  You may have to wear compression stockings. These stockings help to prevent blood clots and reduce swelling in your  legs.  You will be given a pacemaker identification card. This card lists the implant date, device model, and manufacturer of your pacemaker.  Do not drive for 24 hours if you received a sedative. This information is not intended to replace advice given to you by your health care provider. Make sure you discuss any questions you have with your health care provider. Document Released: 01/22/2012 Document Revised: 12/15/2017 Document Reviewed: 01/22/2015 Elsevier Interactive Patient Education  [employer].   If you need a refill on your cardiac medications before your next appointment, please call your pharmacy.

## 2018-05-07 ENCOUNTER — Ambulatory Visit (HOSPITAL_COMMUNITY): Payer: Medicare HMO

## 2018-05-07 ENCOUNTER — Ambulatory Visit (HOSPITAL_COMMUNITY)
Admission: RE | Admit: 2018-05-07 | Discharge: 2018-05-07 | Disposition: A | Discharge status: Home / Self Care | Payer: Medicare HMO | Source: Ambulatory Visit | Attending: Internal Medicine | Admitting: Internal Medicine

## 2018-05-07 ENCOUNTER — Other Ambulatory Visit: Payer: Self-pay

## 2018-05-07 ENCOUNTER — Ambulatory Visit (HOSPITAL_COMMUNITY)
Admission: RE | Disposition: A | Discharge status: Home / Self Care | Payer: Self-pay | Source: Ambulatory Visit | Attending: Internal Medicine

## 2018-05-07 DIAGNOSIS — H409 Unspecified glaucoma: Secondary | ICD-10-CM | POA: Diagnosis not present

## 2018-05-07 DIAGNOSIS — Z883 Allergy status to other anti-infective agents status: Secondary | ICD-10-CM | POA: Insufficient documentation

## 2018-05-07 DIAGNOSIS — Z7902 Long term (current) use of antithrombotics/antiplatelets: Secondary | ICD-10-CM | POA: Diagnosis not present

## 2018-05-07 DIAGNOSIS — K219 Gastro-esophageal reflux disease without esophagitis: Secondary | ICD-10-CM | POA: Insufficient documentation

## 2018-05-07 DIAGNOSIS — Z79899 Other long term (current) drug therapy: Secondary | ICD-10-CM | POA: Insufficient documentation

## 2018-05-07 DIAGNOSIS — M1990 Unspecified osteoarthritis, unspecified site: Secondary | ICD-10-CM | POA: Diagnosis not present

## 2018-05-07 DIAGNOSIS — Z888 Allergy status to other drugs, medicaments and biological substances status: Secondary | ICD-10-CM | POA: Insufficient documentation

## 2018-05-07 DIAGNOSIS — Z87891 Personal history of nicotine dependence: Secondary | ICD-10-CM | POA: Diagnosis not present

## 2018-05-07 DIAGNOSIS — G459 Transient cerebral ischemic attack, unspecified: Secondary | ICD-10-CM | POA: Insufficient documentation

## 2018-05-07 DIAGNOSIS — Z7982 Long term (current) use of aspirin: Secondary | ICD-10-CM | POA: Diagnosis not present

## 2018-05-07 DIAGNOSIS — E039 Hypothyroidism, unspecified: Secondary | ICD-10-CM | POA: Insufficient documentation

## 2018-05-07 DIAGNOSIS — Z905 Acquired absence of kidney: Secondary | ICD-10-CM | POA: Insufficient documentation

## 2018-05-07 DIAGNOSIS — N401 Enlarged prostate with lower urinary tract symptoms: Secondary | ICD-10-CM | POA: Insufficient documentation

## 2018-05-07 DIAGNOSIS — E785 Hyperlipidemia, unspecified: Secondary | ICD-10-CM | POA: Diagnosis not present

## 2018-05-07 DIAGNOSIS — I441 Atrioventricular block, second degree: Secondary | ICD-10-CM

## 2018-05-07 DIAGNOSIS — R338 Other retention of urine: Secondary | ICD-10-CM | POA: Insufficient documentation

## 2018-05-07 DIAGNOSIS — Z950 Presence of cardiac pacemaker: Secondary | ICD-10-CM | POA: Diagnosis not present

## 2018-05-07 DIAGNOSIS — I447 Left bundle-branch block, unspecified: Secondary | ICD-10-CM | POA: Diagnosis not present

## 2018-05-07 DIAGNOSIS — Z8673 Personal history of transient ischemic attack (TIA), and cerebral infarction without residual deficits: Secondary | ICD-10-CM | POA: Diagnosis not present

## 2018-05-07 DIAGNOSIS — Z79890 Hormone replacement therapy (postmenopausal): Secondary | ICD-10-CM | POA: Insufficient documentation

## 2018-05-07 DIAGNOSIS — Z959 Presence of cardiac and vascular implant and graft, unspecified: Secondary | ICD-10-CM

## 2018-05-07 HISTORY — PX: PACEMAKER IMPLANT: EP1218

## 2018-05-07 LAB — PLATELET COUNT: Platelets: 68 10*3/uL — ABNORMAL LOW (ref 150–400)

## 2018-05-07 LAB — SURGICAL PCR SCREEN
MRSA, PCR: NEGATIVE
Staphylococcus aureus: NEGATIVE

## 2018-05-07 SURGERY — PACEMAKER IMPLANT

## 2018-05-07 MED ORDER — CEFAZOLIN SODIUM-DEXTROSE 2-4 GM/100ML-% IV SOLN
2.0000 g | INTRAVENOUS | Status: AC
  Administered 2018-05-07: 2 g via INTRAVENOUS

## 2018-05-07 MED ORDER — ACETAMINOPHEN 325 MG PO TABS
325.0000 mg | ORAL_TABLET | ORAL | Status: DC | PRN
  Filled 2018-05-07: qty 2

## 2018-05-07 MED ORDER — ONDANSETRON HCL 4 MG/2ML IJ SOLN: 4.0000 mg | Freq: Four times a day (QID) | INTRAMUSCULAR | Status: DC | PRN

## 2018-05-07 MED ORDER — MIDAZOLAM HCL 5 MG/5ML IJ SOLN
INTRAMUSCULAR | Status: AC
  Filled 2018-05-07: qty 5

## 2018-05-07 MED ORDER — IOPAMIDOL (ISOVUE-370) INJECTION 76%
INTRAVENOUS | Status: DC | PRN
  Administered 2018-05-07: 15 mL via INTRAVENOUS

## 2018-05-07 MED ORDER — SODIUM CHLORIDE 0.9% FLUSH: 3.0000 mL | Freq: Two times a day (BID) | INTRAVENOUS | Status: DC

## 2018-05-07 MED ORDER — SODIUM CHLORIDE 0.9 % IV SOLN
80.0000 mg | INTRAVENOUS | Status: AC
  Administered 2018-05-07: 80 mg

## 2018-05-07 MED ORDER — HEPARIN (PORCINE) IN NACL 1000-0.9 UT/500ML-% IV SOLN
INTRAVENOUS | Status: DC | PRN
  Administered 2018-05-07: 500 mL

## 2018-05-07 MED ORDER — CHLORHEXIDINE GLUCONATE 4 % EX LIQD: 60.0000 mL | Freq: Once | CUTANEOUS | Status: DC

## 2018-05-07 MED ORDER — HEPARIN (PORCINE) IN NACL 1000-0.9 UT/500ML-% IV SOLN
INTRAVENOUS | Status: AC
  Filled 2018-05-07: qty 500

## 2018-05-07 MED ORDER — HYDRALAZINE HCL 20 MG/ML IJ SOLN
INTRAMUSCULAR | Status: AC
  Administered 2018-05-07: 10 mg via INTRAVENOUS
  Filled 2018-05-07: qty 1

## 2018-05-07 MED ORDER — HYDRALAZINE HCL 20 MG/ML IJ SOLN
10.0000 mg | Freq: Once | INTRAMUSCULAR | Status: AC
  Administered 2018-05-07: 10 mg via INTRAVENOUS

## 2018-05-07 MED ORDER — FENTANYL CITRATE (PF) 100 MCG/2ML IJ SOLN
INTRAMUSCULAR | Status: AC
  Filled 2018-05-07: qty 2

## 2018-05-07 MED ORDER — HYDROCODONE-ACETAMINOPHEN 5-325 MG PO TABS: 1.0000 | ORAL_TABLET | ORAL | Status: DC | PRN

## 2018-05-07 MED ORDER — SODIUM CHLORIDE 0.9 % IV SOLN
INTRAVENOUS | Status: AC
  Filled 2018-05-07: qty 2

## 2018-05-07 MED ORDER — CEFAZOLIN SODIUM-DEXTROSE 2-4 GM/100ML-% IV SOLN
INTRAVENOUS | Status: AC
  Filled 2018-05-07: qty 100

## 2018-05-07 MED ORDER — LIDOCAINE HCL (PF) 1 % IJ SOLN
INTRAMUSCULAR | Status: AC
  Filled 2018-05-07: qty 30

## 2018-05-07 MED ORDER — SODIUM CHLORIDE 0.9 % IV SOLN: 250.0000 mL | INTRAVENOUS | Status: DC | PRN

## 2018-05-07 MED ORDER — CEFAZOLIN SODIUM-DEXTROSE 1-4 GM/50ML-% IV SOLN
1.0000 g | Freq: Four times a day (QID) | INTRAVENOUS | Status: DC
  Administered 2018-05-07: 1 g via INTRAVENOUS
  Filled 2018-05-07: qty 50

## 2018-05-07 MED ORDER — SODIUM CHLORIDE 0.9% FLUSH: 3.0000 mL | INTRAVENOUS | Status: DC | PRN

## 2018-05-07 MED ORDER — LIDOCAINE HCL (PF) 1 % IJ SOLN
INTRAMUSCULAR | Status: AC
  Filled 2018-05-07: qty 60

## 2018-05-07 MED ORDER — IOPAMIDOL (ISOVUE-370) INJECTION 76%
INTRAVENOUS | Status: AC
  Filled 2018-05-07: qty 50

## 2018-05-07 MED ORDER — MUPIROCIN 2 % EX OINT
1.0000 "application " | TOPICAL_OINTMENT | Freq: Once | CUTANEOUS | Status: AC
  Administered 2018-05-07: 1 via TOPICAL
  Filled 2018-05-07: qty 22

## 2018-05-07 MED ORDER — LIDOCAINE HCL (PF) 1 % IJ SOLN
INTRAMUSCULAR | Status: DC | PRN
  Administered 2018-05-07: 75 mL

## 2018-05-07 MED ORDER — SODIUM CHLORIDE 0.9 % IV SOLN
INTRAVENOUS | Status: DC
  Administered 2018-05-07: 06:00:00 via INTRAVENOUS

## 2018-05-07 MED ORDER — SODIUM CHLORIDE 0.9 % IV SOLN
INTRAVENOUS | Status: DC
  Administered 2018-05-07: 07:00:00 via INTRAVENOUS

## 2018-05-07 SURGICAL SUPPLY — 7 items
CABLE SURGICAL S-101-97-12 (CABLE) ×2 IMPLANT
LEAD TENDRIL MRI 52CM LPA1200M (Lead) ×1 IMPLANT
LEAD TENDRIL MRI 58CM LPA1200M (Lead) ×1 IMPLANT
PACEMAKER ASSURITY DR-RF (Pacemaker) ×1 IMPLANT
PAD PRO RADIOLUCENT 2001M-C (PAD) ×2 IMPLANT
SHEATH CLASSIC 8F (SHEATH) ×2 IMPLANT
TRAY PACEMAKER INSERTION (PACKS) ×2 IMPLANT

## 2018-05-07 NOTE — Progress Notes (Signed)
Spoke with Dr. Rayann Heman about families concerns with blood pressure.  Patient ok to go home.

## 2018-05-07 NOTE — Discharge Instructions (Signed)
Supplemental Discharge Instructions for  Pacemaker/Defibrillator Patients  Activity No heavy lifting or vigorous activity with your left/right arm for 6 to 8 weeks.  Do not raise your left/right arm above your head for one week.  Gradually raise your affected arm as drawn below.             05/11/18                   05/12/18                  05/13/18                     05/14/18 __  NO DRIVING for  1 week   ; you may begin driving on  09/17/82   .  WOUND CARE - Keep the wound area clean and dry.  Do not get this area wet, no showers until cleared to at your wound check visit. - The tape/steri-strips on your wound will fall off; do not pull them off.  No bandage is needed on the site otherwise.  DO  NOT apply any creams, oils, or ointments to the wound area. - If you notice any drainage or discharge from the wound, any swelling or bruising at the site, or you develop a fever > 101? F after you are discharged home, call the office at once.  Special Instructions - You are still able to use cellular telephones; use the ear opposite the side where you have your pacemaker/defibrillator.  Avoid carrying your cellular phone near your device. - When traveling through airports, show security personnel your identification card to avoid being screened in the metal detectors.  Ask the security personnel to use the hand wand. - Avoid arc welding equipment, MRI testing (magnetic resonance imaging), TENS units (transcutaneous nerve stimulators).  Call the office for questions about other devices. - Avoid electrical appliances that are in poor condition or are not properly grounded. - Microwave ovens are safe to be near or to operate.     Pacemaker Implantation, Adult, Care After This sheet gives you information about how to care for yourself after your procedure. Your health care provider may also give you more specific instructions. If you have problems or questions, contact your health care  provider. What can I expect after the procedure? After the procedure, it is common to have:  Mild pain.  Slight bruising.  Some swelling over the incision.  A slight bump over the skin where the device was placed. Sometimes, it is possible to feel the device under the skin. This is normal. Follow these instructions at home: Medicines  Take over-the-counter and prescription medicines only as told by your health care provider.  If you were prescribed an antibiotic medicine, take it as told by your health care provider. Do not stop taking the antibiotic even if you start to feel better. Wound care   Do not remove the plastic bandage for 24 hours  After your bandage is removed, you may see pieces of tape called skin adhesive strips over the area where the cut was made (incision site). Let them fall off on their own.  Check the incision site every day to make sure it is not infected, bleeding, or starting to pull apart.  Do not use lotions or ointments near the incision site unless directed to do so.  Activity  Do not drive or use heavy machinery while taking prescription pain medicine.  Check with  your health care provider before you start to drive or play sports.  Avoid sudden jerking, pulling, or chopping movements that pull your upper arm far away from your body. Avoid these movements for at least 6 weeks or as long as told by your health care provider.  Do not lift your upper arm above your shoulders for at least 6 weeks or as long as told by your health care provider. This means no tennis, golf, or swimming.  You may go back to work when your health care provider says it is okay. Pacemaker care  You may be shown how to transfer data from your pacemaker through the phone to your health care provider.  Always let all health care providers know about your pacemaker before you have any medical procedures or tests.  Wear a medical ID bracelet or necklace stating that you have  a pacemaker. Carry a pacemaker ID card with you at all times.  Your pacemaker battery will last for 5-15 years. Routine checks by your health care provider will let the health care provider know when the battery is starting to run down. The pacemaker will need to be replaced when the battery starts to run down.  Do not use amateur Chief of Staff. Other electrical devices are safe to use, including power tools, lawn mowers, and speakers. If you are unsure of whether something is safe to use, ask your health care provider.  When using your cell phone, hold it to the ear opposite the pacemaker. Do not leave your cell phone in a pocket over the pacemaker.  Avoid places or objects that have a strong electric or magnetic field, including: ? Airport Herbalist. When at the airport, let officials know that you have a pacemaker. ? Power plants. ? Large electrical generators. ? Radiofrequency transmission towers, such as cell phone and radio towers. General instructions  Weigh yourself every day. If you suddenly gain weight, fluid may be building up in your body.  Keep all follow-up visits as told by your health care provider. This is important. Contact a health care provider if:  You gain weight suddenly.  Your legs or feet swell.  It feels like your heart is fluttering or skipping beats (heart palpitations).  You have chills or a fever.  You have more redness, swelling, or pain around your incisions.  You have more fluid or blood coming from your incisions.  Your incisions feel warm to the touch.  You have pus or a bad smell coming from your incisions. Get help right away if:  You have chest pain.  You have trouble breathing or are short of breath.  You become extremely tired.  You are light-headed or you faint. This information is not intended to replace advice given to you by your health care provider. Make sure you discuss any questions you have  with your health care provider. Document Released: 11/16/2004 Document Revised: 02/09/2016 Document Reviewed: 02/09/2016 Elsevier Interactive Patient Education  2019 [employer].

## 2018-05-07 NOTE — Interval H&P Note (Signed)
History and Physical Interval Note:  05/07/2018 6:12 AM  Joseph Hanna  has presented today for surgery, with the diagnosis of 2 degree HB  The various methods of treatment have been discussed with the patient and family. After consideration of risks, benefits and other options for treatment, the patient has consented to  Procedure(s): PACEMAKER IMPLANT (N/A) as a surgical intervention .  The patient's history has been reviewed, patient examined, no change in status, stable for surgery.  I have reviewed the patient's chart and labs.  Questions were answered to the patient's satisfaction.     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  He reports fatigue.  He has not been able to exercise since being diagnosed. No reversible causes have been found.  I would therefore recommend pacemaker implantation at this time.  Risks, benefits, alternatives to pacemaker implantation were discussed in detail with the patient today. The patient understands that the risks include but are not limited to bleeding, infection, pneumothorax, perforation, tamponade, vascular damage, renal failure, MI, stroke, death,  and lead dislodgement and wishes to proceed at this time.  Thompson Grayer MD, [facility] 05/07/2018 6:13 AM

## 2018-05-07 NOTE — Progress Notes (Signed)
Doing very well s/p PPM implantation.  Pocket looks good. CXR reveals stable leads, no ptx.  Device interrogation is personally reviewed and normal.  DC to home. Stop ASA x 1 week Remove sling in AM Remove pressure dressing and outer dressing in am  Routine wound care Follow-up has been arranged.  Thompson Grayer MD, [facility] 05/07/2018 4:11 PM

## 2018-05-08 ENCOUNTER — Encounter (HOSPITAL_COMMUNITY): Payer: Self-pay | Admitting: Internal Medicine

## 2018-05-08 MED FILL — Gentamicin Sulfate Inj 40 MG/ML: INTRAMUSCULAR | Qty: 80 | Status: AC

## 2018-05-08 MED FILL — Cefazolin Sodium-Dextrose IV Solution 2 GM/100ML-4%: INTRAVENOUS | Qty: 100 | Status: AC

## 2018-05-13 DIAGNOSIS — K8020 Calculus of gallbladder without cholecystitis without obstruction: Secondary | ICD-10-CM

## 2018-05-13 DIAGNOSIS — K760 Fatty (change of) liver, not elsewhere classified: Secondary | ICD-10-CM

## 2018-05-13 HISTORY — DX: Fatty (change of) liver, not elsewhere classified: K76.0

## 2018-05-13 HISTORY — DX: Calculus of gallbladder without cholecystitis without obstruction: K80.20

## 2018-05-19 ENCOUNTER — Ambulatory Visit (INDEPENDENT_AMBULATORY_CARE_PROVIDER_SITE_OTHER): Payer: Medicare HMO | Admitting: Nurse Practitioner

## 2018-05-19 DIAGNOSIS — I441 Atrioventricular block, second degree: Secondary | ICD-10-CM

## 2018-05-19 LAB — CUP PACEART INCLINIC DEVICE CHECK
Date Time Interrogation Session: 20200107113027
Implantable Lead Implant Date: 20191226
Implantable Lead Implant Date: 20191226
Implantable Lead Location: 753859
Implantable Lead Location: 753860
Implantable Pulse Generator Implant Date: 20191226
Pulse Gen Model: 2272
Pulse Gen Serial Number: 9094310

## 2018-05-19 NOTE — Patient Instructions (Addendum)
Medication Instructions:   Your physician recommends that you continue on your current medications as directed. Please refer to the Current Medication list given to you today.   If you need a refill on your cardiac medications before your next appointment, please call your pharmacy.   Lab work: NONE ORDERED  TODAY    If you have labs (blood work) drawn today and your tests are completely normal, you will receive your results only by: [name] MyChart Message (if you have MyChart) OR . A paper copy in the mail If you have any lab test that is abnormal or we need to change your treatment, we will call you to review the results.   Testing/Procedures:   Follow-Up:  TUES  8:30 AM WOUND CHECK   DR ALLRED 3 MONTH     Any Other Special Instructions Will Be Listed Below (If Applicable).

## 2018-05-19 NOTE — Progress Notes (Signed)
Wound check appointment. Steri-strips removed. Wound without redness or edema. Incision edges approximated, wound well healed. +moderate hematoma.  Normal device function. Thresholds, sensing, and impedances consistent with implant measurements. Device programmed at 3.5V/auto capture programmed on for extra safety margin until 3 month visit. Histogram distribution appropriate for patient and level of activity. 1.7% AF burden, longest 36 minutes.  Patient educated about wound care, arm mobility, lifting restrictions. ROV in 1 week with me for wound re-check  Chanetta Marshall, NP 05/19/2018 11:30 AM

## 2018-05-26 ENCOUNTER — Ambulatory Visit (INDEPENDENT_AMBULATORY_CARE_PROVIDER_SITE_OTHER): Payer: Medicare HMO | Admitting: Nurse Practitioner

## 2018-05-26 DIAGNOSIS — I441 Atrioventricular block, second degree: Secondary | ICD-10-CM

## 2018-05-26 LAB — CUP PACEART INCLINIC DEVICE CHECK
Date Time Interrogation Session: 20200114085546
Implantable Lead Implant Date: 20191226
Implantable Lead Implant Date: 20191226
Implantable Lead Location: 753859
Implantable Lead Location: 753860
Implantable Pulse Generator Implant Date: 20191226
Pulse Gen Model: 2272
Pulse Gen Serial Number: 9094310

## 2018-05-26 NOTE — Progress Notes (Signed)
Wound recheck. Hematoma resolving. Instructions reviewed. Resume ASA. BP has been elevated recently, pt to follow up with Dr Joylene Draft.

## 2018-05-28 DIAGNOSIS — E785 Hyperlipidemia, unspecified: Secondary | ICD-10-CM | POA: Diagnosis not present

## 2018-05-28 DIAGNOSIS — E039 Hypothyroidism, unspecified: Secondary | ICD-10-CM | POA: Diagnosis not present

## 2018-05-28 DIAGNOSIS — B351 Tinea unguium: Secondary | ICD-10-CM | POA: Diagnosis not present

## 2018-05-28 DIAGNOSIS — K5190 Ulcerative colitis, unspecified, without complications: Secondary | ICD-10-CM | POA: Diagnosis not present

## 2018-05-28 DIAGNOSIS — R030 Elevated blood-pressure reading, without diagnosis of hypertension: Secondary | ICD-10-CM | POA: Diagnosis not present

## 2018-05-28 DIAGNOSIS — L719 Rosacea, unspecified: Secondary | ICD-10-CM | POA: Diagnosis not present

## 2018-05-28 DIAGNOSIS — H409 Unspecified glaucoma: Secondary | ICD-10-CM | POA: Diagnosis not present

## 2018-05-28 DIAGNOSIS — L570 Actinic keratosis: Secondary | ICD-10-CM | POA: Diagnosis not present

## 2018-05-28 DIAGNOSIS — M1990 Unspecified osteoarthritis, unspecified site: Secondary | ICD-10-CM | POA: Diagnosis not present

## 2018-05-28 DIAGNOSIS — N529 Male erectile dysfunction, unspecified: Secondary | ICD-10-CM | POA: Diagnosis not present

## 2018-06-02 DIAGNOSIS — R7301 Impaired fasting glucose: Secondary | ICD-10-CM | POA: Diagnosis not present

## 2018-06-02 DIAGNOSIS — E7849 Other hyperlipidemia: Secondary | ICD-10-CM | POA: Diagnosis not present

## 2018-06-02 DIAGNOSIS — R82998 Other abnormal findings in urine: Secondary | ICD-10-CM | POA: Diagnosis not present

## 2018-06-02 DIAGNOSIS — Z125 Encounter for screening for malignant neoplasm of prostate: Secondary | ICD-10-CM | POA: Diagnosis not present

## 2018-06-02 DIAGNOSIS — E038 Other specified hypothyroidism: Secondary | ICD-10-CM | POA: Diagnosis not present

## 2018-06-02 DIAGNOSIS — N183 Chronic kidney disease, stage 3 (moderate): Secondary | ICD-10-CM | POA: Diagnosis not present

## 2018-06-03 ENCOUNTER — Telehealth: Payer: Self-pay | Admitting: Hematology

## 2018-06-03 NOTE — Telephone Encounter (Signed)
Called and spoke with patient regarding appointment date/time/location/ letter/calendar mailed to patient

## 2018-06-04 DIAGNOSIS — R69 Illness, unspecified: Secondary | ICD-10-CM | POA: Diagnosis not present

## 2018-06-09 ENCOUNTER — Telehealth: Payer: Self-pay | Admitting: Internal Medicine

## 2018-06-09 DIAGNOSIS — G458 Other transient cerebral ischemic attacks and related syndromes: Secondary | ICD-10-CM | POA: Diagnosis not present

## 2018-06-09 DIAGNOSIS — Z0000 Encounter for general adult medical examination without abnormal findings: Secondary | ICD-10-CM | POA: Diagnosis not present

## 2018-06-09 DIAGNOSIS — N183 Chronic kidney disease, stage 3 (moderate): Secondary | ICD-10-CM | POA: Diagnosis not present

## 2018-06-09 DIAGNOSIS — C649 Malignant neoplasm of unspecified kidney, except renal pelvis: Secondary | ICD-10-CM | POA: Diagnosis not present

## 2018-06-09 DIAGNOSIS — R011 Cardiac murmur, unspecified: Secondary | ICD-10-CM | POA: Diagnosis not present

## 2018-06-09 DIAGNOSIS — I7389 Other specified peripheral vascular diseases: Secondary | ICD-10-CM | POA: Diagnosis not present

## 2018-06-09 DIAGNOSIS — N401 Enlarged prostate with lower urinary tract symptoms: Secondary | ICD-10-CM | POA: Diagnosis not present

## 2018-06-09 DIAGNOSIS — I498 Other specified cardiac arrhythmias: Secondary | ICD-10-CM | POA: Diagnosis not present

## 2018-06-09 DIAGNOSIS — K5190 Ulcerative colitis, unspecified, without complications: Secondary | ICD-10-CM | POA: Diagnosis not present

## 2018-06-09 DIAGNOSIS — R310 Gross hematuria: Secondary | ICD-10-CM | POA: Diagnosis not present

## 2018-06-09 DIAGNOSIS — Q600 Renal agenesis, unilateral: Secondary | ICD-10-CM | POA: Diagnosis not present

## 2018-06-09 NOTE — Telephone Encounter (Signed)
New Message        [facility] Pre-operative Risk Assessment    Request for surgical clearance:  1. What type of surgery is being performed? Trans urtheral resection of the prostate  2. When is this surgery scheduled? TBD  3. What type of clearance is required (medical clearance vs. Pharmacy clearance to hold med vs. Both)? Both  4. Are there any medications that need to be held prior to surgery and how long? Aspirin held 5 days prior   5. Practice name and name of physician performing surgery? [facility] Dr. Festus Aloe   6. What is your office phone number [phone]    7.   What is your office fax number [phone]  8.   Anesthesia type (None, local, MAC, general) ? General    Joseph Hanna 06/09/2018, 3:53 PM  _________________________________________________________________   (provider comments below)

## 2018-06-10 DIAGNOSIS — Z1212 Encounter for screening for malignant neoplasm of rectum: Secondary | ICD-10-CM | POA: Diagnosis not present

## 2018-06-16 ENCOUNTER — Other Ambulatory Visit: Payer: Self-pay | Admitting: Hematology

## 2018-06-16 DIAGNOSIS — D696 Thrombocytopenia, unspecified: Secondary | ICD-10-CM

## 2018-06-16 NOTE — Telephone Encounter (Signed)
° °  Connie from [facility] calling to check status of clearance

## 2018-06-16 NOTE — Progress Notes (Addendum)
[facility] NOTE  Patient Care Team: [name], MD as PCP - General (Internal Medicine) Burnell Blanks, MD as PCP - Cardiology (Cardiology)  HEME/ONC OVERVIEW: 1. Thrombocytopenia -Plts noted to be 68k in 04/2018; plts 107k on last known CBC in 2017  ASSESSMENT & PLAN:   Thrombocytopenia -I reviewed the patient's records in detail, including cardiology clinic notes, hospitalization records in 04/2018, and lab studies dating back to 2017 -In summary, patient was admitted for symptomatic bradycardia secondary to Mobitz II AV block, for which he underwent pacemaker placement in 04/2018; platelet count incidentally noted to be low at 68k; last known CBC in 2017 showed platelet count 107k with normal Hgb and WBC. Patient has been on azathioprine for ulcerative colitis for nearly 10 years.  -Clinically, patient denies any symptoms of abnormal bleeding or bruising -Plt 80k today -I reviewed the peripheral blood smear, which showed intermittent immature platelets with occasional giant platelets, which is non-specific and can be seen in increased consumption or turnover, such as ITP or medication side effect  -Coagulation studies normal, arguing against DIC  -Nutritional and viral studies pending -I also ordered abdominal US to assess for any liver abnormalities and splenomegaly -Given the chronic thrombocytopenia since at least 2017, I suspect that the thrombocytopenia is in part due to medication side effect from azathioprine -Other ddx includes chronic ITP, but the platelet count has been relatively stable since 2017, making it less likely -Based on the studies above, we will determine if any further work-up is indicated  -I counseled the patient on any discerning symptoms, including persistent epistaxis, hematemesis, hemoptysis, hematochezia, melena or hematuria, for which patient should seek care promptly  Orders Placed This Encounter  Procedures  . US  Abdomen Complete    Standing Status:   Future    Standing Expiration Date:   06/26/2019    Order Specific Question:   Reason for Exam (SYMPTOM  OR DIAGNOSIS REQUIRED)    Answer:   Thrombocytopenia    Order Specific Question:   Preferred imaging location?    Answer:   [facility]  . CBC with Differential (Cancer Center Only)    Standing Status:   Future    Standing Expiration Date:   07/31/2019  . CMP ([facility] only)    Standing Status:   Future    Standing Expiration Date:   07/31/2019  . Save Smear ([facility])    Standing Status:   Future    Standing Expiration Date:   06/27/2019  . Lactate dehydrogenase    Standing Status:   Future    Standing Expiration Date:   07/31/2019   All questions were answered. The patient knows to call the clinic with any problems, questions or concerns.  Return in 4 weeks for labs and clinic follow-up.    Tish Men, MD 06/26/2018 3:08 PM   CHIEF COMPLAINTS/PURPOSE OF CONSULTATION:  "I am told that my platelet is low"  HISTORY OF PRESENTING ILLNESS:  Joseph Hanna 85 y.o. male is here because of progressive thrombocytopenia.  Patient was admitted in 04/2018 for symptomatic bradycardia secondary to Mobitz Type II AV block, for which he underwent pacemaker placement.  Platelet count pre-procedure was noted to be low at 68k.  Patient reports that he was diagnosed with kidney cancer status post left nephrectomy in 2002 and ulcerative colitis around the same time.  He was prescribed high-dose steroids for several month, and since then, his skin has been fragile, especially over the  bilateral forearms.  He has slightly increased bleeding associated injuries, such as working in the garden with small cuts from the plants.  He has been on azathioprine for the past 10 years, as well as mesalamine for his ulcerative colitis, which is currently well controlled.  He denies any other symptoms abnormal bleeding, such as epistaxis, hemoptysis, hematemesis,  hematochezia, melena, or hematuria.  MEDICAL HISTORY:  Past Medical History:  Diagnosis Date  . Arthritis   . BPH (benign prostatic hypertrophy)   . Complication of anesthesia    POST URINARY RETENTION  . ED (erectile dysfunction) of organic origin   . Glaucoma    bilateral --  right uses rx drops and left eye trabeculectomy 2011  . History of gastroesophageal reflux (GERD)   . History of renal cell cancer    S/P  LEFT NEPHRECTOMY  2003--  no recurrence  . History of ulcerative colitis    in remission  . Hyperlipidemia   . Hypothyroidism   . Keratosis, actinic   . 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 Rosacea     SURGICAL HISTORY: Past Surgical History:  Procedure Laterality Date  . CATARACT EXTRACTION W/ INTRAOCULAR LENS  IMPLANT, BILATERAL    . COLONOSCOPY  FEB 2015  . GREEN LIGHT LASER TURP (TRANSURETHRAL RESECTION OF PROSTATE N/A 08/10/2013   Procedure: GREEN LIGHT LASER TURP (TRANSURETHRAL RESECTION OF PROSTATE;  Surgeon: [name], MD;  Location: [facility];  Service: Urology;  Laterality: N/A;  . [place]  . NEPHRECTOMY Left 2003  . ORIF CLAVICULAR FRACTURE  12/03/2011   Procedure: OPEN REDUCTION INTERNAL FIXATION (ORIF) CLAVICULAR FRACTURE;  Surgeon: [name], MD;  Location: [facility];  Service: Orthopedics;  Laterality: Right;  . PACEMAKER IMPLANT N/A 05/07/2018   Procedure: PACEMAKER IMPLANT;  Surgeon: Thompson Grayer, MD;  Location: [facility];  Service: Cardiovascular;  Laterality: N/A;  . SHOULDER ARTHROSCOPY Left LEFT  2012  . TONSILLECTOMY  as child  . TRABECULECTOMY  2011   left eye (glaucoma)    SOCIAL HISTORY: Social History   Socioeconomic History  . Marital status: Married    Spouse name: Not on file  . Number of children: 4  . Years of education: Not on file  . Highest education level: Not on file  Occupational  History  . Occupation: Programmer, systems  . Financial resource strain: Not on file  . Food insecurity:    Worry: Not on file    Inability: Not on file  . Transportation needs:    Medical: Not on file    Non-medical: Not on file  Tobacco Use  . Smoking status: Former Smoker    Years: 6.00    Types: Cigarettes    Last attempt to quit: 12/01/1953    Years since quitting: 64.6  . Smokeless tobacco: Never Used  Substance and Sexual Activity  . Alcohol use: Yes    Alcohol/week: 7.0 standard drinks    Types: 7 Glasses of wine per week    Comment: glass of wine several days per week  . Drug use: No  . Sexual activity: Not on file  Lifestyle  . Physical activity:    Days per week: Not on file    Minutes per session: Not on file  . Stress: Not on file  Relationships  . Social connections:    Talks on phone: Not on  file    Gets together: Not on file    Attends religious service: Not on file    Active member of club or organization: Not on file    Attends meetings of clubs or organizations: Not on file    Relationship status: Not on file  . Intimate partner violence:    Fear of current or ex partner: Not on file    Emotionally abused: Not on file    Physically abused: Not on file    Forced sexual activity: Not on file  Other Topics Concern  . Not on file  Social History Narrative  . Not on file    FAMILY HISTORY: Family History  Problem Relation Age of Onset  . Breast cancer Mother   . Emphysema Father   . Alzheimer's disease Brother     ALLERGIES:  is allergic to lipitor [atorvastatin] and sporanox [itraconazole].  MEDICATIONS:  Current Outpatient Medications  Medication Sig Dispense Refill  . aspirin EC 81 MG tablet Take 81 mg by mouth daily.    . dorzolamide-timolol (COSOPT) 22.3-6.8 MG/ML ophthalmic solution Place 1 drop into the right eye 3 (three) times daily.    [name] azaTHIOprine (IMURAN) 50 MG tablet Take 75 mg by mouth every morning. Takes 1  1/2   TABS    . Cholecalciferol (VITAMIN D3) 1000 UNITS CAPS Take 2 capsules by mouth daily.     . Coenzyme Q10 (COQ10) 200 MG CAPS Take 1 capsule by mouth daily.    . fish oil-omega-3 fatty acids 1000 MG capsule Take 1 g by mouth daily.     . fluoruracil (CARAC) 0.5 % cream Apply 1 application topically 2 (two) times daily.     . Ginkgo Biloba 40 MG TABS Take 120 mg by mouth daily.     [name] levothyroxine (SYNTHROID, LEVOTHROID) 50 MCG tablet Take 50-100 mcg by mouth daily before breakfast. Take 45mcg everyday except Wednesday and Sunday take 177mcg.    . mesalamine (APRISO) 0.375 G 24 hr capsule Take 2,250 mg by mouth daily. TAKES 6 TABS DAILY--  TOTAL 2250MG     . mesalamine (CANASA) 1000 MG suppository Place 1,000 mg rectally at bedtime.    . metroNIDAZOLE (METROGEL) 0.75 % gel Apply 1 application topically 2 (two) times daily as needed (Rosacea).     . Multiple Vitamins-Minerals (EQ COMPLETE MULTIVIT ADULT 50+ PO) Take 1 tablet by mouth daily.    . Netarsudil-Latanoprost (ROCKLATAN) 0.02-0.005 % SOLN Apply 1 drop to eye at bedtime.    . niacin (SLO-NIACIN) 500 MG tablet Take 500 mg by mouth at bedtime.    . simvastatin (ZOCOR) 20 MG tablet Take 10 mg by mouth every other day. TAKES QHS    . urea (CARMOL) 20 % cream Apply topically as needed.     No current facility-administered medications for this visit.     REVIEW OF SYSTEMS:   Constitutional: ( - ) fevers, ( - )  chills , ( - ) night sweats Eyes: ( - ) blurriness of vision, ( - ) double vision, ( - ) watery eyes Ears, nose, mouth, throat, and face: ( - ) mucositis, ( - ) sore throat Respiratory: ( - ) cough, ( - ) dyspnea, ( - ) wheezes Cardiovascular: ( - ) palpitation, ( - ) chest discomfort, ( - ) lower extremity swelling Gastrointestinal:  ( - ) nausea, ( - ) heartburn, ( - ) change in bowel habits Skin: ( + ) abnormal skin rashes Lymphatics: ( - )  new lymphadenopathy, ( - ) easy bruising Neurological: ( - ) numbness, ( - ) tingling, ( -  ) new weaknesses Behavioral/Psych: ( - ) mood change, ( - ) new changes  All other systems were reviewed with the patient and are negative.  PHYSICAL EXAMINATION: ECOG PERFORMANCE STATUS: 0 - Asymptomatic  Vitals:   06/26/18 1251  BP: (!) 139/53  Pulse: 62  Resp: 16  Temp: 98.1 F (36.7 C)  SpO2: 97%   Filed Weights   06/26/18 1251  Weight: 186 lb 8 oz (84.6 kg)    GENERAL: alert, no distress and comfortable, well appearing  SKIN: thin, fragile skin over bilateral forearms, with some old/faint bruises, no bleeding  EYES: conjunctiva are pink and non-injected, sclera clear OROPHARYNX: no exudate, no erythema; lips, buccal mucosa, and tongue normal  NECK: supple, non-tender LYMPH:  no palpable lymphadenopathy in the cervical LUNGS: clear to auscultation with normal breathing effort HEART: regular rate & rhythm, no murmurs, no lower extremity edema ABDOMEN: soft, non-tender, non-distended, normal bowel sounds Musculoskeletal: no cyanosis of digits and no clubbing  PSYCH: alert & oriented x 3, fluent speech NEURO: no focal motor/sensory deficits  LABORATORY DATA:  I have reviewed the data as listed Lab Results  Component Value Date   WBC 5.4 06/26/2018   HGB 13.7 06/26/2018   HCT 43.4 06/26/2018   MCV 96.2 06/26/2018   PLT 80 (L) 06/26/2018   Lab Results  Component Value Date   NA 143 06/26/2018   K 4.4 06/26/2018   CL 107 06/26/2018   CO2 29 06/26/2018    PATHOLOGY: I personally reviewed the patient's peripheral blood smear today.  The red blood cells were of normal morphology.  There was no schistocytosis.  The white blood cells were of normal morphology. There were no peripheral circulating blasts.  The platelet count was decreased, and there were intermittent immature platelets and occasional giant platelets.

## 2018-06-17 NOTE — Telephone Encounter (Signed)
Could you let Urology know we just sent the clearance

## 2018-06-17 NOTE — Telephone Encounter (Signed)
   Primary Cardiologist: Lauree Chandler, MD  Chart reviewed as part of pre-operative protocol coverage. Patient was contacted 06/17/2018 in reference to pre-operative risk assessment for pending surgery as outlined below.  Joseph Hanna was last seen on 05/19/18 by Chanetta Marshall, NP. [name]  Since that day, Joseph Hanna has done well with no known CAD but he had Permanent pacemaker placed 05/07/18.  He is now 6 weeks out and no infections.  Pt is on ASA for his hx of TIA.  We will defer Asprin question to his PCP.   Therefore, based on ACC/AHA guidelines, the patient would be at acceptable risk for the planned procedure without further cardiovascular testing.   I will route this recommendation to the requesting party via Epic fax function and remove from pre-op pool.  Please call with questions.  Cecilie Kicks, NP 06/17/2018, 2:32 PM

## 2018-06-17 NOTE — Telephone Encounter (Signed)
Left Marlowe Kays,  with [facility], detailed message letting her know that the clearance for said pt has been faxed and if she didn't receive it, to call us back.

## 2018-06-22 ENCOUNTER — Other Ambulatory Visit: Payer: Self-pay | Admitting: Urology

## 2018-06-23 DIAGNOSIS — H401133 Primary open-angle glaucoma, bilateral, severe stage: Secondary | ICD-10-CM | POA: Diagnosis not present

## 2018-06-26 ENCOUNTER — Encounter: Payer: Self-pay | Admitting: Hematology

## 2018-06-26 ENCOUNTER — Inpatient Hospital Stay: Payer: Medicare HMO

## 2018-06-26 ENCOUNTER — Inpatient Hospital Stay: Payer: Medicare HMO | Attending: Hematology | Admitting: Hematology

## 2018-06-26 ENCOUNTER — Other Ambulatory Visit: Payer: Self-pay

## 2018-06-26 VITALS — BP 139/53 | HR 62 | Temp 98.1°F | Resp 16 | Wt 186.5 lb

## 2018-06-26 DIAGNOSIS — Z87891 Personal history of nicotine dependence: Secondary | ICD-10-CM | POA: Insufficient documentation

## 2018-06-26 DIAGNOSIS — D696 Thrombocytopenia, unspecified: Secondary | ICD-10-CM

## 2018-06-26 DIAGNOSIS — Z85528 Personal history of other malignant neoplasm of kidney: Secondary | ICD-10-CM

## 2018-06-26 DIAGNOSIS — Z803 Family history of malignant neoplasm of breast: Secondary | ICD-10-CM | POA: Insufficient documentation

## 2018-06-26 DIAGNOSIS — K5190 Ulcerative colitis, unspecified, without complications: Secondary | ICD-10-CM | POA: Diagnosis not present

## 2018-06-26 DIAGNOSIS — Z905 Acquired absence of kidney: Secondary | ICD-10-CM

## 2018-06-26 LAB — CBC WITH DIFFERENTIAL (CANCER CENTER ONLY)
Abs Immature Granulocytes: 0.05 10*3/uL (ref 0.00–0.07)
Basophils Absolute: 0 10*3/uL (ref 0.0–0.1)
Basophils Relative: 0 %
Eosinophils Absolute: 0 10*3/uL (ref 0.0–0.5)
Eosinophils Relative: 0 %
HCT: 43.4 % (ref 39.0–52.0)
Hemoglobin: 13.7 g/dL (ref 13.0–17.0)
Immature Granulocytes: 1 %
Lymphocytes Relative: 22 %
Lymphs Abs: 1.2 10*3/uL (ref 0.7–4.0)
MCH: 30.4 pg (ref 26.0–34.0)
MCHC: 31.6 g/dL (ref 30.0–36.0)
MCV: 96.2 fL (ref 80.0–100.0)
Monocytes Absolute: 1.3 10*3/uL — ABNORMAL HIGH (ref 0.1–1.0)
Monocytes Relative: 23 %
Neutro Abs: 2.9 10*3/uL (ref 1.7–7.7)
Neutrophils Relative %: 54 %
Platelet Count: 80 10*3/uL — ABNORMAL LOW (ref 150–400)
RBC: 4.51 MIL/uL (ref 4.22–5.81)
RDW: 14 % (ref 11.5–15.5)
WBC Count: 5.4 10*3/uL (ref 4.0–10.5)
nRBC: 0 % (ref 0.0–0.2)

## 2018-06-26 LAB — CMP (CANCER CENTER ONLY)
ALT: 13 U/L (ref 0–44)
AST: 14 U/L — ABNORMAL LOW (ref 15–41)
Albumin: 4.8 g/dL (ref 3.5–5.0)
Alkaline Phosphatase: 39 U/L (ref 38–126)
Anion gap: 7 (ref 5–15)
BUN: 23 mg/dL (ref 8–23)
CO2: 29 mmol/L (ref 22–32)
Calcium: 10 mg/dL (ref 8.9–10.3)
Chloride: 107 mmol/L (ref 98–111)
Creatinine: 1.34 mg/dL — ABNORMAL HIGH (ref 0.61–1.24)
GFR, Est AFR Am: 56 mL/min — ABNORMAL LOW (ref >60)
GFR, Est Non Af Am: 49 mL/min — ABNORMAL LOW (ref >60)
Glucose, Bld: 110 mg/dL — ABNORMAL HIGH (ref 70–99)
Potassium: 4.4 mmol/L (ref 3.5–5.1)
Sodium: 143 mmol/L (ref 135–145)
Total Bilirubin: 0.4 mg/dL (ref 0.3–1.2)
Total Protein: 6.8 g/dL (ref 6.5–8.1)

## 2018-06-26 LAB — SAVE SMEAR(SSMR), FOR PROVIDER SLIDE REVIEW

## 2018-06-26 LAB — VITAMIN B12: Vitamin B-12: 556 pg/mL (ref 180–914)

## 2018-06-26 LAB — PROTIME-INR
INR: 1.09
Prothrombin Time: 14 seconds (ref 11.4–15.2)

## 2018-06-26 LAB — LACTATE DEHYDROGENASE: LDH: 205 U/L — ABNORMAL HIGH (ref 98–192)

## 2018-06-26 LAB — APTT: aPTT: 30 seconds (ref 24–36)

## 2018-06-26 LAB — FOLATE: Folate: 42.9 ng/mL (ref >5.9)

## 2018-06-27 LAB — HEPATITIS B SURFACE ANTIGEN: Hepatitis B Surface Ag: NEGATIVE

## 2018-06-27 LAB — HEPATITIS C ANTIBODY (REFLEX): HCV Ab: <0.1 s/co ratio (ref 0.0–0.9)

## 2018-06-27 LAB — HEPATITIS B SURFACE ANTIBODY,QUALITATIVE: Hep B S Ab: NONREACTIVE

## 2018-06-27 LAB — HCV COMMENT:

## 2018-06-27 LAB — HIV ANTIBODY (ROUTINE TESTING W REFLEX): HIV Screen 4th Generation wRfx: NONREACTIVE

## 2018-06-30 DIAGNOSIS — Z85528 Personal history of other malignant neoplasm of kidney: Secondary | ICD-10-CM | POA: Diagnosis not present

## 2018-06-30 DIAGNOSIS — Z86010 Personal history of colonic polyps: Secondary | ICD-10-CM | POA: Diagnosis not present

## 2018-06-30 DIAGNOSIS — D696 Thrombocytopenia, unspecified: Secondary | ICD-10-CM | POA: Diagnosis not present

## 2018-06-30 DIAGNOSIS — K5150 Left sided colitis without complications: Secondary | ICD-10-CM | POA: Diagnosis not present

## 2018-07-02 ENCOUNTER — Telehealth: Payer: Self-pay | Admitting: Nurse Practitioner

## 2018-07-02 NOTE — Telephone Encounter (Signed)
Lm to call back ./cy 

## 2018-07-02 NOTE — Telephone Encounter (Signed)
New message:   Patient states that some one called him and he returning the call. Please call patient.

## 2018-07-02 NOTE — Telephone Encounter (Signed)
Busy signal try later .Adonis Housekeeper

## 2018-07-02 NOTE — Telephone Encounter (Signed)
I reviewed the chart to see who may have called the pt, though I do not see where we called .

## 2018-07-03 NOTE — Telephone Encounter (Signed)
Follow up  ° ° °Patient is returning call.  °

## 2018-07-03 NOTE — Telephone Encounter (Signed)
Spoke to patient who is returning a call from an "Levada Dy."  I told him that unfortunately we do not have an Levada Dy in our office.  He said that he will try his urologist office and await another possible call from [place].  He thanked [place] for the call.

## 2018-07-07 DIAGNOSIS — L821 Other seborrheic keratosis: Secondary | ICD-10-CM | POA: Diagnosis not present

## 2018-07-07 DIAGNOSIS — L812 Freckles: Secondary | ICD-10-CM | POA: Diagnosis not present

## 2018-07-07 DIAGNOSIS — L570 Actinic keratosis: Secondary | ICD-10-CM | POA: Diagnosis not present

## 2018-07-07 DIAGNOSIS — Z85828 Personal history of other malignant neoplasm of skin: Secondary | ICD-10-CM | POA: Diagnosis not present

## 2018-07-07 DIAGNOSIS — D485 Neoplasm of uncertain behavior of skin: Secondary | ICD-10-CM | POA: Diagnosis not present

## 2018-07-09 ENCOUNTER — Ambulatory Visit (HOSPITAL_BASED_OUTPATIENT_CLINIC_OR_DEPARTMENT_OTHER)
Admission: RE | Admit: 2018-07-09 | Discharge: 2018-07-09 | Disposition: A | Discharge status: Home / Self Care | Payer: Medicare HMO | Source: Ambulatory Visit | Attending: Hematology | Admitting: Hematology

## 2018-07-09 DIAGNOSIS — D696 Thrombocytopenia, unspecified: Secondary | ICD-10-CM | POA: Diagnosis not present

## 2018-07-14 DIAGNOSIS — R030 Elevated blood-pressure reading, without diagnosis of hypertension: Secondary | ICD-10-CM | POA: Diagnosis not present

## 2018-07-20 ENCOUNTER — Encounter (HOSPITAL_COMMUNITY): Payer: Self-pay

## 2018-07-20 DIAGNOSIS — R030 Elevated blood-pressure reading, without diagnosis of hypertension: Secondary | ICD-10-CM | POA: Diagnosis not present

## 2018-07-20 NOTE — Pre-Procedure Instructions (Signed)
The following are in epic: Cardiac clearance Dorene Ar, NP 06/17/2018 Last office visit note 06/26/2018 Dr. Maylon Peppers Last office visit note 05/07/2018 Dr. Rayann Heman EKG 05/05/2018 ECHO 04/21/2018 Pacer check 05/26/2018 CXR 05/07/2018

## 2018-07-20 NOTE — Patient Instructions (Addendum)
Your procedure is scheduled on: Friday, July 24, 2018   Surgery Time:  7:30AM-9:00AM   Report to [place]  Entrance   Report to Short Stay at 5:30 AM   Call this number if you have problems the morning of surgery [phone]   Do not eat food or drink liquids :After Midnight.   Brush your teeth the morning of surgery.   Do NOT smoke after Midnight   Take these medicines the morning of surgery with A SIP OF WATER: Imuran, Levothyroxine, Mesalamine, Simvastatin   Use Eye drops per normal routine                               You may not have any metal on your body including jewelry, and body piercings             Do not wear lotions, powders, perfumes/cologne, or deodorant                         Men may shave face and neck.   Do not bring valuables to the hospital. [place].   Contacts, dentures or bridgework may not be worn into surgery.    Patients discharged the day of surgery will not be allowed to drive home   Special Instructions: Bring a copy of your healthcare power of attorney and living will documents         the day of surgery if you haven't scanned them in before.              Please read over the following fact sheets you were given:  [facility] - Preparing for Surgery Before surgery, you can play an important role.  Because skin is not sterile, your skin needs to be as free of germs as possible.  You can reduce the number of germs on your skin by washing with CHG (chlorahexidine gluconate) soap before surgery.  CHG is an antiseptic cleaner which kills germs and bonds with the skin to continue killing germs even after washing. Please DO NOT use if you have an allergy to CHG or antibacterial soaps.  If your skin becomes reddened/irritated stop using the CHG and inform your nurse when you arrive at Short Stay. Do not shave (including legs and underarms) for at least 48 hours prior to the first  CHG shower.  You may shave your face/neck.  Please follow these instructions carefully:  1.  Shower with CHG Soap the night before surgery and the  morning of surgery.  2.  If you choose to wash your hair, wash your hair first as usual with your normal  shampoo.  3.  After you shampoo, rinse your hair and body thoroughly to remove the shampoo.                             4.  Use CHG as you would any other liquid soap.  You can apply chg directly to the skin and wash.  Gently with a scrungie or clean washcloth.  5.  Apply the CHG Soap to your body ONLY FROM THE NECK DOWN.   Do   not use on face/ open  Wound or open sores. Avoid contact with eyes, ears mouth and   genitals (private parts).                       Wash face,  Genitals (private parts) with your normal soap.             6.  Wash thoroughly, paying special attention to the area where your    surgery  will be performed.  7.  Thoroughly rinse your body with warm water from the neck down.  8.  DO NOT shower/wash with your normal soap after using and rinsing off the CHG Soap.                9.  Pat yourself dry with a clean towel.            10.  Wear clean pajamas.            11.  Place clean sheets on your bed the night of your first shower and do not  sleep with pets. Day of Surgery : Do not apply any lotions/deodorants the morning of surgery.  Please wear clean clothes to the hospital/surgery center.  FAILURE TO FOLLOW THESE INSTRUCTIONS MAY RESULT IN THE CANCELLATION OF YOUR SURGERY  PATIENT SIGNATURE_________________________________  NURSE SIGNATURE__________________________________  ________________________________________________________________________       Reviewed and Endorsed by [place] Patient Education Committee, August 2015

## 2018-07-21 ENCOUNTER — Other Ambulatory Visit: Payer: Self-pay

## 2018-07-21 ENCOUNTER — Encounter (HOSPITAL_COMMUNITY): Payer: Self-pay

## 2018-07-21 ENCOUNTER — Encounter (HOSPITAL_COMMUNITY)
Admission: RE | Admit: 2018-07-21 | Discharge: 2018-07-21 | Disposition: A | Discharge status: Home / Self Care | Payer: Medicare HMO | Source: Ambulatory Visit | Attending: Urology | Admitting: Urology

## 2018-07-21 DIAGNOSIS — E039 Hypothyroidism, unspecified: Secondary | ICD-10-CM | POA: Diagnosis not present

## 2018-07-21 DIAGNOSIS — N189 Chronic kidney disease, unspecified: Secondary | ICD-10-CM | POA: Diagnosis not present

## 2018-07-21 DIAGNOSIS — Z87891 Personal history of nicotine dependence: Secondary | ICD-10-CM | POA: Diagnosis not present

## 2018-07-21 DIAGNOSIS — Z85528 Personal history of other malignant neoplasm of kidney: Secondary | ICD-10-CM | POA: Diagnosis not present

## 2018-07-21 DIAGNOSIS — Z79890 Hormone replacement therapy (postmenopausal): Secondary | ICD-10-CM | POA: Diagnosis not present

## 2018-07-21 DIAGNOSIS — Z85828 Personal history of other malignant neoplasm of skin: Secondary | ICD-10-CM | POA: Diagnosis not present

## 2018-07-21 DIAGNOSIS — R9431 Abnormal electrocardiogram [ECG] [EKG]: Secondary | ICD-10-CM

## 2018-07-21 DIAGNOSIS — N400 Enlarged prostate without lower urinary tract symptoms: Secondary | ICD-10-CM | POA: Diagnosis not present

## 2018-07-21 DIAGNOSIS — R310 Gross hematuria: Secondary | ICD-10-CM | POA: Insufficient documentation

## 2018-07-21 DIAGNOSIS — Z01818 Encounter for other preprocedural examination: Secondary | ICD-10-CM | POA: Insufficient documentation

## 2018-07-21 DIAGNOSIS — H409 Unspecified glaucoma: Secondary | ICD-10-CM | POA: Diagnosis not present

## 2018-07-21 DIAGNOSIS — Z79899 Other long term (current) drug therapy: Secondary | ICD-10-CM | POA: Diagnosis not present

## 2018-07-21 DIAGNOSIS — R3913 Splitting of urinary stream: Secondary | ICD-10-CM | POA: Insufficient documentation

## 2018-07-21 DIAGNOSIS — Z8673 Personal history of transient ischemic attack (TIA), and cerebral infarction without residual deficits: Secondary | ICD-10-CM | POA: Diagnosis not present

## 2018-07-21 DIAGNOSIS — Z7982 Long term (current) use of aspirin: Secondary | ICD-10-CM | POA: Diagnosis not present

## 2018-07-21 DIAGNOSIS — Z950 Presence of cardiac pacemaker: Secondary | ICD-10-CM | POA: Diagnosis not present

## 2018-07-21 DIAGNOSIS — E785 Hyperlipidemia, unspecified: Secondary | ICD-10-CM | POA: Diagnosis not present

## 2018-07-21 DIAGNOSIS — I129 Hypertensive chronic kidney disease with stage 1 through stage 4 chronic kidney disease, or unspecified chronic kidney disease: Secondary | ICD-10-CM | POA: Diagnosis not present

## 2018-07-21 DIAGNOSIS — N211 Calculus in urethra: Secondary | ICD-10-CM | POA: Diagnosis not present

## 2018-07-21 DIAGNOSIS — Z905 Acquired absence of kidney: Secondary | ICD-10-CM | POA: Diagnosis not present

## 2018-07-21 DIAGNOSIS — Z791 Long term (current) use of non-steroidal anti-inflammatories (NSAID): Secondary | ICD-10-CM | POA: Diagnosis not present

## 2018-07-21 HISTORY — DX: Calculus of gallbladder without cholecystitis without obstruction: K80.20

## 2018-07-21 HISTORY — DX: Personal history of other specified conditions: Z87.898

## 2018-07-21 HISTORY — DX: Fatty (change of) liver, not elsewhere classified: K76.0

## 2018-07-21 HISTORY — DX: Fracture of unspecified part of unspecified clavicle, initial encounter for closed fracture: S42.009A

## 2018-07-21 HISTORY — DX: Presence of cardiac pacemaker: Z95.0

## 2018-07-21 HISTORY — DX: Thrombocytopenia, unspecified: D69.6

## 2018-07-21 HISTORY — DX: Personal history of other diseases of the circulatory system: Z86.79

## 2018-07-21 HISTORY — DX: Cardiomegaly: I51.7

## 2018-07-21 HISTORY — DX: Atrioventricular block, second degree: I44.1

## 2018-07-21 HISTORY — DX: Malignant (primary) neoplasm, unspecified: C80.1

## 2018-07-21 HISTORY — DX: Heart disease, unspecified: I51.9

## 2018-07-21 HISTORY — DX: Chronic kidney disease, unspecified: N18.9

## 2018-07-21 HISTORY — DX: Transient cerebral ischemic attack, unspecified: G45.9

## 2018-07-21 LAB — CBC
HCT: 45.7 % (ref 39.0–52.0)
Hemoglobin: 13.7 g/dL (ref 13.0–17.0)
MCH: 30 pg (ref 26.0–34.0)
MCHC: 30 g/dL (ref 30.0–36.0)
MCV: 100 fL (ref 80.0–100.0)
Platelets: 85 10*3/uL — ABNORMAL LOW (ref 150–400)
RBC: 4.57 MIL/uL (ref 4.22–5.81)
RDW: 14.6 % (ref 11.5–15.5)
WBC: 6.5 10*3/uL (ref 4.0–10.5)
nRBC: 0 % (ref 0.0–0.2)

## 2018-07-21 LAB — BASIC METABOLIC PANEL
Anion gap: 6 (ref 5–15)
BUN: 21 mg/dL (ref 8–23)
CO2: 27 mmol/L (ref 22–32)
Calcium: 9.5 mg/dL (ref 8.9–10.3)
Chloride: 108 mmol/L (ref 98–111)
Creatinine, Ser: 1.13 mg/dL (ref 0.61–1.24)
GFR calc Af Amer: >60 mL/min (ref >60)
GFR calc non Af Amer: 60 mL/min — ABNORMAL LOW (ref >60)
Glucose, Bld: 105 mg/dL — ABNORMAL HIGH (ref 70–99)
Potassium: 4.3 mmol/L (ref 3.5–5.1)
Sodium: 141 mmol/L (ref 135–145)

## 2018-07-21 NOTE — Progress Notes (Signed)
For Anesthesia Review Patient with recent pacemaker insertion on 05/07/2018.  Followed by Dr Rayann Heman.   See note regarding blood pressure readings at preop appt.   Also note patient with hx of thrombocytopenia platelet count at preop of 07/21/18 of 85.  Routed to Dr Junious Silk.

## 2018-07-21 NOTE — Progress Notes (Signed)
Called and requested cardiology clearance from [facility] ,  [facility] for [facility].  According to epic in encounters clearance from cardiology has been faxed to them.

## 2018-07-21 NOTE — Progress Notes (Signed)
Patient's initial blood pressure was 164/72 at preop appt.  REcheck of blood pressure in right arm was 168/72 and 170/63.  Pulse- 59-94.  Patient asymptomatic.  LVMM for Emeline Darling , CMA at office of PCP, Dr Joylene Draft with above info and surgery planned for TURP on 07/24/18.  Call back phone number of [phone].

## 2018-07-22 NOTE — Progress Notes (Signed)
Final EKG done 07/21/18-epic

## 2018-07-22 NOTE — Anesthesia Preprocedure Evaluation (Deleted)
Anesthesia Evaluation    Airway        Dental   Pulmonary former smoker,           Cardiovascular      Neuro/Psych    GI/Hepatic   Endo/Other    Renal/GU      Musculoskeletal   Abdominal   Peds  Hematology   Anesthesia Other Findings   Reproductive/Obstetrics                                                              Anesthesia Evaluation  Patient identified by MRN, date of birth, ID band Patient awake    Reviewed: Allergy & Precautions, H&P , NPO status , Patient's Chart, lab work & pertinent test results  History of Anesthesia Complications (+) history of anesthetic complications  Airway Mallampati: II TM Distance: >3 FB Neck ROM: Full    Dental no notable dental hx.    Pulmonary former smoker,  breath sounds clear to auscultation  Pulmonary exam normal       Cardiovascular Exercise Tolerance: Good Rhythm:Regular Rate:Normal     Neuro/Psych negative neurological ROS  negative psych ROS   GI/Hepatic negative GI ROS, Neg liver ROS, GERD-  Medicated,  Endo/Other  Hypothyroidism   Renal/GU Renal diseaseH/O renal cell cancer  negative genitourinary   Musculoskeletal negative musculoskeletal ROS (+)   Abdominal   Peds negative pediatric ROS (+)  Hematology negative hematology ROS (+)   Anesthesia Other Findings   Reproductive/Obstetrics negative OB ROS                           Anesthesia Physical Anesthesia Plan  ASA: II  Anesthesia Plan: General   Post-op Pain Management:    Induction: Intravenous  Airway Management Planned: LMA  Additional Equipment:   Intra-op Plan:   Post-operative Plan: Extubation in OR  Informed Consent: I have reviewed the patients History and Physical, chart, labs and discussed the procedure including the risks, benefits and alternatives for the proposed anesthesia with the patient or  authorized representative who has indicated his/her understanding and acceptance.   Dental advisory given  Plan Discussed with: CRNA  Anesthesia Plan Comments:         Anesthesia Quick Evaluation                                   Anesthesia Evaluation  Patient identified by MRN, date of birth, ID band Patient awake    Reviewed: Allergy & Precautions, H&P , NPO status , Patient's Chart, lab work & pertinent test results  Airway Mallampati: II TM Distance: >3 FB Neck ROM: Full    Dental No notable dental hx. (+) Teeth Intact, Caps and Dental Advisory Given   Pulmonary neg pulmonary ROS,  breath sounds clear to auscultation  Pulmonary exam normal       Cardiovascular negative cardio ROS  Rhythm:Regular Rate:Normal     Neuro/Psych negative neurological ROS     GI/Hepatic Neg liver ROS, GERD-  Medicated and Controlled,  Endo/Other  negative endocrine ROS  Renal/GU S/p nephrectomy for renal ca   BPH    Musculoskeletal   Abdominal   Peds  Hematology negative  hematology ROS (+)   Anesthesia Other Findings   Reproductive/Obstetrics                           Anesthesia Physical Anesthesia Plan  ASA: II  Anesthesia Plan: General   Post-op Pain Management:    Induction: Intravenous  Airway Management Planned: Oral ETT  Additional Equipment:   Intra-op Plan:   Post-operative Plan: Extubation in OR  Informed Consent: I have reviewed the patients History and Physical, chart, labs and discussed the procedure including the risks, benefits and alternatives for the proposed anesthesia with the patient or authorized representative who has indicated his/her understanding and acceptance.   Dental advisory given  Plan Discussed with: CRNA and Surgeon  Anesthesia Plan Comments: (Plan routine monitors, GETA   Patient and Chandler decline ISB)        Anesthesia Quick Evaluation  Anesthesia Physical Anesthesia  Plan  ASA:   Anesthesia Plan:    Post-op Pain Management:    Induction:   PONV Risk Score and Plan:   Airway Management Planned:   Additional Equipment:   Intra-op Plan:   Post-operative Plan:   Informed Consent:   Plan Discussed with:   Anesthesia Plan Comments: (See PAT note 07/21/18, Konrad Felix, PA-C)        Anesthesia Quick Evaluation

## 2018-07-22 NOTE — Progress Notes (Signed)
Anesthesia Chart Review   Case:  [id number] Date/Time:  07/24/18 0715   Procedure:  TRANSURETHRAL RESECTION OF THE PROSTATE (TURP) (N/A )   Anesthesia type:  General   Pre-op diagnosis:  BENIGN PROSTATIC HYPERPLASIA, SPLIT STREAM, GROSS HEMATURIA   Location:  [facility]   Surgeon:  Festus Aloe, MD      DISCUSSION: 85 yo former smoker (quit 12/01/53) with h/o hypothyroidism, GERD, thrombocytopenia, renal cell cancer (s/p left nephrectomy 2003), cardiac pacemaker (05/07/2018 due to Mobitz II, perioperative prescription for ICD requested), CKD, LBBB, HLD, BPH scheduled for above procedure 07/24/18 with Dr. Festus Aloe.   Clearance received from cardiology 06/17/18.  Per Cecilie Kicks, NP, "Joseph Hanna was last seen on 05/19/18 by Chanetta Marshall, NP. Since that day, Joseph Hanna has done well with no known CAD but he had Permanent pacemaker placed 05/07/18.  He is now 6 weeks out and no infections.  Pt is on ASA for his hx of TIA.  We will defer Asprin question to his PCP.  Therefore, based on ACC/AHA guidelines, the patient would be at acceptable risk for the planned procedure without further cardiovascular testing."  Pt can proceed with planned procedure barring acute status change.   VS: BP (!) 172/72 (BP Location: Right Arm)   Pulse 84   Temp 36.8 C (Oral)   Resp 18   Ht 6\' 1"  (1.854 m)   Wt 85.3 kg   SpO2 99%   BMI 24.81 kg/m   PROVIDERS: [name], MD is PCP   Lauree Chandler, MD is Cardiologist  LABS: Labs reviewed: Acceptable for surgery. (all labs ordered are listed, but only abnormal results are displayed)  Labs Reviewed  CBC - Abnormal; Notable for the following components:      Result Value   Platelets 85 (*)    All other components within normal limits  BASIC METABOLIC PANEL - Abnormal; Notable for the following components:   Glucose, Bld 105 (*)    GFR calc non Af Amer 60 (*)    All other components within normal limits      IMAGES: Carotid Doppler 06/20/15 Summary: There is no obvious evidence of hemodynamically significant internal carotid artery stenosis bilaterally. Vertebral arteries are patent with antegrade flow.  EKG: 07/21/2018 Rate 63 bpm AV dual-paced rhythm   CV: Echo 04/21/18 Study Conclusions  - Left ventricle: The cavity size was normal. Wall thickness was   increased in a pattern of moderate LVH. Systolic function was   normal. The estimated ejection fraction was in the range of 60%   to 65%. Wall motion was normal; there were no regional wall   motion abnormalities. Doppler parameters are consistent with   abnormal left ventricular relaxation (grade 1 diastolic   dysfunction). The E/e&' ratio is between 8-15, suggesting   indeterminate LV filling pressure. - Mitral valve: Mildly thickened leaflets . There was mild   regurgitation. - Left atrium: The atrium was normal in size. - Inferior vena cava: The vessel was normal in size. The   respirophasic diameter changes were in the normal range (>= 50%),   consistent with normal central venous pressure.  Impressions:  - LVEF 60-65%, moderate LVH, normal wall motion, grade 1 DD,   indeterminate LV filling pressure, mild MR, normal LA size,   normal IVC. Past Medical History:  Diagnosis Date  . Arthritis   . BPH (benign prostatic hypertrophy)   . Cancer ([place])    kidney , skin cancer   .  CKD (chronic kidney disease)   . Clavicle fracture    Right  . ED (erectile dysfunction) of organic origin   . Fatty liver 2020   [place]  . Gallstones 2020   [place]  . GERD (gastroesophageal reflux disease)   . Glaucoma    bilateral --  right uses rx drops and left eye trabeculectomy 2011  . Grade I diastolic dysfunction 33/29/5188   Noted on ECHO  . History of bradycardia   . History of gastroesophageal reflux (GERD)   . History of left bundle branch block (LBBB) 05/05/2018   Noted on EKG  . History of renal cell cancer    S/P  LEFT  NEPHRECTOMY  2003--  no recurrence  . History of ulcerative colitis    in remission  . Hyperlipidemia   . Hypothyroidism   . Keratosis, actinic   . 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 LVH (left ventricular hypertrophy) 04/21/2018   Mode, noted on ECHO  . Mobitz II 05/05/2018   Noted on EKG  . Presence of permanent cardiac pacemaker   . Rosacea   . Thrombocytopenia ([place])   . TIA (transient ischemic attack) 06/20/2015  . TIA (transient ischemic attack)    2017     Past Surgical History:  Procedure Laterality Date  . bilateral knee meniscus surgery     . CATARACT EXTRACTION W/ INTRAOCULAR LENS  IMPLANT, BILATERAL    . COLONOSCOPY  FEB 2015  . GREEN LIGHT LASER TURP (TRANSURETHRAL RESECTION OF PROSTATE N/A 08/10/2013   Procedure: GREEN LIGHT LASER TURP (TRANSURETHRAL RESECTION OF PROSTATE;  Surgeon: [name], MD;  Location: [facility];  Service: Urology;  Laterality: N/A;  . [place]  . MOHS SURGERY     x 2  . NEPHRECTOMY Left 2003  . ORIF CLAVICULAR FRACTURE  12/03/2011   Procedure: OPEN REDUCTION INTERNAL FIXATION (ORIF) CLAVICULAR FRACTURE;  Surgeon: [name], MD;  Location: [facility];  Service: Orthopedics;  Laterality: Right;  . PACEMAKER IMPLANT N/A 05/07/2018   Procedure: PACEMAKER IMPLANT;  Surgeon: Thompson Grayer, MD;  Location: [facility];  Service: Cardiovascular;  Laterality: N/A;  . right shoulder surgery      done at [facility]   . SHOULDER ARTHROSCOPY Left LEFT  2012  . TONSILLECTOMY  as child  . TRABECULECTOMY  2011   left eye (glaucoma)    MEDICATIONS: . aspirin EC 81 MG tablet  . azaTHIOprine (IMURAN) 50 MG tablet  . Cholecalciferol (VITAMIN D3) 1000 UNITS CAPS  . Coenzyme Q10 (COQ10) 200 MG CAPS  . dorzolamide-timolol (COSOPT) 22.3-6.8 MG/ML ophthalmic solution  . fish oil-omega-3 fatty acids 1000 MG capsule  . fluoruracil  (CARAC) 0.5 % cream  . Ginkgo Biloba 40 MG TABS  . levothyroxine (SYNTHROID, LEVOTHROID) 50 MCG tablet  . mesalamine (APRISO) 0.375 G 24 hr capsule  . mesalamine (CANASA) 1000 MG suppository  . metroNIDAZOLE (METROGEL) 0.75 % gel  . Multiple Vitamins-Minerals (EQ COMPLETE MULTIVIT ADULT 50+ PO)  . Netarsudil-Latanoprost (ROCKLATAN) 0.02-0.005 % SOLN  . niacin (SLO-NIACIN) 500 MG tablet  . simvastatin (ZOCOR) 20 MG tablet  . urea (CARMOL) 20 % cream   No current facility-administered medications for this encounter.     Maia Plan [facility] Pre-Surgical Testing [phone] 07/22/18 2:49 PM

## 2018-07-23 ENCOUNTER — Other Ambulatory Visit: Payer: Self-pay

## 2018-07-23 ENCOUNTER — Inpatient Hospital Stay (HOSPITAL_BASED_OUTPATIENT_CLINIC_OR_DEPARTMENT_OTHER): Payer: Medicare HMO | Admitting: Hematology

## 2018-07-23 ENCOUNTER — Encounter: Payer: Self-pay | Admitting: Hematology

## 2018-07-23 ENCOUNTER — Inpatient Hospital Stay: Payer: Medicare HMO | Attending: Hematology

## 2018-07-23 VITALS — BP 142/65 | HR 72 | Temp 97.8°F | Resp 18 | Ht 74.0 in | Wt 186.0 lb

## 2018-07-23 DIAGNOSIS — D696 Thrombocytopenia, unspecified: Secondary | ICD-10-CM | POA: Diagnosis not present

## 2018-07-23 DIAGNOSIS — K5190 Ulcerative colitis, unspecified, without complications: Secondary | ICD-10-CM | POA: Diagnosis not present

## 2018-07-23 LAB — CMP (CANCER CENTER ONLY)
ALT: 15 U/L (ref 0–44)
AST: 15 U/L (ref 15–41)
Albumin: 4.8 g/dL (ref 3.5–5.0)
Alkaline Phosphatase: 46 U/L (ref 38–126)
Anion gap: 6 (ref 5–15)
BUN: 22 mg/dL (ref 8–23)
CO2: 29 mmol/L (ref 22–32)
Calcium: 9.8 mg/dL (ref 8.9–10.3)
Chloride: 106 mmol/L (ref 98–111)
Creatinine: 1.33 mg/dL — ABNORMAL HIGH (ref 0.61–1.24)
GFR, Est AFR Am: 57 mL/min — ABNORMAL LOW (ref >60)
GFR, Estimated: 49 mL/min — ABNORMAL LOW (ref >60)
Glucose, Bld: 120 mg/dL — ABNORMAL HIGH (ref 70–99)
Potassium: 4.1 mmol/L (ref 3.5–5.1)
Sodium: 141 mmol/L (ref 135–145)
Total Bilirubin: 0.6 mg/dL (ref 0.3–1.2)
Total Protein: 7.3 g/dL (ref 6.5–8.1)

## 2018-07-23 LAB — CBC WITH DIFFERENTIAL (CANCER CENTER ONLY)
Abs Immature Granulocytes: 0.05 10*3/uL (ref 0.00–0.07)
Basophils Absolute: 0 10*3/uL (ref 0.0–0.1)
Basophils Relative: 0 %
Eosinophils Absolute: 0 10*3/uL (ref 0.0–0.5)
Eosinophils Relative: 0 %
HCT: 43 % (ref 39.0–52.0)
Hemoglobin: 13.7 g/dL (ref 13.0–17.0)
Immature Granulocytes: 1 %
Lymphocytes Relative: 17 %
Lymphs Abs: 1.1 10*3/uL (ref 0.7–4.0)
MCH: 30.6 pg (ref 26.0–34.0)
MCHC: 31.9 g/dL (ref 30.0–36.0)
MCV: 96.2 fL (ref 80.0–100.0)
Monocytes Absolute: 1.6 10*3/uL — ABNORMAL HIGH (ref 0.1–1.0)
Monocytes Relative: 25 %
Neutro Abs: 3.8 10*3/uL (ref 1.7–7.7)
Neutrophils Relative %: 57 %
Platelet Count: 82 10*3/uL — ABNORMAL LOW (ref 150–400)
RBC: 4.47 MIL/uL (ref 4.22–5.81)
RDW: 14.3 % (ref 11.5–15.5)
WBC Count: 6.7 10*3/uL (ref 4.0–10.5)
nRBC: 0 % (ref 0.0–0.2)

## 2018-07-23 LAB — SAVE SMEAR(SSMR), FOR PROVIDER SLIDE REVIEW

## 2018-07-23 LAB — LACTATE DEHYDROGENASE: LDH: 215 U/L — ABNORMAL HIGH (ref 98–192)

## 2018-07-23 NOTE — Progress Notes (Signed)
[facility] OFFICE PROGRESS NOTE  Patient Care Team: [name], MD as PCP - General (Internal Medicine) Burnell Blanks, MD as PCP - Cardiology (Cardiology)  HEME/ONC OVERVIEW: 1. Thrombocytopenia -Plts 107k on last known CBC in 2017; stable ~80ks since 2020  PERTINENT NON-HEM/ONC PROBLEMS: 1. Ulcerative colitis on Imuran since 2002   ASSESSMENT & PLAN:   Thrombocytopenia -Likely multifactorial, including immunosuppression with Imuran and underlying liver disease -Viral and nutritional studies unremarkable; coagulation studies normal  -Abdominal US showed increased echogenicity of the liver, suggesting fatty liver infiltration or hepatocellular disease; no suspicious focal finding; spleen size normal -Plts 82k, stable -I personably reviewed the peripheral blood smear today, which showed scattered immature platelets and some giant platelets, suggesting increased platelet production or turnover, such as ITP, liver disease and medication side effect; RBC's and WBC's were normal, and there was no apparent dysplastic change -I reviewed the lab and imaging results in detail with the patient  -Clinically, patient denies any symptoms of abnormal bleeding or bruising, such as hematemesis, hemoptysis, hematochezia, or melena -As the patient has had chronic stable thrombocytopenia dating back to at least 2017, and there is no other concurrent cytopenia, I do not see an urgent indication for further evaluation, such as bone marrow biopsy, as it would unlikely change the management -Patient has been on Imuran since 2002, but his dose was reduced from 39m daily to 536mby his gastroenterologist in light of the thrombocytopenia, but it may be too soon to see any improvement in platelet count after the dose reduction -We will monitor his CBC q3m41monthor any interval changes  -If patient develops clinically concerning symptoms, worsening thrombocytopenia, or concurrent anemia or  leukopenia, then bone marrow biopsy may be beneficial to rule out any underlying disorder such as MDS  -Patient is counseled to contact the clinic promptly if he develops any abnormal bleeding or bruising  Orders Placed This Encounter  Procedures  . CBC with Differential (Cancer Center Only)    Standing Status:   Future    Standing Expiration Date:   08/27/2019  . CMP ([facility])    Standing Status:   Future    Standing Expiration Date:   08/27/2019  . Lactate dehydrogenase    Standing Status:   Future    Standing Expiration Date:   08/27/2019   All questions were answered. The patient knows to call the clinic with any problems, questions or concerns. No barriers to learning was detected.  A total of more than 25 minutes were spent face-to-face with the patient during this encounter and over half of that time was spent on counseling and coordination of care as outlined above.   Return in 3 months for labs and clinic follow-up.   YanTish MenD 07/23/2018 2:20 PM  CHIEF COMPLAINT: "I am getting ready for my surgery"  INTERVAL HISTORY: Mr. HegHedmanturns to clinic for follow-up of chronic thrombocytopenia.  Patient reports that he has had chronic urinary hesitancy and some prostate issues, for which he had undergone laser surgery by urology before.  He is scheduled for TURP tomorrow due to a stone in the prostate.  He drinks alcohol occasionally, but denies any heavy alcohol use.  He met with his gastroenterologist recently after he was seen for thrombocytopenia in the hematology clinic, and his gastroenterologist reduce his Imuran dose from 75 mg daily to 50 mg daily.  Since the dose reduction, he has not noticed any recurrent diarrhea, abdominal pain, nausea, or  vomiting to suggest UC flare.  He denies any abnormal bleeding or bruising.  REVIEW OF SYSTEMS:   Constitutional: ( - ) fevers, ( - )  chills , ( - ) night sweats Eyes: ( - ) blurriness of vision, ( - ) double vision, ( - )  watery eyes Ears, nose, mouth, throat, and face: ( - ) mucositis, ( - ) sore throat Respiratory: ( - ) cough, ( - ) dyspnea, ( - ) wheezes Cardiovascular: ( - ) palpitation, ( - ) chest discomfort, ( - ) lower extremity swelling Gastrointestinal:  ( - ) nausea, ( - ) heartburn, ( - ) change in bowel habits Skin: ( - ) abnormal skin rashes Lymphatics: ( - ) new lymphadenopathy, ( - ) easy bruising Neurological: ( - ) numbness, ( - ) tingling, ( - ) new weaknesses Behavioral/Psych: ( - ) mood change, ( - ) new changes  All other systems were reviewed with the patient and are negative.  I have reviewed the past medical history, past surgical history, social history and family history with the patient and they are unchanged from previous note.  ALLERGIES:  is allergic to lipitor [atorvastatin] and sporanox [itraconazole].  MEDICATIONS:  Current Outpatient Medications  Medication Sig Dispense Refill  . aspirin EC 81 MG tablet Take 81 mg by mouth daily.    [name] azaTHIOprine (IMURAN) 50 MG tablet Take 75 mg by mouth every morning. Takes 1  1/2  TABS    . Cholecalciferol (VITAMIN D3) 1000 UNITS CAPS Take 2 capsules by mouth daily.     . Coenzyme Q10 (COQ10) 200 MG CAPS Take 1 capsule by mouth daily.    . dorzolamide-timolol (COSOPT) 22.3-6.8 MG/ML ophthalmic solution Place 1 drop into the right eye 3 (three) times daily.    . fish oil-omega-3 fatty acids 1000 MG capsule Take 1 g by mouth daily.     . fluoruracil (CARAC) 0.5 % cream Apply 1 application topically 2 (two) times daily.     . Ginkgo Biloba 40 MG TABS Take 120 mg by mouth daily.     [name] levothyroxine (SYNTHROID, LEVOTHROID) 50 MCG tablet Take 50-100 mcg by mouth daily before breakfast. Take 41mg everyday except Wednesday and Sunday take 1042m.    . mesalamine (APRISO) 0.375 G 24 hr capsule Take 2,250 mg by mouth daily. TAKES 6 TABS DAILY--  TOTAL 2250MG    . mesalamine (CANASA) 1000 MG suppository Place 1,000 mg rectally at bedtime.    .  metroNIDAZOLE (METROGEL) 0.75 % gel Apply 1 application topically 2 (two) times daily as needed (Rosacea).     . Multiple Vitamins-Minerals (EQ COMPLETE MULTIVIT ADULT 50+ PO) Take 1 tablet by mouth daily.    . Netarsudil-Latanoprost (ROCKLATAN) 0.02-0.005 % SOLN Apply 1 drop to eye at bedtime.    . niacin (SLO-NIACIN) 500 MG tablet Take 500 mg by mouth at bedtime.    . simvastatin (ZOCOR) 20 MG tablet Take 10 mg by mouth every other day. TAKES QHS    . urea (CARMOL) 20 % cream Apply topically as needed.     No current facility-administered medications for this visit.     PHYSICAL EXAMINATION: ECOG PERFORMANCE STATUS: 0 - Asymptomatic  Today's Vitals   07/23/18 1118  BP: (!) 142/65  Pulse: 72  Resp: 18  Temp: 97.8 F (36.6 C)  TempSrc: Oral  SpO2: 98%  Weight: 186 lb (84.4 kg)  Height: 6' 2"  (1.88 m)  PainSc: 0-No pain   Body mass  index is 23.88 kg/m.  Filed Weights   07/23/18 1118  Weight: 186 lb (84.4 kg)    GENERAL: alert, no distress and comfortable SKIN: skin color, texture, turgor are normal, no rashes or significant lesions EYES: conjunctiva are pink and non-injected, sclera clear OROPHARYNX: no exudate, no erythema; lips, buccal mucosa, and tongue normal  NECK: supple, non-tender LYMPH:  no palpable lymphadenopathy in the cervical LUNGS: clear to auscultation with normal breathing effort HEART: regular rate & rhythm and no murmurs and no lower extremity edema ABDOMEN: soft, non-tender, non-distended, normal bowel sounds Musculoskeletal: no cyanosis of digits and no clubbing  PSYCH: alert & oriented x 3, fluent speech NEURO: no focal motor/sensory deficits  LABORATORY DATA:  I have reviewed the data as listed    Component Value Date/Time   NA 141 07/23/2018 1046   K 4.1 07/23/2018 1046   CL 106 07/23/2018 1046   CO2 29 07/23/2018 1046   GLUCOSE 120 (H) 07/23/2018 1046   BUN 22 07/23/2018 1046   CREATININE 1.33 (H) 07/23/2018 1046   CALCIUM 9.8  07/23/2018 1046   PROT 7.3 07/23/2018 1046   ALBUMIN 4.8 07/23/2018 1046   AST 15 07/23/2018 1046   ALT 15 07/23/2018 1046   ALKPHOS 46 07/23/2018 1046   BILITOT 0.6 07/23/2018 1046   GFRNONAA 49 (L) 07/23/2018 1046   GFRAA 57 (L) 07/23/2018 1046    No results found for: SPEP, UPEP  Lab Results  Component Value Date   WBC 6.7 07/23/2018   NEUTROABS 3.8 07/23/2018   HGB 13.7 07/23/2018   HCT 43.0 07/23/2018   MCV 96.2 07/23/2018   PLT 82 (L) 07/23/2018      Chemistry      Component Value Date/Time   NA 141 07/23/2018 1046   K 4.1 07/23/2018 1046   CL 106 07/23/2018 1046   CO2 29 07/23/2018 1046   BUN 22 07/23/2018 1046   CREATININE 1.33 (H) 07/23/2018 1046      Component Value Date/Time   CALCIUM 9.8 07/23/2018 1046   ALKPHOS 46 07/23/2018 1046   AST 15 07/23/2018 1046   ALT 15 07/23/2018 1046   BILITOT 0.6 07/23/2018 1046       RADIOGRAPHIC STUDIES: I have personally reviewed the radiological images as listed below and agreed with the findings in the report. US Abdomen Complete  Result Date: 07/09/2018 CLINICAL DATA:  Thrombocytopenia. EXAM: ABDOMEN ULTRASOUND COMPLETE COMPARISON:  04/27/2018. FINDINGS: Gallbladder: Tiny gallstones/sludge noted. Gallbladder wall thickness is slightly prominent at 3 mm. Cholecystitis can not be completely excluded. Negative Murphy sign. Common bile duct: Diameter: 5.0 mm Liver: Increased echogenicity consistent fatty infiltration and/or hepatocellular disease. 0.8 cm simple cyst. Portal vein is patent on color Doppler imaging with normal direction of blood flow towards the liver. IVC: No abnormality visualized. Pancreas: Visualized portion unremarkable. Spleen: Size and appearance within normal limits. Right Kidney: Length: 12.6 cm. Echogenicity within normal limits. No mass or hydronephrosis visualized. Left Kidney: Length: Left nephrectomy. Abdominal aorta: No aneurysm visualized. Other findings: None. IMPRESSION: 1. Tiny  gallstones/gallbladder sludge. Minimal prominence of the gallbladder wall at 3 mm. Cholecystitis can not be completely excluded. Negative Murphy sign. 2. Increased hepatic echogenicity consistent fatty infiltration or hepatocellular disease. 3.  Left nephrectomy. Electronically Signed   By: Marcello Moores  Register   On: 07/09/2018 15:36

## 2018-07-23 NOTE — Anesthesia Preprocedure Evaluation (Addendum)
Anesthesia Evaluation    Reviewed: Allergy & Precautions, H&P , Patient's Chart, lab work & pertinent test results  Airway Mallampati: II  TM Distance: >3 FB Neck ROM: Full    Dental no notable dental hx. (+) Teeth Intact   Pulmonary neg pulmonary ROS, former smoker,    Pulmonary exam normal breath sounds clear to auscultation       Cardiovascular Exercise Tolerance: Good hypertension, Normal cardiovascular exam+ dysrhythmias + pacemaker  Rhythm:Regular Rate:Normal  ECHO 19'  LVEF 60-65%, moderate LVH, normal wall motion, grade 1 DD,   indeterminate LV filling pressure, mild MR, normal LA size,   normal IVC.   Neuro/Psych TIAnegative psych ROS   GI/Hepatic Neg liver ROS, GERD  ,  Endo/Other  Hypothyroidism   Renal/GU Renal disease  negative genitourinary   Musculoskeletal  (+) Arthritis , Osteoarthritis,    Abdominal   Peds  Hematology negative hematology ROS (+)   Anesthesia Other Findings   Reproductive/Obstetrics negative OB ROS                            Anesthesia Physical Anesthesia Plan  ASA: III  Anesthesia Plan: General   Post-op Pain Management:    Induction: Intravenous  PONV Risk Score and Plan: 2 and Ondansetron, Treatment may vary due to age or medical condition and Dexamethasone  Airway Management Planned: Oral ETT  Additional Equipment:   Intra-op Plan:   Post-operative Plan: Extubation in OR  Informed Consent: I have reviewed the patients History and Physical, chart, labs and discussed the procedure including the risks, benefits and alternatives for the proposed anesthesia with the patient or authorized representative who has indicated his/her understanding and acceptance.       Plan Discussed with: CRNA, Anesthesiologist and Surgeon  Anesthesia Plan Comments: (  )        Anesthesia Quick Evaluation

## 2018-07-24 ENCOUNTER — Ambulatory Visit (HOSPITAL_COMMUNITY): Payer: Medicare HMO | Admitting: Anesthesiology

## 2018-07-24 ENCOUNTER — Encounter (HOSPITAL_COMMUNITY)
Admission: RE | Disposition: A | Discharge status: Home / Self Care | Payer: Self-pay | Source: Home / Self Care | Attending: Urology

## 2018-07-24 ENCOUNTER — Ambulatory Visit (HOSPITAL_COMMUNITY)
Admission: RE | Admit: 2018-07-24 | Discharge: 2018-07-24 | Disposition: A | Discharge status: Home / Self Care | Payer: Medicare HMO | Attending: Urology | Admitting: Urology

## 2018-07-24 ENCOUNTER — Encounter (HOSPITAL_COMMUNITY): Payer: Self-pay

## 2018-07-24 ENCOUNTER — Ambulatory Visit (HOSPITAL_COMMUNITY): Payer: Medicare HMO | Admitting: Physician Assistant

## 2018-07-24 DIAGNOSIS — Z791 Long term (current) use of non-steroidal anti-inflammatories (NSAID): Secondary | ICD-10-CM | POA: Insufficient documentation

## 2018-07-24 DIAGNOSIS — E039 Hypothyroidism, unspecified: Secondary | ICD-10-CM | POA: Insufficient documentation

## 2018-07-24 DIAGNOSIS — H409 Unspecified glaucoma: Secondary | ICD-10-CM | POA: Insufficient documentation

## 2018-07-24 DIAGNOSIS — Z87891 Personal history of nicotine dependence: Secondary | ICD-10-CM | POA: Insufficient documentation

## 2018-07-24 DIAGNOSIS — Z905 Acquired absence of kidney: Secondary | ICD-10-CM | POA: Diagnosis not present

## 2018-07-24 DIAGNOSIS — N401 Enlarged prostate with lower urinary tract symptoms: Secondary | ICD-10-CM

## 2018-07-24 DIAGNOSIS — Z85828 Personal history of other malignant neoplasm of skin: Secondary | ICD-10-CM | POA: Diagnosis not present

## 2018-07-24 DIAGNOSIS — N400 Enlarged prostate without lower urinary tract symptoms: Secondary | ICD-10-CM | POA: Insufficient documentation

## 2018-07-24 DIAGNOSIS — N189 Chronic kidney disease, unspecified: Secondary | ICD-10-CM | POA: Insufficient documentation

## 2018-07-24 DIAGNOSIS — Z85528 Personal history of other malignant neoplasm of kidney: Secondary | ICD-10-CM | POA: Insufficient documentation

## 2018-07-24 DIAGNOSIS — Z79899 Other long term (current) drug therapy: Secondary | ICD-10-CM | POA: Insufficient documentation

## 2018-07-24 DIAGNOSIS — Z8673 Personal history of transient ischemic attack (TIA), and cerebral infarction without residual deficits: Secondary | ICD-10-CM | POA: Diagnosis not present

## 2018-07-24 DIAGNOSIS — I129 Hypertensive chronic kidney disease with stage 1 through stage 4 chronic kidney disease, or unspecified chronic kidney disease: Secondary | ICD-10-CM | POA: Diagnosis not present

## 2018-07-24 DIAGNOSIS — E785 Hyperlipidemia, unspecified: Secondary | ICD-10-CM | POA: Insufficient documentation

## 2018-07-24 DIAGNOSIS — Z7982 Long term (current) use of aspirin: Secondary | ICD-10-CM | POA: Insufficient documentation

## 2018-07-24 DIAGNOSIS — Z79890 Hormone replacement therapy (postmenopausal): Secondary | ICD-10-CM | POA: Insufficient documentation

## 2018-07-24 DIAGNOSIS — N211 Calculus in urethra: Secondary | ICD-10-CM | POA: Insufficient documentation

## 2018-07-24 DIAGNOSIS — N420 Calculus of prostate: Secondary | ICD-10-CM | POA: Diagnosis not present

## 2018-07-24 DIAGNOSIS — I10 Essential (primary) hypertension: Secondary | ICD-10-CM | POA: Diagnosis not present

## 2018-07-24 DIAGNOSIS — Z950 Presence of cardiac pacemaker: Secondary | ICD-10-CM | POA: Insufficient documentation

## 2018-07-24 DIAGNOSIS — N138 Other obstructive and reflux uropathy: Secondary | ICD-10-CM

## 2018-07-24 DIAGNOSIS — R310 Gross hematuria: Secondary | ICD-10-CM | POA: Diagnosis not present

## 2018-07-24 HISTORY — PX: TRANSURETHRAL RESECTION OF PROSTATE: SHX73

## 2018-07-24 SURGERY — TURP (TRANSURETHRAL RESECTION OF PROSTATE)
Anesthesia: General

## 2018-07-24 MED ORDER — ASPIRIN EC 81 MG PO TBEC: 81.0000 mg | DELAYED_RELEASE_TABLET | Freq: Every day | ORAL | Status: DC

## 2018-07-24 MED ORDER — FENTANYL CITRATE (PF) 100 MCG/2ML IJ SOLN: 25.0000 ug | INTRAMUSCULAR | Status: DC | PRN

## 2018-07-24 MED ORDER — MEPERIDINE HCL 50 MG/ML IJ SOLN: 6.2500 mg | INTRAMUSCULAR | Status: DC | PRN

## 2018-07-24 MED ORDER — SODIUM CHLORIDE 0.9 % IR SOLN
Status: DC | PRN
  Administered 2018-07-24: 12000 mL

## 2018-07-24 MED ORDER — PROPOFOL 10 MG/ML IV BOLUS
INTRAVENOUS | Status: AC
  Filled 2018-07-24: qty 20

## 2018-07-24 MED ORDER — LIDOCAINE 2% (20 MG/ML) 5 ML SYRINGE
INTRAMUSCULAR | Status: AC
  Filled 2018-07-24: qty 5

## 2018-07-24 MED ORDER — LACTATED RINGERS IV SOLN
INTRAVENOUS | Status: DC
  Administered 2018-07-24: 06:00:00 via INTRAVENOUS

## 2018-07-24 MED ORDER — FENTANYL CITRATE (PF) 100 MCG/2ML IJ SOLN
INTRAMUSCULAR | Status: DC | PRN
  Administered 2018-07-24: 50 ug via INTRAVENOUS

## 2018-07-24 MED ORDER — ONDANSETRON HCL 4 MG/2ML IJ SOLN
INTRAMUSCULAR | Status: AC
  Filled 2018-07-24: qty 2

## 2018-07-24 MED ORDER — CEFAZOLIN SODIUM-DEXTROSE 2-4 GM/100ML-% IV SOLN
2.0000 g | Freq: Once | INTRAVENOUS | Status: AC
  Administered 2018-07-24: 2 g via INTRAVENOUS
  Filled 2018-07-24: qty 100

## 2018-07-24 MED ORDER — FENTANYL CITRATE (PF) 100 MCG/2ML IJ SOLN
INTRAMUSCULAR | Status: AC
  Filled 2018-07-24: qty 2

## 2018-07-24 MED ORDER — CEPHALEXIN 500 MG PO CAPS: 500.0000 mg | ORAL_CAPSULE | Freq: Every day | ORAL | 0 refills | Status: AC

## 2018-07-24 MED ORDER — DEXAMETHASONE SODIUM PHOSPHATE 10 MG/ML IJ SOLN
INTRAMUSCULAR | Status: AC
  Filled 2018-07-24: qty 1

## 2018-07-24 MED ORDER — LIDOCAINE 2% (20 MG/ML) 5 ML SYRINGE
INTRAMUSCULAR | Status: DC | PRN
  Administered 2018-07-24: 80 mg via INTRAVENOUS

## 2018-07-24 MED ORDER — 0.9 % SODIUM CHLORIDE (POUR BTL) OPTIME
TOPICAL | Status: DC | PRN
  Administered 2018-07-24: 1000 mL

## 2018-07-24 MED ORDER — ONDANSETRON HCL 4 MG/2ML IJ SOLN
INTRAMUSCULAR | Status: DC | PRN
  Administered 2018-07-24: 4 mg via INTRAVENOUS

## 2018-07-24 MED ORDER — PROPOFOL 10 MG/ML IV BOLUS
INTRAVENOUS | Status: DC | PRN
  Administered 2018-07-24: 150 mg via INTRAVENOUS

## 2018-07-24 MED ORDER — DEXAMETHASONE SODIUM PHOSPHATE 10 MG/ML IJ SOLN
INTRAMUSCULAR | Status: DC | PRN
  Administered 2018-07-24: 8 mg via INTRAVENOUS

## 2018-07-24 SURGICAL SUPPLY — 17 items
BAG URINE DRAINAGE (UROLOGICAL SUPPLIES) ×2 IMPLANT
BAG URO CATCHER STRL LF (MISCELLANEOUS) ×2 IMPLANT
CATH FOLEY 2WAY 5CC 20FR (CATHETERS) ×1 IMPLANT
COVER WAND RF STERILE (DRAPES) IMPLANT
ELECT REM PT RETURN 15FT ADLT (MISCELLANEOUS) ×1 IMPLANT
EVACUATOR MICROVAS BLADDER (UROLOGICAL SUPPLIES) ×1 IMPLANT
GLOVE BIOGEL M STRL SZ7.5 (GLOVE) ×2 IMPLANT
GOWN STRL REUS W/TWL XL LVL3 (GOWN DISPOSABLE) ×2 IMPLANT
HOLDER FOLEY CATH W/STRAP (MISCELLANEOUS) ×1 IMPLANT
KIT TURNOVER KIT A (KITS) ×1 IMPLANT
LOOP CUT BIPOLAR 24F LRG (ELECTROSURGICAL) ×1 IMPLANT
MANIFOLD NEPTUNE II (INSTRUMENTS) ×2 IMPLANT
PACK CYSTO (CUSTOM PROCEDURE TRAY) ×2 IMPLANT
SYR 30ML LL (SYRINGE) IMPLANT
SYRINGE IRR TOOMEY STRL 70CC (SYRINGE) ×2 IMPLANT
TUBING CONNECTING 10 (TUBING) ×2 IMPLANT
TUBING UROLOGY SET (TUBING) ×2 IMPLANT

## 2018-07-24 NOTE — Anesthesia Postprocedure Evaluation (Signed)
Anesthesia Post Note  Patient: Joseph Hanna  Procedure(s) Performed: TRANSURETHRAL RESECTION OF THE PROSTATE (TURP) (N/A )     Patient location during evaluation: PACU Anesthesia Type: General Level of consciousness: awake and alert Pain management: pain level controlled Vital Signs Assessment: post-procedure vital signs reviewed and stable Respiratory status: spontaneous breathing, nonlabored ventilation, respiratory function stable and patient connected to nasal cannula oxygen Cardiovascular status: blood pressure returned to baseline and stable Postop Assessment: no apparent nausea or vomiting Anesthetic complications: no    Last Vitals:  Vitals:   07/24/18 0945 07/24/18 0955  BP: (!) 155/78 (!) 176/76  Pulse: 60 63  Resp: (!) 22 20  Temp: 36.8 C (!) 36.4 C  SpO2: 94% 99%    Last Pain:  Vitals:   07/24/18 0955  TempSrc:   PainSc: 0-No pain                 Lesbia Ottaway

## 2018-07-24 NOTE — Transfer of Care (Signed)
Immediate Anesthesia Transfer of Care Note  Patient: Joseph Hanna  Procedure(s) Performed: TRANSURETHRAL RESECTION OF THE PROSTATE (TURP) (N/A )  Patient Location: PACU  Anesthesia Type:General  Level of Consciousness: awake, alert , oriented and patient cooperative  Airway & Oxygen Therapy: Patient Spontanous Breathing and Patient connected to face mask oxygen  Post-op Assessment: Report given to RN, Post -op Vital signs reviewed and stable and Patient moving all extremities  Post vital signs: Reviewed and stable  Last Vitals:  Vitals Value Taken Time  BP 145/53 07/24/2018  8:47 AM  Temp    Pulse 60 07/24/2018  8:50 AM  Resp 12 07/24/2018  8:50 AM  SpO2 100 % 07/24/2018  8:50 AM  Vitals shown include unvalidated device data.  Last Pain:  Vitals:   07/24/18 0553  TempSrc:   PainSc: 0-No pain      Patients Stated Pain Goal: 3 (50/93/26 7124)  Complications: No apparent anesthesia complications

## 2018-07-24 NOTE — H&P (Signed)
H&P  Chief Complaint: Gross hematuria  History of Present Illness: [name] is an 85 year old male with a history of BPH.  He had TURP in the past as well as greenlight photo vaporization of the prostate.  He has had recent gross hematuria.  Cystoscopy in the office revealed some prostatic regrowth and prostatic stones.  He was brought today for transurethral resection of the prostate.  He has been well without dysuria, fever cough or colds.  He has been maintained on finasteride.  He is known to have a pacemaker, solitary right kidney as well as thrombocytopenia.  His platelet count yesterday was 82,000.  No gross hematuria today.  Past Medical History:  Diagnosis Date  . Arthritis   . BPH (benign prostatic hypertrophy)   . Cancer ([place])    kidney , skin cancer   . CKD (chronic kidney disease)   . Clavicle fracture    Right  . ED (erectile dysfunction) of organic origin   . Fatty liver 2020   [place]  . Gallstones 2020   [place]  . GERD (gastroesophageal reflux disease)   . Glaucoma    bilateral --  right uses rx drops and left eye trabeculectomy 2011  . Grade I diastolic dysfunction 35/70/1779   Noted on ECHO  . History of bradycardia   . History of gastroesophageal reflux (GERD)   . History of left bundle branch block (LBBB) 05/05/2018   Noted on EKG  . History of renal cell cancer    S/P  LEFT NEPHRECTOMY  2003--  no recurrence  . History of ulcerative colitis    in remission  . Hyperlipidemia   . Hypothyroidism   . Keratosis, actinic   . 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 LVH (left ventricular hypertrophy) 04/21/2018   Mode, noted on ECHO  . Mobitz II 05/05/2018   Noted on EKG  . Presence of permanent cardiac pacemaker   . Rosacea   . Thrombocytopenia ([place])   . TIA (transient ischemic attack) 06/20/2015  . TIA (transient ischemic attack)    2017    Past Surgical History:  Procedure Laterality Date  . bilateral knee  meniscus surgery     . CATARACT EXTRACTION W/ INTRAOCULAR LENS  IMPLANT, BILATERAL    . COLONOSCOPY  FEB 2015  . GREEN LIGHT LASER TURP (TRANSURETHRAL RESECTION OF PROSTATE N/A 08/10/2013   Procedure: GREEN LIGHT LASER TURP (TRANSURETHRAL RESECTION OF PROSTATE;  Surgeon: [name], MD;  Location: [facility];  Service: Urology;  Laterality: N/A;  . [place]  . MOHS SURGERY     x 2  . NEPHRECTOMY Left 2003  . ORIF CLAVICULAR FRACTURE  12/03/2011   Procedure: OPEN REDUCTION INTERNAL FIXATION (ORIF) CLAVICULAR FRACTURE;  Surgeon: [name], MD;  Location: [facility];  Service: Orthopedics;  Laterality: Right;  . PACEMAKER IMPLANT N/A 05/07/2018   Procedure: PACEMAKER IMPLANT;  Surgeon: Thompson Grayer, MD;  Location: [facility];  Service: Cardiovascular;  Laterality: N/A;  . right shoulder surgery      done at [facility]   . SHOULDER ARTHROSCOPY Left LEFT  2012  . TONSILLECTOMY  as child  . TRABECULECTOMY  2011   left eye (glaucoma)    Home Medications:  Medications Prior to Admission  Medication Sig Dispense Refill Last Dose  . azaTHIOprine (IMURAN) 50 MG tablet Take 75 mg by mouth every morning. Takes 1  1/2  TABS   07/23/2018 at Unknown time  . Cholecalciferol (VITAMIN D3) 1000 UNITS CAPS Take 2 capsules by mouth daily.    07/23/2018 at Unknown time  . Coenzyme Q10 (COQ10) 200 MG CAPS Take 1 capsule by mouth daily.   07/23/2018 at Unknown time  . dorzolamide-timolol (COSOPT) 22.3-6.8 MG/ML ophthalmic solution Place 1 drop into the right eye 3 (three) times daily.   07/24/2018 at 0400  . fish oil-omega-3 fatty acids 1000 MG capsule Take 1 g by mouth daily.    07/23/2018 at Unknown time  . Ginkgo Biloba 40 MG TABS Take 120 mg by mouth daily.    07/23/2018 at Unknown time  . levothyroxine (SYNTHROID, LEVOTHROID) 50 MCG tablet Take 50-100 mcg by mouth daily before breakfast. Take 83mcg everyday except Wednesday and Sunday take  170mcg.   07/24/2018 at 0400  . mesalamine (APRISO) 0.375 G 24 hr capsule Take 2,250 mg by mouth daily. TAKES 6 TABS DAILY--  TOTAL 2250MG    07/23/2018 at Unknown time  . mesalamine (CANASA) 1000 MG suppository Place 1,000 mg rectally at bedtime.   Past Week at Unknown time  . Netarsudil-Latanoprost (ROCKLATAN) 0.02-0.005 % SOLN Apply 1 drop to eye at bedtime.   07/23/2018 at Unknown time  . niacin (SLO-NIACIN) 500 MG tablet Take 500 mg by mouth at bedtime.   07/23/2018 at Unknown time  . simvastatin (ZOCOR) 20 MG tablet Take 10 mg by mouth every other day. TAKES QHS   Past Week at Unknown time  . urea (CARMOL) 20 % cream Apply topically as needed.   07/23/2018 at Unknown time  . aspirin EC 81 MG tablet Take 81 mg by mouth daily.   07/16/2018  . fluoruracil (CARAC) 0.5 % cream Apply 1 application topically 2 (two) times daily.    More than a month at Unknown time  . metroNIDAZOLE (METROGEL) 0.75 % gel Apply 1 application topically 2 (two) times daily as needed (Rosacea).    More than a month at Unknown time  . Multiple Vitamins-Minerals (EQ COMPLETE MULTIVIT ADULT 50+ PO) Take 1 tablet by mouth daily.   07/16/2018   Allergies:  Allergies  Allergen Reactions  . Lipitor [Atorvastatin] Other (See Comments)    "severe muscle aches"  . Sporanox [Itraconazole] Rash    Family History  Problem Relation Age of Onset  . Breast cancer Mother   . Emphysema Father   . Alzheimer's disease Brother    Social History:  reports that he quit smoking about 64 years ago. His smoking use included cigarettes. He quit after 6.00 years of use. He has never used smokeless tobacco. He reports current alcohol use of about 7.0 standard drinks of alcohol per week. He reports that he does not use drugs.  ROS: A complete review of systems was performed.  All systems are negative except for pertinent findings as noted. Review of Systems  All other systems reviewed and are negative.    Physical Exam:  Vital signs in last  24 hours: Temp:  [97.8 F (36.6 C)-98.1 F (36.7 C)] 98.1 F (36.7 C) (03/13 0530) Pulse Rate:  [59-72] 59 (03/13 0530) Resp:  [16-18] 16 (03/13 0530) BP: (142-159)/(65-71) 159/71 (03/13 0530) SpO2:  [98 %-100 %] 100 % (03/13 0530) Weight:  [84.4 kg] 84.4 kg (03/13 0553) General:  Alert and oriented, No acute distress HEENT: Normocephalic, atraumatic Cardiovascular: Regular rate and rhythm Lungs: Regular rate and effort Abdomen: Soft, nontender, nondistended, no abdominal masses Back: No CVA tenderness Extremities: No  edema Neurologic: Grossly intact  Laboratory Data:  Results for orders placed or performed in visit on 07/23/18 (from the past 24 hour(s))  CBC with Differential ([place] Only)     Status: Abnormal   Collection Time: 07/23/18 10:46 AM  Result Value Ref Range   WBC Count 6.7 4.0 - 10.5 K/uL   RBC 4.47 4.22 - 5.81 MIL/uL   Hemoglobin 13.7 13.0 - 17.0 g/dL   HCT 43.0 39.0 - 52.0 %   MCV 96.2 80.0 - 100.0 fL   MCH 30.6 26.0 - 34.0 pg   MCHC 31.9 30.0 - 36.0 g/dL   RDW 14.3 11.5 - 15.5 %   Platelet Count 82 (L) 150 - 400 K/uL   nRBC 0.0 0.0 - 0.2 %   Neutrophils Relative % 57 %   Neutro Abs 3.8 1.7 - 7.7 K/uL   Lymphocytes Relative 17 %   Lymphs Abs 1.1 0.7 - 4.0 K/uL   Monocytes Relative 25 %   Monocytes Absolute 1.6 (H) 0.1 - 1.0 K/uL   Eosinophils Relative 0 %   Eosinophils Absolute 0.0 0.0 - 0.5 K/uL   Basophils Relative 0 %   Basophils Absolute 0.0 0.0 - 0.1 K/uL   Immature Granulocytes 1 %   Abs Immature Granulocytes 0.05 0.00 - 0.07 K/uL  CMP ([facility] only)     Status: Abnormal   Collection Time: 07/23/18 10:46 AM  Result Value Ref Range   Sodium 141 135 - 145 mmol/L   Potassium 4.1 3.5 - 5.1 mmol/L   Chloride 106 98 - 111 mmol/L   CO2 29 22 - 32 mmol/L   Glucose, Bld 120 (H) 70 - 99 mg/dL   BUN 22 8 - 23 mg/dL   Creatinine 1.33 (H) 0.61 - 1.24 mg/dL   Calcium 9.8 8.9 - 10.3 mg/dL   Total Protein 7.3 6.5 - 8.1 g/dL   Albumin 4.8  3.5 - 5.0 g/dL   AST 15 15 - 41 U/L   ALT 15 0 - 44 U/L   Alkaline Phosphatase 46 38 - 126 U/L   Total Bilirubin 0.6 0.3 - 1.2 mg/dL   GFR, Est Non Af Am 49 (L) >60 mL/min   GFR, Est AFR Am 57 (L) >60 mL/min   Anion gap 6 5 - 15  Save Smear (SSMR)     Status: None   Collection Time: 07/23/18 10:46 AM  Result Value Ref Range   Smear Review SMEAR STAINED AND AVAILABLE FOR REVIEW   Lactate dehydrogenase     Status: Abnormal   Collection Time: 07/23/18 10:46 AM  Result Value Ref Range   LDH 215 (H) 98 - 192 U/L   No results found for this or any previous visit (from the past 240 hour(s)). Creatinine: Recent Labs    07/21/18 1031 07/23/18 1046  CREATININE 1.13 1.33*    Impression/Assessment:  BPH, gross hematuria  Plan:  Transurethral resection of prostate-patient with prior laser vaporization with took care of the bulk of the prostate.  He has some regrowth and irregular channel and fusion of the lobes as well as some prostatic stones.  We will clean those out today.  I discussed with the patient and his wife the nature, potential benefits, risks and alternatives to transurethral resection of the prostate, including side effects of the proposed treatment, the likelihood of the patient achieving the goals of the procedure, and any potential problems that might occur during the procedure or recuperation.  Given his prior prostate surgery  I do not think we will have to do a lot of resections of bleeding risk would be lower.  I also discussed risk of stricture and incontinence among others.  All questions answered. Patient elects to proceed.     Festus Aloe 07/24/2018, 7:34 AM

## 2018-07-24 NOTE — Op Note (Signed)
Preoperative diagnosis: BPH, gross hematuria, prostatic stone Postoperative diagnosis: Same  Procedure: Transurethral resection of prostate  Surgeon: Joseph Hanna  Anesthesia: General  Indication for procedure: Joseph Hanna is an 85 year old male with a history of BPH and greenlight photo vaporization of the prostate.  He is had recent gross hematuria and was noted to have some fusion of the apical lobes and prostatic urethral stones on recent office cystoscopy.  He was brought for transurethral resection of prostate.  Findings: On cystoscopy the urethra was unremarkable, the prostate was obstructed by some fusion of the lateral lobes especially in the mid prostate and apex going under these there was about an 8 mm prostatic urethral stone and above this about a 1 cm prostatic urethral stone and fixed to the posterior mucosa.  The bladder neck was widely patent and well resected.  The bladder was moderately trabeculated and the mucosa appeared normal.  There was no stone or foreign body in the bladder.  The ureteral orifice ease were identified and noted to have clear efflux.   Description of procedure: After consent was obtained patient brought to the operating room after adequate anesthesia was placed in lithotomy position and prepped and draped in usual sterile fashion.  A timeout was performed to confirm the patient and procedure.  The cystoscope was passed per urethra and the bladder inspected.  The scope was removed and then I passed the continuous flow sheath with the visual obturator into the bladder.  The loop and the handle were then inserted.  Passage of the scope and split the lobes of the prostate apart and re-created the channel completely.  The prostatic stones were scraped off and pushed into the bladder.  Starting at the bladder neck the left lobe of the prostate was resected from bladder neck anterior to posterior down to the verumontanum.  This re-created the channel.  I did not need to  resect posteriorly.  The right lobe was then resected bladder neck down to verumontanum anterior to posterior.  This created an excellent channel.  There was minimal bleeding throughout the case.  All chips were evacuated.  The bladder neck did not require resection.  A small amount of residual tissue at the apex and anteriorly were resected.  Hemostasis was excellent at low pressure.  No resection was carried out distal to the verumontanum.  A final inspection of the bladder noted to be normal without chips.  The scope was removed and a 20 [place] coud catheter was placed left to gravity drainage.  Drainage was clear and irrigation was clear.  He was awakened and taken to the recovery room in stable condition.  Complications: None  Blood loss: 50 mL  Specimens to pathology: TURP chips  Drains: 20 French coud catheter  Disposition: Patient stable to PACU

## 2018-07-24 NOTE — Anesthesia Procedure Notes (Signed)
Procedure Name: LMA Insertion Date/Time: 07/24/2018 7:48 AM Performed by: Victoriano Lain, CRNA Pre-anesthesia Checklist: Patient identified, Emergency Drugs available, Suction available, Patient being monitored and Timeout performed Patient Re-evaluated:Patient Re-evaluated prior to induction Oxygen Delivery Method: Circle system utilized Preoxygenation: Pre-oxygenation with 100% oxygen Induction Type: IV induction LMA: LMA with gastric port inserted LMA Size: 4.0 Tube type: Oral Number of attempts: 1 Placement Confirmation: positive ETCO2 and breath sounds checked- equal and bilateral Tube secured with: Tape Dental Injury: Teeth and Oropharynx as per pre-operative assessment

## 2018-07-24 NOTE — Discharge Instructions (Signed)
Indwelling Urinary Catheter Care, Adult An indwelling urinary catheter is a thin tube that is put into your bladder. The tube helps to drain pee (urine) out of your body. The tube goes in through your urethra. Your urethra is where pee comes out of your body. Your pee will come out through the catheter, then it will go into a bag (drainage bag). Take good care of your catheter so it will work well. How to wear your catheter and bag Supplies needed  Sticky tape (adhesive tape) or a leg strap.  Alcohol wipe or soap and water (if you use tape).  A clean towel (if you use tape).  Large overnight bag.  Smaller bag (leg bag). Wearing your catheter Attach your catheter to your leg with tape or a leg strap.  Make sure the catheter is not pulled tight.  If a leg strap gets wet, take it off and put on a dry strap.  If you use tape to hold the bag on your leg: 1. Use an alcohol wipe or soap and water to wash your skin where the tape made it sticky before. 2. Use a clean towel to pat-dry that skin. 3. Use new tape to make the bag stay on your leg. Wearing your bags You should have been given a large overnight bag.  You may wear the overnight bag in the day or night.  Always have the overnight bag lower than your bladder.  Do not let the bag touch the floor.  Before you go to sleep, put a clean plastic bag in a wastebasket. Then hang the overnight bag inside the wastebasket. You should also have a smaller leg bag that fits under your clothes.  Always wear the leg bag below your knee.  Do not wear your leg bag at night. How to care for your skin and catheter Supplies needed  A clean washcloth.  Water and mild soap.  A clean towel. Caring for your skin and catheter      Clean the skin around your catheter every day: ? Wash your hands with soap and water. ? Wet a clean washcloth in warm water and mild soap. ? Clean the skin around your urethra. ? If you are male: ? Gently  spread the folds of skin around your vagina (labia). ? With the washcloth in your other hand, wipe the inner side of your labia on each side. Wipe from front to back. ? If you are male: ? Pull back any skin that covers the end of your penis (foreskin). ? With the washcloth in your other hand, wipe your penis in small circles. Start wiping at the tip of your penis, then move away from the catheter. ? With your free hand, hold the catheter close to where it goes into your body. ? Keep holding the catheter during cleaning so it does not get pulled out. ? With the washcloth in your other hand, clean the catheter. ? Only wipe downward on the catheter. ? Do not wipe upward toward your body. Doing this may push germs into your urethra and cause infection. ? Use a clean towel to pat-dry the catheter and the skin around it. Make sure to wipe off all soap. ? Wash your hands with soap and water.  Shower every day. Do not take baths.  Do not use cream, ointment, or lotion on the area where the catheter goes into your body, unless your doctor tells you to.  Do not use powders, sprays, or lotions  on your genital area.  Check your skin around the catheter every day for signs of infection. Check for: ? Redness, swelling, or pain. ? Fluid or blood. ? Warmth. ? Pus or a bad smell. How to empty the bag Supplies needed  Rubbing alcohol.  Gauze pad or cotton ball.  Tape or a leg strap. Emptying the bag Pour the pee out of your bag when it is ?- full, or at least 2-3 times a day. Do this for your overnight bag and your leg bag. 1. Wash your hands with soap and water. 2. Separate (detach) the bag from your leg. 3. Hold the bag over the toilet or a clean pail. Keep the bag lower than your hips and bladder. This is so the pee (urine) does not go back into the tube. 4. Open the pour spout. It is at the bottom of the bag. 5. Empty the pee into the toilet or pail. Do not let the pour spout touch any  surface. 6. Put rubbing alcohol on a gauze pad or cotton ball. 7. Use the gauze pad or cotton ball to clean the pour spout. 8. Close the pour spout. 9. Attach the bag to your leg with tape or a leg strap. 10. Wash your hands with soap and water. Follow instructions for cleaning the drainage bag:  From the product maker.  As told by your doctor. How to change the bag Supplies needed  Alcohol wipes.  A clean bag.  Tape or a leg strap. Changing the bag Replace your bag with a clean bag once a month. If it starts to leak, smell bad, or look dirty, change it sooner. 1. Wash your hands with soap and water. 2. Separate the dirty bag from your leg. 3. Pinch the catheter with your fingers so that pee does not spill out. 4. Separate the catheter tube from the bag tube where these tubes connect (at the connection valve). Do not let the tubes touch any surface. 5. Clean the end of the catheter tube with an alcohol wipe. Use a different alcohol wipe to clean the end of the bag tube. 6. Connect the catheter tube to the tube of the clean bag. 7. Attach the clean bag to your leg with tape or a leg strap. Do not make the bag tight on your leg. 8. Wash your hands with soap and water. General rules   Never pull on your catheter. Never try to take it out. Doing that can hurt you.  Always wash your hands before and after you touch your catheter or bag. Use a mild, fragrance-free soap. If you do not have soap and water, use hand sanitizer.  Always make sure there are no twists or bends (kinks) in the catheter tube.  Always make sure there are no leaks in the catheter or bag.  Drink enough fluid to keep your pee pale yellow.  Do not take baths, swim, or use a hot tub.  If you are male, wipe from front to back after you poop (have a bowel movement). Contact a doctor if:  Your pee is cloudy.  Your pee smells worse than usual.  Your catheter gets clogged.  Your catheter leaks.  Your  bladder feels full. Get help right away if:  You have redness, swelling, or pain where the catheter goes into your body.  You have fluid, blood, pus, or a bad smell coming from the area where the catheter goes into your body.  Your skin feels warm  where the catheter goes into your body. °· You have a fever. °· You have pain in your: °? Belly (abdomen). °? Legs. °? Lower back. °? Bladder. °· You see blood in the catheter. °· Your pee is pink or red. °· You feel sick to your stomach (nauseous). °· You throw up (vomit). °· You have chills. °· Your pee is not draining into the bag. °· Your catheter gets pulled out. °Summary °· An indwelling urinary catheter is a thin tube that is placed into the bladder to help drain pee (urine) out of the body. °· The catheter is placed into the part of the body that drains pee from the bladder (urethra). °· Taking good care of your catheter will keep it working properly and help prevent problems. °· Always wash your hands before and after touching your catheter or bag. °· Never pull on your catheter or try to take it out. °This information is not intended to replace advice given to you by your health care provider. Make sure you discuss any questions you have with your health care provider. °Document Released: 08/24/2012 Document Revised: 10/20/2017 Document Reviewed: 12/13/2016 °Elsevier Interactive Patient Education © 2019 [employer] °Transurethral Resection of the Prostate, Care After °Refer to this sheet in the next few weeks. These instructions provide you with information about caring for yourself after your procedure. Your health care provider may also give you more specific instructions. Your treatment has been planned according to current medical practices, but problems sometimes occur. Call your health care provider if you have any problems or questions after your procedure. °What can I expect after the procedure? °After the procedure, it is common to have: °· Mild  pain in your lower abdomen. °· Soreness or mild discomfort in your penis from having the catheter inserted during the procedure. °· A feeling of urgency when you need to urinate. °· A small amount of blood in your urine. You may notice some small blood clots in your urine. These are normal. °Follow these instructions at home: °Medicines ° °· Take over-the-counter and prescription medicines only as told by your health care provider. °· Do not drive or operate heavy machinery while taking prescription pain medicine. °· Do not drive for 24 hours if you received a sedative. °· If you were prescribed antibiotic medicine, take it as told by your health care provider. Do not stop taking the antibiotic even if you start to feel better. °Activity °· Return to your normal activities as told by your health care provider. Ask your health care provider what activities are safe for you. °· Do not lift anything that is heavier than 10 lb (4.5 kg) for 3 weeks after your procedure, or as long as told by your health care provider. °· Avoid intense physical activity for as long as told by your health care provider. °· Avoid sitting for a long time without moving. Get up and move around one or more times every few hours. This helps to prevent blood clots. You may increase your physical activity gradually as you start to feel better. °Lifestyle °· Do not drink alcohol for as long as told by your health care provider. This is especially important if you are taking prescription pain medicines. °· Do not engage in sexual activity until your health care provider says that you can do this. °General instructions °· Do not take baths, swim, or use a hot tub until your health care provider approves. °· Drink enough fluid to keep your urine   clear or pale yellow. °· Urinate as soon as you feel the need to. Do not try to hold your urine for long periods of time. °· If your health care provider approves, you may take a stool softener for 2-3 weeks  to prevent you from straining to have a bowel movement. °· Wear compression stockings as told by your health care provider. These stockings help to prevent blood clots and reduce swelling in your legs. °· Keep all follow-up visits as told by your health care provider. This is important. °Contact a health care provider if: °· You have difficulty urinating. °· You have a fever. °· You have pain that gets worse or does not improve with medicine. °· You have blood in your urine that does not go away after 1 week of resting and drinking more fluids. °· You have swelling in your penis or testicles. °Get help right away if: °· You are unable to urinate. °· You are having more blood clots in your urine instead of fewer. °· You have: °? Large blood clots. °? A lot of blood in your urine. °? Pain in your back or lower abdomen. °? Pain or swelling in your legs. °? Chills and you are shaking. °This information is not intended to replace advice given to you by your health care provider. Make sure you discuss any questions you have with your health care provider. °Document Released: 04/29/2005 Document Revised: 06/12/2016 Document Reviewed: 01/19/2015 °Elsevier Interactive Patient Education © 2019 [employer] ° °

## 2018-07-27 ENCOUNTER — Encounter (HOSPITAL_COMMUNITY): Payer: Self-pay | Admitting: Urology

## 2018-07-29 DIAGNOSIS — N401 Enlarged prostate with lower urinary tract symptoms: Secondary | ICD-10-CM | POA: Diagnosis not present

## 2018-07-29 DIAGNOSIS — N210 Calculus in bladder: Secondary | ICD-10-CM | POA: Diagnosis not present

## 2018-07-29 DIAGNOSIS — R3915 Urgency of urination: Secondary | ICD-10-CM | POA: Diagnosis not present

## 2018-08-03 DIAGNOSIS — R310 Gross hematuria: Secondary | ICD-10-CM | POA: Diagnosis not present

## 2018-08-03 DIAGNOSIS — N401 Enlarged prostate with lower urinary tract symptoms: Secondary | ICD-10-CM | POA: Diagnosis not present

## 2018-08-07 ENCOUNTER — Ambulatory Visit (INDEPENDENT_AMBULATORY_CARE_PROVIDER_SITE_OTHER): Payer: Medicare HMO | Admitting: *Deleted

## 2018-08-07 DIAGNOSIS — I441 Atrioventricular block, second degree: Secondary | ICD-10-CM | POA: Diagnosis not present

## 2018-08-07 LAB — CUP PACEART REMOTE DEVICE CHECK
Battery Remaining Longevity: 63 mo
Battery Remaining Percentage: 95.5 %
Battery Voltage: 2.99 V
Brady Statistic AP VP Percent: 66 %
Brady Statistic AP VS Percent: 1 %
Brady Statistic AS VP Percent: 34 %
Brady Statistic AS VS Percent: 1 %
Brady Statistic RA Percent Paced: 65 %
Brady Statistic RV Percent Paced: 99 %
Date Time Interrogation Session: 20200327092440
Implantable Lead Implant Date: 20191226
Implantable Lead Implant Date: 20191226
Implantable Lead Location: 753859
Implantable Lead Location: 753860
Implantable Pulse Generator Implant Date: 20191226
Lead Channel Impedance Value: 550 Ohm
Lead Channel Impedance Value: 560 Ohm
Lead Channel Pacing Threshold Amplitude: 0.75 V
Lead Channel Pacing Threshold Amplitude: 1.25 V
Lead Channel Pacing Threshold Pulse Width: 0.5 ms
Lead Channel Pacing Threshold Pulse Width: 0.5 ms
Lead Channel Sensing Intrinsic Amplitude: 12 mV
Lead Channel Sensing Intrinsic Amplitude: 2.8 mV
Lead Channel Setting Pacing Amplitude: 3.5 V
Lead Channel Setting Pacing Amplitude: 3.5 V
Lead Channel Setting Pacing Pulse Width: 0.5 ms
Lead Channel Setting Sensing Sensitivity: 2 mV
Pulse Gen Model: 2272
Pulse Gen Serial Number: 9094310

## 2018-08-10 ENCOUNTER — Encounter: Payer: Self-pay | Admitting: Cardiology

## 2018-08-10 NOTE — Progress Notes (Signed)
Remote pacemaker transmission.   

## 2018-08-25 ENCOUNTER — Telehealth: Payer: Self-pay

## 2018-08-25 NOTE — Telephone Encounter (Signed)
Left message regarding appt on 08/28/18.

## 2018-08-26 NOTE — Telephone Encounter (Signed)
New Message   Patient returning your call and he's home now please call him back.

## 2018-08-27 NOTE — Telephone Encounter (Signed)
Spoke with pt regarding appt on 08/28/18. Pt stated he does not have access to MyChart. Pt was advise to check vitals day of appt and keep televisit. Pt stated he can be reached on home phone. Pt questions and concerns were address.

## 2018-08-28 ENCOUNTER — Encounter: Payer: Self-pay | Admitting: Internal Medicine

## 2018-08-28 ENCOUNTER — Other Ambulatory Visit: Payer: Self-pay

## 2018-08-28 ENCOUNTER — Telehealth (INDEPENDENT_AMBULATORY_CARE_PROVIDER_SITE_OTHER): Payer: Medicare HMO | Admitting: Internal Medicine

## 2018-08-28 DIAGNOSIS — G459 Transient cerebral ischemic attack, unspecified: Secondary | ICD-10-CM

## 2018-08-28 DIAGNOSIS — D696 Thrombocytopenia, unspecified: Secondary | ICD-10-CM | POA: Diagnosis not present

## 2018-08-28 DIAGNOSIS — I442 Atrioventricular block, complete: Secondary | ICD-10-CM | POA: Diagnosis not present

## 2018-08-28 DIAGNOSIS — I10 Essential (primary) hypertension: Secondary | ICD-10-CM

## 2018-08-28 DIAGNOSIS — I480 Paroxysmal atrial fibrillation: Secondary | ICD-10-CM

## 2018-08-28 NOTE — Progress Notes (Signed)
Electrophysiology TeleHealth Note   Due to national recommendations of social distancing due to [place] 19, an audio  telehealth visit is felt to be most appropriate for this patient at this time.  Verbal consent obtained from the patient.  He does not have a smart phone and is unable to make his laptop connect to [employer].   Date:  08/28/2018   ID:  Joseph Hanna, DOB 1932/06/17, MRN [id number]  Location: patient's home  Provider location: [address], [place]  Evaluation Performed: Follow-up visit  PCP:  [name], MD  Cardiologist:  Lauree Chandler, MD  Electrophysiologist:  Dr Rayann Heman  Chief Complaint:  afib  History of Present Illness:    Joseph Hanna is a 85 y.o. male who presents via audio conferencing for a telehealth visit today.  Since his pacemaker implant, the patient reports doing very well.  He is pleased with his current state. Today, he denies symptoms of palpitations, chest pain, shortness of breath,  lower extremity edema, dizziness, presyncope, or syncope.  The patient is otherwise without complaint today.  The patient denies symptoms of fevers, chills, cough, or new SOB worrisome for COVID 19.  He has had afib (<1%) by device interrogation, longest episode 22 min for which he is unaware.  Past Medical History:  Diagnosis Date  . Arthritis   . BPH (benign prostatic hypertrophy)   . Cancer ([place])    kidney , skin cancer   . CKD (chronic kidney disease)   . Clavicle fracture    Right  . ED (erectile dysfunction) of organic origin   . Fatty liver 2020   [place]  . Gallstones 2020   [place]  . GERD (gastroesophageal reflux disease)   . Glaucoma    bilateral --  right uses rx drops and left eye trabeculectomy 2011  . Grade I diastolic dysfunction 27/51/7001   Noted on ECHO  . History of bradycardia   . History of gastroesophageal reflux (GERD)   . History of left bundle branch block (LBBB) 05/05/2018   Noted on EKG  . History of renal cell  cancer    S/P  LEFT NEPHRECTOMY  2003--  no recurrence  . History of ulcerative colitis    in remission  . Hyperlipidemia   . Hypothyroidism   . Keratosis, actinic   . 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 LVH (left ventricular hypertrophy) 04/21/2018   Mode, noted on ECHO  . Mobitz II 05/05/2018   Noted on EKG  . Presence of permanent cardiac pacemaker   . Rosacea   . Thrombocytopenia ([place])   . TIA (transient ischemic attack) 06/20/2015  . TIA (transient ischemic attack)    2017     Past Surgical History:  Procedure Laterality Date  . bilateral knee meniscus surgery     . CATARACT EXTRACTION W/ INTRAOCULAR LENS  IMPLANT, BILATERAL    . COLONOSCOPY  FEB 2015  . GREEN LIGHT LASER TURP (TRANSURETHRAL RESECTION OF PROSTATE N/A 08/10/2013   Procedure: GREEN LIGHT LASER TURP (TRANSURETHRAL RESECTION OF PROSTATE;  Surgeon: [name], MD;  Location: [facility];  Service: Urology;  Laterality: N/A;  . [place]  . MOHS SURGERY     x 2  . NEPHRECTOMY Left 2003  . ORIF CLAVICULAR FRACTURE  12/03/2011   Procedure: OPEN REDUCTION INTERNAL FIXATION (ORIF) CLAVICULAR FRACTURE;  Surgeon: [name], MD;  Location: [facility];  Service: Orthopedics;  Laterality: Right;  . PACEMAKER IMPLANT N/A 05/07/2018    [employer] Jude [employer] MRI conditional  dual-chamber pacemaker by Dr Rayann Heman for mobitz II second degree AV block  . right shoulder surgery      done at [facility]   . SHOULDER ARTHROSCOPY Left LEFT  2012  . TONSILLECTOMY  as child  . TRABECULECTOMY  2011   left eye (glaucoma)  . TRANSURETHRAL RESECTION OF PROSTATE N/A 07/24/2018   Procedure: TRANSURETHRAL RESECTION OF THE PROSTATE (TURP);  Surgeon: Festus Aloe, MD;  Location: [facility];  Service: Urology;  Laterality: N/A;    Current Outpatient Medications  Medication Sig Dispense Refill  . aspirin EC 81 MG tablet Take  1 tablet (81 mg total) by mouth daily.    [name] azaTHIOprine (IMURAN) 50 MG tablet Take 75 mg by mouth every morning. Takes 1  1/2  TABS    . Cholecalciferol (VITAMIN D3) 1000 UNITS CAPS Take 2 capsules by mouth daily.     . Coenzyme Q10 (COQ10) 200 MG CAPS Take 1 capsule by mouth daily.    . dorzolamide-timolol (COSOPT) 22.3-6.8 MG/ML ophthalmic solution Place 1 drop into the right eye 3 (three) times daily.    . fish oil-omega-3 fatty acids 1000 MG capsule Take 1 g by mouth daily.     . fluoruracil (CARAC) 0.5 % cream Apply 1 application topically 2 (two) times daily.     . Ginkgo Biloba 40 MG TABS Take 120 mg by mouth daily.     . irbesartan (AVAPRO) 75 MG tablet Take 75 mg by mouth daily.    [name] levothyroxine (SYNTHROID, LEVOTHROID) 50 MCG tablet Take 50-100 mcg by mouth daily before breakfast. Take 85mg everyday except Wednesday and Sunday take 1011m.    . mesalamine (APRISO) 0.375 G 24 hr capsule Take 2,250 mg by mouth daily. TAKES 6 TABS DAILY--  TOTAL 2250MG    . mesalamine (CANASA) 1000 MG suppository Place 1,000 mg rectally at bedtime.    . metroNIDAZOLE (METROGEL) 0.75 % gel Apply 1 application topically 2 (two) times daily as needed (Rosacea).     . Multiple Vitamins-Minerals (EQ COMPLETE MULTIVIT ADULT 50+ PO) Take 1 tablet by mouth daily.    . Netarsudil-Latanoprost (ROCKLATAN) 0.02-0.005 % SOLN Apply 1 drop to eye at bedtime.    . niacin (SLO-NIACIN) 500 MG tablet Take 500 mg by mouth at bedtime.    . simvastatin (ZOCOR) 20 MG tablet Take 10 mg by mouth every other day. TAKES QHS    . urea (CARMOL) 20 % cream Apply topically as needed.     No current facility-administered medications for this visit.     Allergies:   Lipitor [atorvastatin] and Sporanox [itraconazole]   Social History:  The patient  reports that he quit smoking about 64 years ago. His smoking use included cigarettes. He quit after 6.00 years of use. He has never used smokeless tobacco. He reports current alcohol use  of about 7.0 standard drinks of alcohol per week. He reports that he does not use drugs.   Family History:  The patient's  family history includes Alzheimer's disease in his brother; Breast cancer in his mother; Emphysema in his father.   ROS:  Please see the history of present illness.   All other systems are personally reviewed and negative.    Exam:    Vital Signs:  BP (!) 118/57   Pulse 61   Well sounding   Labs/Other  Tests and Data Reviewed:    Recent Labs: 07/23/2018: ALT 15; BUN 22; Creatinine 1.33; Hemoglobin 13.7; Platelet Count 82; Potassium 4.1; Sodium 141   Wt Readings from Last 3 Encounters:  07/24/18 186 lb (84.4 kg)  07/23/18 186 lb (84.4 kg)  07/21/18 188 lb 1 oz (85.3 kg)     Other studies personally reviewed: Additional studies/ records that were reviewed today include: device implant, my prior consult note  Review of the above records today demonstrates: as above Prior radiographs: CXR 05/07/18- stable leads, no ptx   Last device remote is reviewed from [facility] PDF dated 08/07/2018 which reveals normal device function, afib >1%, 100% V paced   ASSESSMENT & PLAN:    1. Complete heart block Normal device function Consider bringing back into the office after COVID 19 to reprogram pacing output to preserve battery.  2. Paroxysmal atrial fibrillation AF burden is <1%, longest episode is 22 minutes We may need to consider [place] therapy if afib burden increases.  3. Thrombocytopenia Consider stopping ASA to see if platelets improve  4. Prior TIA I would be in favor of stopping ASA to see if his platelet count improves and then using [place] therapy (eliquis) if platelets improve.  5. HTN Stable No change required today  6. COVID 19 screen The patient denies symptoms of COVID 19 at this time.  The importance of social distancing was discussed today.  Follow-up:  12 months with EP NP Next remote: 10/2018  Current medicines are reviewed at length with the  patient today.   The patient does not have concerns regarding his medicines.  The following changes were made today:  none  Labs/ tests ordered today include:  No orders of the defined types were placed in this encounter.   Patient Risk:  after full review of this patients clinical status, I feel that they are at moderate risk at this time.  Today, I have spent 22 minutes with the patient with telehealth technology discussing pacemaker and afib .    SignedThompson Grayer, MD  08/28/2018 2:21 PM     [address] [place] [address] [phone] (office) [phone] (fax)

## 2018-10-22 ENCOUNTER — Ambulatory Visit: Payer: Medicare HMO | Admitting: Hematology

## 2018-10-22 ENCOUNTER — Other Ambulatory Visit: Payer: Medicare HMO

## 2018-10-28 DIAGNOSIS — H401133 Primary open-angle glaucoma, bilateral, severe stage: Secondary | ICD-10-CM | POA: Diagnosis not present

## 2018-11-05 LAB — CUP PACEART REMOTE DEVICE CHECK
Battery Remaining Longevity: 62 mo
Battery Remaining Percentage: 95.5 %
Battery Voltage: 2.98 V
Brady Statistic AP VP Percent: 71 %
Brady Statistic AP VS Percent: 1.1 %
Brady Statistic AS VP Percent: 27 %
Brady Statistic AS VS Percent: 1 %
Brady Statistic RA Percent Paced: 71 %
Brady Statistic RV Percent Paced: 98 %
Date Time Interrogation Session: 20200625060444
Implantable Lead Implant Date: 20191226
Implantable Lead Implant Date: 20191226
Implantable Lead Location: 753859
Implantable Lead Location: 753860
Implantable Pulse Generator Implant Date: 20191226
Lead Channel Impedance Value: 560 Ohm
Lead Channel Impedance Value: 560 Ohm
Lead Channel Sensing Intrinsic Amplitude: 12 mV
Lead Channel Sensing Intrinsic Amplitude: 3.6 mV
Lead Channel Setting Pacing Amplitude: 3.5 V
Lead Channel Setting Pacing Amplitude: 3.5 V
Lead Channel Setting Pacing Pulse Width: 0.5 ms
Lead Channel Setting Sensing Sensitivity: 2 mV
Pulse Gen Model: 2272
Pulse Gen Serial Number: 9094310

## 2018-11-06 ENCOUNTER — Ambulatory Visit (INDEPENDENT_AMBULATORY_CARE_PROVIDER_SITE_OTHER): Payer: Medicare HMO | Admitting: *Deleted

## 2018-11-06 DIAGNOSIS — I442 Atrioventricular block, complete: Secondary | ICD-10-CM

## 2018-11-10 ENCOUNTER — Encounter: Payer: Medicare HMO | Admitting: *Deleted

## 2018-11-12 ENCOUNTER — Encounter: Payer: Self-pay | Admitting: Cardiology

## 2018-11-12 NOTE — Progress Notes (Signed)
Remote pacemaker transmission.   

## 2018-11-21 ENCOUNTER — Encounter: Payer: Self-pay | Admitting: Cardiology

## 2018-12-07 DIAGNOSIS — R030 Elevated blood-pressure reading, without diagnosis of hypertension: Secondary | ICD-10-CM | POA: Diagnosis not present

## 2018-12-07 DIAGNOSIS — E039 Hypothyroidism, unspecified: Secondary | ICD-10-CM | POA: Diagnosis not present

## 2018-12-07 DIAGNOSIS — R011 Cardiac murmur, unspecified: Secondary | ICD-10-CM | POA: Diagnosis not present

## 2018-12-07 DIAGNOSIS — Z950 Presence of cardiac pacemaker: Secondary | ICD-10-CM | POA: Diagnosis not present

## 2018-12-07 DIAGNOSIS — G459 Transient cerebral ischemic attack, unspecified: Secondary | ICD-10-CM | POA: Diagnosis not present

## 2018-12-07 DIAGNOSIS — N401 Enlarged prostate with lower urinary tract symptoms: Secondary | ICD-10-CM | POA: Diagnosis not present

## 2018-12-07 DIAGNOSIS — N183 Chronic kidney disease, stage 3 (moderate): Secondary | ICD-10-CM | POA: Diagnosis not present

## 2018-12-07 DIAGNOSIS — K5190 Ulcerative colitis, unspecified, without complications: Secondary | ICD-10-CM | POA: Diagnosis not present

## 2018-12-07 DIAGNOSIS — I739 Peripheral vascular disease, unspecified: Secondary | ICD-10-CM | POA: Diagnosis not present

## 2018-12-07 DIAGNOSIS — C649 Malignant neoplasm of unspecified kidney, except renal pelvis: Secondary | ICD-10-CM | POA: Diagnosis not present

## 2018-12-09 ENCOUNTER — Telehealth: Payer: Self-pay | Admitting: *Deleted

## 2018-12-09 DIAGNOSIS — R69 Illness, unspecified: Secondary | ICD-10-CM | POA: Diagnosis not present

## 2018-12-09 NOTE — Telephone Encounter (Signed)
New message   Patient states that he is having an MRI done on foot tomorrow  wants to make sure this okay to have since he has a pacemaker. Please call.

## 2018-12-09 NOTE — Telephone Encounter (Signed)
Informed pt that his device is MRI compatible. The MRI place needs to contact a [employer] rep to have him programmed to a safe mode during the MRI. Pt verbalized understanding. Pt states the last time he talked to Dr. Rayann Heman, Dr. Rayann Heman wanted to see him to reprogram his device once the office opened back up.

## 2018-12-14 DIAGNOSIS — N401 Enlarged prostate with lower urinary tract symptoms: Secondary | ICD-10-CM | POA: Diagnosis not present

## 2018-12-14 DIAGNOSIS — N3946 Mixed incontinence: Secondary | ICD-10-CM | POA: Diagnosis not present

## 2018-12-14 DIAGNOSIS — R972 Elevated prostate specific antigen [PSA]: Secondary | ICD-10-CM | POA: Diagnosis not present

## 2018-12-16 ENCOUNTER — Telehealth: Payer: Self-pay

## 2018-12-16 NOTE — Telephone Encounter (Signed)
   [facility] Pre-operative Risk Assessment    Request for surgical clearance:  1. What type of surgery is being performed? Right Tibialis Anterior Repair   2. When is this surgery scheduled? 12/23/18   3. What type of clearance is required (medical clearance vs. Pharmacy clearance to hold med vs. Both)? Medical  4. Are there any medications that need to be held prior to surgery and how long?  No  5. Practice name and name of physician performing surgery? [facility]  6. What is your office phone number [phone]    7.   What is your office fax number [phone]  8.   Anesthesia type (None, local, MAC, general) ? Not listed   Mady Haagensen 12/16/2018, 4:18 PM  _________________________________________________________________   (provider comments below)

## 2018-12-17 NOTE — Telephone Encounter (Signed)
   Primary Cardiologist: Lauree Chandler, MD  Chart reviewed as part of pre-operative protocol coverage. Patient was contacted 12/17/2018 in reference to pre-operative risk assessment for pending surgery as outlined below.  Joseph Hanna was last seen on 08/28/2018 by Dr. Rayann Heman via a telemedicine visit.  Since that day, Joseph Hanna has done well from a cardiac standpoint. He was walking daily with his wife prior to his ankle injury and denied chest pain or SOB with this activity. He is still working in his garden and performing household chores without anginal complaints.  Therefore, based on ACC/AHA guidelines, the patient would be at acceptable risk for the planned procedure without further cardiovascular testing.   I will route this recommendation to the requesting party via Epic fax function and remove from pre-op pool.  Please call with questions.  Abigail Butts, PA-C 12/17/2018, 7:59 AM

## 2018-12-22 ENCOUNTER — Inpatient Hospital Stay: Payer: Medicare HMO | Attending: Hematology

## 2018-12-22 ENCOUNTER — Encounter: Payer: Self-pay | Admitting: Hematology

## 2018-12-22 ENCOUNTER — Inpatient Hospital Stay (HOSPITAL_BASED_OUTPATIENT_CLINIC_OR_DEPARTMENT_OTHER): Payer: Medicare HMO | Admitting: Hematology

## 2018-12-22 ENCOUNTER — Other Ambulatory Visit: Payer: Self-pay

## 2018-12-22 ENCOUNTER — Telehealth: Payer: Self-pay | Admitting: Hematology

## 2018-12-22 VITALS — BP 137/81 | HR 59 | Temp 97.5°F | Resp 19 | Ht 74.0 in | Wt 180.0 lb

## 2018-12-22 DIAGNOSIS — Z8719 Personal history of other diseases of the digestive system: Secondary | ICD-10-CM | POA: Diagnosis not present

## 2018-12-22 DIAGNOSIS — Z79899 Other long term (current) drug therapy: Secondary | ICD-10-CM | POA: Insufficient documentation

## 2018-12-22 DIAGNOSIS — D696 Thrombocytopenia, unspecified: Secondary | ICD-10-CM | POA: Diagnosis not present

## 2018-12-22 DIAGNOSIS — K5190 Ulcerative colitis, unspecified, without complications: Secondary | ICD-10-CM | POA: Insufficient documentation

## 2018-12-22 DIAGNOSIS — Z7982 Long term (current) use of aspirin: Secondary | ICD-10-CM | POA: Insufficient documentation

## 2018-12-22 DIAGNOSIS — D649 Anemia, unspecified: Secondary | ICD-10-CM | POA: Insufficient documentation

## 2018-12-22 LAB — CMP (CANCER CENTER ONLY)
ALT: 15 U/L (ref 0–44)
AST: 15 U/L (ref 15–41)
Albumin: 4.4 g/dL (ref 3.5–5.0)
Alkaline Phosphatase: 40 U/L (ref 38–126)
Anion gap: 7 (ref 5–15)
BUN: 25 mg/dL — ABNORMAL HIGH (ref 8–23)
CO2: 27 mmol/L (ref 22–32)
Calcium: 9.3 mg/dL (ref 8.9–10.3)
Chloride: 107 mmol/L (ref 98–111)
Creatinine: 1.33 mg/dL — ABNORMAL HIGH (ref 0.61–1.24)
GFR, Est AFR Am: 56 mL/min — ABNORMAL LOW (ref >60)
GFR, Estimated: 49 mL/min — ABNORMAL LOW (ref >60)
Glucose, Bld: 108 mg/dL — ABNORMAL HIGH (ref 70–99)
Potassium: 4.7 mmol/L (ref 3.5–5.1)
Sodium: 141 mmol/L (ref 135–145)
Total Bilirubin: 0.5 mg/dL (ref 0.3–1.2)
Total Protein: 6.7 g/dL (ref 6.5–8.1)

## 2018-12-22 LAB — LACTATE DEHYDROGENASE: LDH: 189 U/L (ref 98–192)

## 2018-12-22 LAB — CBC WITH DIFFERENTIAL (CANCER CENTER ONLY)
Abs Immature Granulocytes: 0.06 10*3/uL (ref 0.00–0.07)
Basophils Absolute: 0 10*3/uL (ref 0.0–0.1)
Basophils Relative: 0 %
Eosinophils Absolute: 0 10*3/uL (ref 0.0–0.5)
Eosinophils Relative: 0 %
HCT: 40.7 % (ref 39.0–52.0)
Hemoglobin: 12.9 g/dL — ABNORMAL LOW (ref 13.0–17.0)
Immature Granulocytes: 1 %
Lymphocytes Relative: 18 %
Lymphs Abs: 1.1 10*3/uL (ref 0.7–4.0)
MCH: 30.2 pg (ref 26.0–34.0)
MCHC: 31.7 g/dL (ref 30.0–36.0)
MCV: 95.3 fL (ref 80.0–100.0)
Monocytes Absolute: 1.5 10*3/uL — ABNORMAL HIGH (ref 0.1–1.0)
Monocytes Relative: 25 %
Neutro Abs: 3.5 10*3/uL (ref 1.7–7.7)
Neutrophils Relative %: 56 %
Platelet Count: 76 10*3/uL — ABNORMAL LOW (ref 150–400)
RBC: 4.27 MIL/uL (ref 4.22–5.81)
RDW: 14.6 % (ref 11.5–15.5)
WBC Count: 6.2 10*3/uL (ref 4.0–10.5)
nRBC: 0 % (ref 0.0–0.2)

## 2018-12-22 NOTE — Progress Notes (Signed)
[facility] OFFICE PROGRESS NOTE  Patient Care Team: [name], MD as PCP - General (Internal Medicine) Burnell Blanks, MD as PCP - Cardiology (Cardiology)  HEME/ONC OVERVIEW: 1. Thrombocytopenia -Likely due to Imuran and underlying liver disease  -Plts 107k on last known CBC in 2017; stable ~80ks since 2020  Coags, nutritional and infectious w/u unremarkable   Increased echogenicity of liver on abd [place]  -On observation   PERTINENT NON-HEM/ONC PROBLEMS: 1. Ulcerative colitis on Imuran since 2002   ASSESSMENT & PLAN:   Thrombocytopenia -Likely multifactorial, including immunosuppression with Imuran and underlying liver disease -Extensive work-up, including serologic studies, was unremarkable; mild increased echogenicity of the liver on abdominal US, suggesting some liver disease  -Clinically, patient denies any symptoms of bleeding or new excess bruising; he has some chronic bruises on the bilateral forearms after being on prednisone x 6 months for UC  -Plts 76k today, stable -Given the overall stable thrombocytopenia, there is no indication for further work-up at this time, such as bone marrow bx  -If patient develops clinically concerning symptoms, worsening thrombocytopenia, or concurrent anemia or leukopenia, then bone marrow biopsy may be beneficial to rule out any underlying disorder, such as MDS   -Patient is counseled to contact the clinic promptly if he develops any new abnormal bleeding or excess bruising  Normocytic anemia -Hgb 12.9 today, slightly low; new -Patient had TURP in 07/2018 and had some persistent hematuria x ~3 weeks -He denies any symptoms of recurrent hematuria, hematochezia, melena, or other bleeding -I have ordered iron profile for the next visit -We will monitor it for now   Ulcerative colitis -Currently well controlled -Imuran dose reduced from '75mg'$  to '50mg'$  daily in 07/2018 due to the COVID outbreak -No symptoms of UC  flare -Continue follow-up with gastroenterology  Orders Placed This Encounter  Procedures  . CBC with Differential (Cancer Center Only)    Standing Status:   Future    Standing Expiration Date:   01/26/2020  . CMP ([facility] only)    Standing Status:   Future    Standing Expiration Date:   01/26/2020  . Save Smear (SSMR)    Standing Status:   Future    Standing Expiration Date:   12/22/2019  . Lactate dehydrogenase    Standing Status:   Future    Standing Expiration Date:   01/26/2020  . Ferritin    Standing Status:   Future    Standing Expiration Date:   01/26/2020  . Iron and TIBC    Standing Status:   Future    Standing Expiration Date:   01/26/2020   All questions were answered. The patient knows to call the clinic with any problems, questions or concerns. No barriers to learning was detected.  Repeat labs in 4-5 months for labs and clinic follow-up.   Tish Men, MD 12/22/2018 2:02 PM  CHIEF COMPLAINT: "I am doing okay  INTERVAL HISTORY: [name] returns to clinic for follow-up of chronic thrombocytopenia.  Patient reports that he underwent TURP in 07/2018, and had some persistent hematuria for approximately 3 weeks, with his urologist thought was expected after the procedure.  He also has been applying to eyedrops to the right eye for glaucoma, which causes some eye redness and blurred vision.  His gastroenterologist reduced his Imuran dose from 75 to 50 mg daily in 07/2018 due to the risk of developing severe complications from [place] as an immunocompromised patient.  He is scheduled for right anterior tibialis  tendon repair on 12/23/2018 at [facility].  He has some chronic bruises over the bilateral forearms, which he attributes to being on prednisone for over 6 months when he was first diagnosed with ulcerative colitis, but denies any recent excessive bruising or new onset bleeding.  He denies any other complaint today.  SUMMARY OF ONCOLOGIC HISTORY: Oncology History    No history exists.    REVIEW OF SYSTEMS:   Constitutional: ( - ) fevers, ( - )  chills , ( - ) night sweats Eyes: ( - ) blurriness of vision, ( - ) double vision, ( - ) watery eyes Ears, nose, mouth, throat, and face: ( - ) mucositis, ( - ) sore throat Respiratory: ( - ) cough, ( - ) dyspnea, ( - ) wheezes Cardiovascular: ( - ) palpitation, ( - ) chest discomfort, ( - ) lower extremity swelling Gastrointestinal:  ( - ) nausea, ( - ) heartburn, ( - ) change in bowel habits Skin: ( + ) abnormal skin changes Lymphatics: ( - ) new lymphadenopathy, ( - ) easy bruising Neurological: ( - ) numbness, ( - ) tingling, ( - ) new weaknesses Behavioral/Psych: ( - ) mood change, ( - ) new changes  All other systems were reviewed with the patient and are negative.  I have reviewed the past medical history, past surgical history, social history and family history with the patient and they are unchanged from previous note.  ALLERGIES:  is allergic to lipitor [atorvastatin] and sporanox [itraconazole].  MEDICATIONS:  Current Outpatient Medications  Medication Sig Dispense Refill  . aspirin EC 81 MG tablet Take 1 tablet (81 mg total) by mouth daily.    [name] azaTHIOprine (IMURAN) 50 MG tablet Take 75 mg by mouth every morning. Takes 1  1/2  TABS    . Cholecalciferol (VITAMIN D3) 1000 UNITS CAPS Take 2 capsules by mouth daily.     . Coenzyme Q10 (COQ10) 200 MG CAPS Take 1 capsule by mouth daily.    . dorzolamide-timolol (COSOPT) 22.3-6.8 MG/ML ophthalmic solution Place 1 drop into the right eye 3 (three) times daily.    . fish oil-omega-3 fatty acids 1000 MG capsule Take 1 g by mouth daily.     . fluoruracil (CARAC) 0.5 % cream Apply 1 application topically 2 (two) times daily.     . Ginkgo Biloba 40 MG TABS Take 120 mg by mouth daily.     . irbesartan (AVAPRO) 75 MG tablet Take 75 mg by mouth daily.    [name] levothyroxine (SYNTHROID, LEVOTHROID) 50 MCG tablet Take 50-100 mcg by mouth daily before breakfast.  Take 23mg everyday except Wednesday and Sunday take 1055m.    . mesalamine (APRISO) 0.375 G 24 hr capsule Take 2,250 mg by mouth daily. TAKES 6 TABS DAILY--  TOTAL '2250MG'$     . mesalamine (CANASA) 1000 MG suppository Place 1,000 mg rectally at bedtime.    . metroNIDAZOLE (METROGEL) 0.75 % gel Apply 1 application topically 2 (two) times daily as needed (Rosacea).     . Multiple Vitamins-Minerals (EQ COMPLETE MULTIVIT ADULT 50+ PO) Take 1 tablet by mouth daily.    . Netarsudil-Latanoprost (ROCKLATAN) 0.02-0.005 % SOLN Apply 1 drop to eye at bedtime.    . niacin (SLO-NIACIN) 500 MG tablet Take 500 mg by mouth at bedtime.    . simvastatin (ZOCOR) 20 MG tablet Take 10 mg by mouth every other day. TAKES QHS    . urea (CARMOL) 20 % cream Apply topically as needed.  No current facility-administered medications for this visit.     PHYSICAL EXAMINATION: ECOG PERFORMANCE STATUS: 2 - Symptomatic, <50% confined to bed  Today's Vitals   12/22/18 1343  BP: 137/81  Pulse: (!) 59  Resp: 19  Temp: (!) 97.5 F (36.4 C)  TempSrc: Temporal  SpO2: 100%  Weight: 180 lb (81.6 kg)  Height: _0  (1.88 m)  PainSc: 0-No pain   Body mass index is 23.11 kg/m.  Filed Weights   12/22/18 1343  Weight: 180 lb (81.6 kg)    GENERAL: alert, no distress and comfortable SKIN: some old chronic bruises over the bilateral forearms  EYES: conjunctiva are pink and non-injected, sclera clear OROPHARYNX: no exudate, no erythema; lips, buccal mucosa, and tongue normal  NECK: supple, non-tender LUNGS: clear to auscultation with normal breathing effort HEART: regular rate & rhythm and no murmurs and no lower extremity edema ABDOMEN: soft, non-tender, non-distended, normal bowel sounds Musculoskeletal: no cyanosis of digits and no clubbing  PSYCH: alert & oriented x 3, fluent speech NEURO: no focal motor/sensory deficits  LABORATORY DATA:  I have reviewed the data as listed    Component Value Date/Time   NA  141 12/22/2018 1312   K 4.7 12/22/2018 1312   CL 107 12/22/2018 1312   CO2 27 12/22/2018 1312   GLUCOSE 108 (H) 12/22/2018 1312   BUN 25 (H) 12/22/2018 1312   CREATININE 1.33 (H) 12/22/2018 1312   CALCIUM 9.3 12/22/2018 1312   PROT 6.7 12/22/2018 1312   ALBUMIN 4.4 12/22/2018 1312   AST 15 12/22/2018 1312   ALT 15 12/22/2018 1312   ALKPHOS 40 12/22/2018 1312   BILITOT 0.5 12/22/2018 1312   GFRNONAA 49 (L) 12/22/2018 1312   GFRAA 56 (L) 12/22/2018 1312    No results found for: SPEP, UPEP  Lab Results  Component Value Date   WBC 6.2 12/22/2018   NEUTROABS 3.5 12/22/2018   HGB 12.9 (L) 12/22/2018   HCT 40.7 12/22/2018   MCV 95.3 12/22/2018   PLT 76 (L) 12/22/2018      Chemistry      Component Value Date/Time   NA 141 12/22/2018 1312   K 4.7 12/22/2018 1312   CL 107 12/22/2018 1312   CO2 27 12/22/2018 1312   BUN 25 (H) 12/22/2018 1312   CREATININE 1.33 (H) 12/22/2018 1312      Component Value Date/Time   CALCIUM 9.3 12/22/2018 1312   ALKPHOS 40 12/22/2018 1312   AST 15 12/22/2018 1312   ALT 15 12/22/2018 1312   BILITOT 0.5 12/22/2018 1312

## 2018-12-22 NOTE — Telephone Encounter (Signed)
Called and spoke with patient's wife regarding appointments added she was ok with both date/time

## 2019-01-07 DIAGNOSIS — L821 Other seborrheic keratosis: Secondary | ICD-10-CM | POA: Diagnosis not present

## 2019-01-07 DIAGNOSIS — Z85828 Personal history of other malignant neoplasm of skin: Secondary | ICD-10-CM | POA: Diagnosis not present

## 2019-01-07 DIAGNOSIS — L720 Epidermal cyst: Secondary | ICD-10-CM | POA: Diagnosis not present

## 2019-01-07 DIAGNOSIS — L820 Inflamed seborrheic keratosis: Secondary | ICD-10-CM | POA: Diagnosis not present

## 2019-01-07 DIAGNOSIS — L570 Actinic keratosis: Secondary | ICD-10-CM | POA: Diagnosis not present

## 2019-01-19 DIAGNOSIS — R7301 Impaired fasting glucose: Secondary | ICD-10-CM | POA: Diagnosis not present

## 2019-01-19 DIAGNOSIS — E038 Other specified hypothyroidism: Secondary | ICD-10-CM | POA: Diagnosis not present

## 2019-01-19 DIAGNOSIS — H401133 Primary open-angle glaucoma, bilateral, severe stage: Secondary | ICD-10-CM | POA: Diagnosis not present

## 2019-01-19 DIAGNOSIS — E7849 Other hyperlipidemia: Secondary | ICD-10-CM | POA: Diagnosis not present

## 2019-01-20 ENCOUNTER — Encounter: Payer: Self-pay | Admitting: Internal Medicine

## 2019-01-20 ENCOUNTER — Other Ambulatory Visit: Payer: Self-pay

## 2019-01-20 ENCOUNTER — Telehealth: Payer: Self-pay | Admitting: *Deleted

## 2019-01-20 ENCOUNTER — Telehealth (INDEPENDENT_AMBULATORY_CARE_PROVIDER_SITE_OTHER): Payer: Medicare HMO | Admitting: Internal Medicine

## 2019-01-20 VITALS — BP 99/41 | HR 65 | Ht 74.0 in | Wt 175.0 lb

## 2019-01-20 DIAGNOSIS — I442 Atrioventricular block, complete: Secondary | ICD-10-CM

## 2019-01-20 NOTE — Telephone Encounter (Signed)
Spoke with patient. Scheduled for DC appointment 01/21/19 at 10:30am for PPM reprogramming. Pt reports he is aware of office address, no further questions at this time.

## 2019-01-20 NOTE — Progress Notes (Signed)
This was supposed to be an in office visit for device reprogramming.  Unfortunately, it was scheduled as virtual. This is not a billable encounter today.    He is doing well.  No need for a full office visit today. We discussed his device and remote monitoring at length.   Plan: No changes today We will bring into the office in the next few weeks to reduce chronic lead outputs and also to turn VIP off.  Thompson Grayer MD, [place] 01/20/2019 3:03 PM

## 2019-01-20 NOTE — Telephone Encounter (Signed)
-----   Message from Thompson Grayer, MD sent at 01/20/2019  3:04 PM EDT ----- Todays visit should have been in office for reprogramming as per my prior note, [place] July phone note, and the scheduling comments for the visit today.  I did not bill him for the visit.  Please bring into the device clinic in the next few days to reduce chronic lead outputs and also to turn VIP off.  Follow-up with EP NP In a year

## 2019-01-21 ENCOUNTER — Ambulatory Visit (INDEPENDENT_AMBULATORY_CARE_PROVIDER_SITE_OTHER): Payer: Medicare HMO | Admitting: *Deleted

## 2019-01-21 ENCOUNTER — Other Ambulatory Visit: Payer: Self-pay

## 2019-01-21 DIAGNOSIS — I441 Atrioventricular block, second degree: Secondary | ICD-10-CM | POA: Diagnosis not present

## 2019-01-21 DIAGNOSIS — I480 Paroxysmal atrial fibrillation: Secondary | ICD-10-CM | POA: Diagnosis not present

## 2019-01-21 DIAGNOSIS — Z959 Presence of cardiac and vascular implant and graft, unspecified: Secondary | ICD-10-CM

## 2019-01-25 LAB — CUP PACEART INCLINIC DEVICE CHECK
Battery Remaining Longevity: 103 mo
Battery Voltage: 2.98 V
Brady Statistic RA Percent Paced: 74 %
Brady Statistic RV Percent Paced: 95 %
Date Time Interrogation Session: 20200910143952
Implantable Lead Implant Date: 20191226
Implantable Lead Implant Date: 20191226
Implantable Lead Location: 753859
Implantable Lead Location: 753860
Implantable Pulse Generator Implant Date: 20191226
Lead Channel Impedance Value: 562.5 Ohm
Lead Channel Impedance Value: 562.5 Ohm
Lead Channel Pacing Threshold Amplitude: 0.5 V
Lead Channel Pacing Threshold Amplitude: 0.75 V
Lead Channel Pacing Threshold Pulse Width: 0.5 ms
Lead Channel Pacing Threshold Pulse Width: 0.5 ms
Lead Channel Sensing Intrinsic Amplitude: 11.5 mV
Lead Channel Sensing Intrinsic Amplitude: 2.9 mV
Lead Channel Setting Pacing Amplitude: 2 V
Lead Channel Setting Pacing Amplitude: 2.5 V
Lead Channel Setting Pacing Pulse Width: 0.5 ms
Lead Channel Setting Sensing Sensitivity: 2 mV
Pulse Gen Model: 2272
Pulse Gen Serial Number: 9094310

## 2019-01-25 NOTE — Progress Notes (Signed)
Pacemaker check in clinic to optimize programming per Dr. Rayann Heman. Normal device function. Thresholds, sensing, impedances consistent with previous measurements. Device programmed to maximize longevity; RA output reduced to 2.0V, RV output reduced to 2.5V. <1% AT/AF burden, no OAC due to low burden/short duration, longest 1hr 26min in 05/2018, previously reviewed by Dr. Rayann Heman. No high ventricular rates noted. Device programmed at appropriate safety margins; VIP turned off (VP 95%), VP % alert turned off. Histogram distribution appropriate for patient activity level. Estimated longevity 8.6-9.3 years. Patient enrolled in remote follow-up. Patient education completed. [place] on 02/05/19 and ROV with EP APP in 01/2020.

## 2019-01-28 DIAGNOSIS — Z23 Encounter for immunization: Secondary | ICD-10-CM | POA: Diagnosis not present

## 2019-02-05 ENCOUNTER — Ambulatory Visit (INDEPENDENT_AMBULATORY_CARE_PROVIDER_SITE_OTHER): Payer: Medicare HMO | Admitting: *Deleted

## 2019-02-05 DIAGNOSIS — I441 Atrioventricular block, second degree: Secondary | ICD-10-CM

## 2019-02-05 LAB — CUP PACEART REMOTE DEVICE CHECK
Battery Remaining Longevity: 106 mo
Battery Remaining Percentage: 95.5 %
Battery Voltage: 2.98 V
Brady Statistic AP VP Percent: 77 %
Brady Statistic AP VS Percent: 1 %
Brady Statistic AS VP Percent: 21 %
Brady Statistic AS VS Percent: 1.3 %
Brady Statistic RA Percent Paced: 77 %
Brady Statistic RV Percent Paced: 98 %
Date Time Interrogation Session: 20200925083734
Implantable Lead Implant Date: 20191226
Implantable Lead Implant Date: 20191226
Implantable Lead Location: 753859
Implantable Lead Location: 753860
Implantable Pulse Generator Implant Date: 20191226
Lead Channel Impedance Value: 540 Ohm
Lead Channel Impedance Value: 560 Ohm
Lead Channel Pacing Threshold Amplitude: 0.5 V
Lead Channel Pacing Threshold Amplitude: 0.75 V
Lead Channel Pacing Threshold Pulse Width: 0.5 ms
Lead Channel Pacing Threshold Pulse Width: 0.5 ms
Lead Channel Sensing Intrinsic Amplitude: 11.9 mV
Lead Channel Sensing Intrinsic Amplitude: 4.6 mV
Lead Channel Setting Pacing Amplitude: 2 V
Lead Channel Setting Pacing Amplitude: 2.5 V
Lead Channel Setting Pacing Pulse Width: 0.5 ms
Lead Channel Setting Sensing Sensitivity: 2 mV
Pulse Gen Model: 2272
Pulse Gen Serial Number: 9094310

## 2019-02-09 ENCOUNTER — Encounter: Payer: Self-pay | Admitting: Cardiology

## 2019-02-09 NOTE — Progress Notes (Signed)
Remote pacemaker transmission.   

## 2019-04-20 DIAGNOSIS — H401133 Primary open-angle glaucoma, bilateral, severe stage: Secondary | ICD-10-CM | POA: Diagnosis not present

## 2019-05-10 ENCOUNTER — Ambulatory Visit (INDEPENDENT_AMBULATORY_CARE_PROVIDER_SITE_OTHER): Payer: Medicare HMO | Admitting: *Deleted

## 2019-05-10 DIAGNOSIS — I441 Atrioventricular block, second degree: Secondary | ICD-10-CM | POA: Diagnosis not present

## 2019-05-10 LAB — CUP PACEART REMOTE DEVICE CHECK
Battery Remaining Longevity: 106 mo
Battery Remaining Percentage: 95.5 %
Battery Voltage: 2.99 V
Brady Statistic AP VP Percent: 79 %
Brady Statistic AP VS Percent: 1 %
Brady Statistic AS VP Percent: 20 %
Brady Statistic AS VS Percent: 1 %
Brady Statistic RA Percent Paced: 78 %
Brady Statistic RV Percent Paced: 99 %
Date Time Interrogation Session: 20201228020014
Implantable Lead Implant Date: 20191226
Implantable Lead Implant Date: 20191226
Implantable Lead Location: 753859
Implantable Lead Location: 753860
Implantable Pulse Generator Implant Date: 20191226
Lead Channel Impedance Value: 540 Ohm
Lead Channel Impedance Value: 580 Ohm
Lead Channel Pacing Threshold Amplitude: 0.5 V
Lead Channel Pacing Threshold Amplitude: 0.75 V
Lead Channel Pacing Threshold Pulse Width: 0.5 ms
Lead Channel Pacing Threshold Pulse Width: 0.5 ms
Lead Channel Sensing Intrinsic Amplitude: 12 mV
Lead Channel Sensing Intrinsic Amplitude: 2.6 mV
Lead Channel Setting Pacing Amplitude: 2 V
Lead Channel Setting Pacing Amplitude: 2.5 V
Lead Channel Setting Pacing Pulse Width: 0.5 ms
Lead Channel Setting Sensing Sensitivity: 2 mV
Pulse Gen Model: 2272
Pulse Gen Serial Number: 9094310

## 2019-05-24 ENCOUNTER — Ambulatory Visit: Payer: Medicare Other | Attending: Internal Medicine

## 2019-05-24 DIAGNOSIS — Z23 Encounter for immunization: Secondary | ICD-10-CM | POA: Insufficient documentation

## 2019-05-24 NOTE — Progress Notes (Signed)
   Covid-19 Vaccination Clinic  Name:  Joseph Hanna    MRN: [id number] DOB: 09-06-1932  05/24/2019  [name] was observed post Covid-19 immunization for 15 minutes without incidence. He was provided with Vaccine Information Sheet and instruction to access the V-Safe system.   Joseph Hanna was instructed to call 911 with any severe reactions post vaccine: [name] Difficulty breathing  . Swelling of your face and throat  . A fast heartbeat  . A bad rash all over your body  . Dizziness and weakness

## 2019-05-25 ENCOUNTER — Encounter: Payer: Self-pay | Admitting: Hematology

## 2019-05-25 ENCOUNTER — Other Ambulatory Visit: Payer: Self-pay

## 2019-05-25 ENCOUNTER — Inpatient Hospital Stay: Payer: Medicare HMO | Attending: Hematology

## 2019-05-25 ENCOUNTER — Inpatient Hospital Stay (HOSPITAL_BASED_OUTPATIENT_CLINIC_OR_DEPARTMENT_OTHER): Payer: Medicare HMO | Admitting: Hematology

## 2019-05-25 VITALS — BP 148/68 | HR 61 | Temp 97.3°F | Resp 17 | Ht 74.0 in | Wt 173.0 lb

## 2019-05-25 DIAGNOSIS — Z8719 Personal history of other diseases of the digestive system: Secondary | ICD-10-CM

## 2019-05-25 DIAGNOSIS — Z905 Acquired absence of kidney: Secondary | ICD-10-CM | POA: Insufficient documentation

## 2019-05-25 DIAGNOSIS — D649 Anemia, unspecified: Secondary | ICD-10-CM

## 2019-05-25 DIAGNOSIS — K769 Liver disease, unspecified: Secondary | ICD-10-CM | POA: Diagnosis not present

## 2019-05-25 DIAGNOSIS — K5190 Ulcerative colitis, unspecified, without complications: Secondary | ICD-10-CM | POA: Diagnosis not present

## 2019-05-25 DIAGNOSIS — D696 Thrombocytopenia, unspecified: Secondary | ICD-10-CM | POA: Diagnosis not present

## 2019-05-25 DIAGNOSIS — N189 Chronic kidney disease, unspecified: Secondary | ICD-10-CM | POA: Insufficient documentation

## 2019-05-25 DIAGNOSIS — N179 Acute kidney failure, unspecified: Secondary | ICD-10-CM

## 2019-05-25 LAB — CBC WITH DIFFERENTIAL (CANCER CENTER ONLY)
Abs Immature Granulocytes: 0.04 10*3/uL (ref 0.00–0.07)
Basophils Absolute: 0 10*3/uL (ref 0.0–0.1)
Basophils Relative: 0 %
Eosinophils Absolute: 0 10*3/uL (ref 0.0–0.5)
Eosinophils Relative: 0 %
HCT: 40.6 % (ref 39.0–52.0)
Hemoglobin: 12.9 g/dL — ABNORMAL LOW (ref 13.0–17.0)
Immature Granulocytes: 1 %
Lymphocytes Relative: 11 %
Lymphs Abs: 0.7 10*3/uL (ref 0.7–4.0)
MCH: 30.4 pg (ref 26.0–34.0)
MCHC: 31.8 g/dL (ref 30.0–36.0)
MCV: 95.5 fL (ref 80.0–100.0)
Monocytes Absolute: 2 10*3/uL — ABNORMAL HIGH (ref 0.1–1.0)
Monocytes Relative: 31 %
Neutro Abs: 3.6 10*3/uL (ref 1.7–7.7)
Neutrophils Relative %: 57 %
Platelet Count: 65 10*3/uL — ABNORMAL LOW (ref 150–400)
RBC: 4.25 MIL/uL (ref 4.22–5.81)
RDW: 13.9 % (ref 11.5–15.5)
WBC Count: 6.3 10*3/uL (ref 4.0–10.5)
nRBC: 0 % (ref 0.0–0.2)

## 2019-05-25 LAB — CMP (CANCER CENTER ONLY)
ALT: 11 U/L (ref 0–44)
AST: 13 U/L — ABNORMAL LOW (ref 15–41)
Albumin: 4.6 g/dL (ref 3.5–5.0)
Alkaline Phosphatase: 43 U/L (ref 38–126)
Anion gap: 5 (ref 5–15)
BUN: 32 mg/dL — ABNORMAL HIGH (ref 8–23)
CO2: 30 mmol/L (ref 22–32)
Calcium: 10.1 mg/dL (ref 8.9–10.3)
Chloride: 107 mmol/L (ref 98–111)
Creatinine: 1.63 mg/dL — ABNORMAL HIGH (ref 0.61–1.24)
GFR, Est AFR Am: 44 mL/min — ABNORMAL LOW (ref >60)
GFR, Estimated: 38 mL/min — ABNORMAL LOW (ref >60)
Glucose, Bld: 102 mg/dL — ABNORMAL HIGH (ref 70–99)
Potassium: 4.4 mmol/L (ref 3.5–5.1)
Sodium: 142 mmol/L (ref 135–145)
Total Bilirubin: 0.6 mg/dL (ref 0.3–1.2)
Total Protein: 6.8 g/dL (ref 6.5–8.1)

## 2019-05-25 LAB — LACTATE DEHYDROGENASE: LDH: 160 U/L (ref 98–192)

## 2019-05-25 LAB — SAVE SMEAR(SSMR), FOR PROVIDER SLIDE REVIEW

## 2019-05-25 NOTE — Progress Notes (Signed)
[name] OFFICE PROGRESS NOTE  Patient Care Team: Joseph Infante, MD as PCP - General (Internal Medicine) Joseph Blanks, MD as PCP - Cardiology (Cardiology)  HEME/ONC OVERVIEW: 1. Thrombocytopenia -Likely due to Imuran and underlying liver disease  -Plts 107k on last known CBC in 2017; stable ~60-80ks since late 2019  Coags, nutritional and infectious w/u unremarkable   Increased echogenicity of liver on abd [place]  -On observation   PERTINENT NON-HEM/ONC PROBLEMS: 1. Ulcerative colitis on Imuran since 2002   ASSESSMENT & PLAN:   Thrombocytopenia -Likely multifactorial, including immunosuppression with Imuran and underlying liver disease -Extensive work-up, including serologic studies, was unremarkable; mild increased echogenicity of the liver on abdominal US, suggesting some liver disease  -Clinically, patient denies any constitutional symptoms or symptoms of bleeding  -Plts 65k today, stable -I personally reviewed the patient's peripheral blood smear today.  The red blood cells were of normal morphology.  There was no schistocytosis.  The white blood cells were of normal morphology. There were no peripheral circulating blasts. The platelets were of normal size and I verified that there were no platelet clumping. -Given the overall stable thrombocytopenia over at least 1 year in the setting of chronic, ongoing immunosuppression and the absence of any other concerning symptoms, bone marrow biopsy would unlikely change the management, even if there is low grade MDS in the bone marrow  -We will plan to monitor his CBC q30month for any interval changes -In addition, I have ordered flow cytometry, BCR/ABL FISH and MPN NGS for the next visit  -However, if he develops symptoms of bleeding/excess bruising, progressive thrombocytopenia (plts < 50k) or other cytopenias, it would be reasonable to perform bone marrow biopsy to rule out other disorders   Normocytic  anemia -Likely anemia of chronic disease  -Hgb 12.9 today, stable -Iron profile pending  -Patient denies any symptoms of bleeding, such as hematuria, hematochezia, or melena -We will monitor it for now   AKI on CKD -Patient has a remote hx of left nephrectomy for RCC; baseline Cr ~1.3 -Cr 1.63 today, slightly elevated -I counseled the patient on the importance of maintaining adequate hydration and avoiding nephrotoxic medication, such as NSAIDs -I also advised the patient to hold irbesartan, and follow up with his PCP in 1 to 2 weeks for repeat labs to monitor the renal function -A copy of the clinic note today will be sent to the patient's PCP for review  Ulcerative colitis -Currently well controlled -Imuran 50 mg daily since 07/2018 (previously 75 mg daily for many years) -No symptoms of UC flare -Continue follow-up with gastroenterology  Orders Placed This Encounter  Procedures  . CBC w/ diff    Standing Status:   Future    Standing Expiration Date:   06/28/2020  . CMP    Standing Status:   Future    Standing Expiration Date:   06/28/2020  . LDH    Standing Status:   Future    Standing Expiration Date:   06/28/2020  . Flow Cytometry    Standing Status:   Future    Standing Expiration Date:   06/28/2020  . BCR ABL1 FISH ([facility])    Standing Status:   Future    Standing Expiration Date:   05/24/2020  . JAK2 (INCLUDING V617F AND EXON 12), MPL,& CALR W/RFL MPN PANEL (NGS)    Standing Status:   Future    Standing Expiration Date:   05/24/2020   The total time spent in the appointment  was 32 minutes encounter with patients including review of chart and various tests results, discussions about plan of care and coordination of care plan  All questions were answered. The patient knows to call the clinic with any problems, questions or concerns. No barriers to learning was detected.  Return in 3 months for labs and clinic follow-up.   Joseph Men, MD 1/12/20212:03 PM  CHIEF  COMPLAINT: "I am doing okay"  INTERVAL HISTORY: [name] returns clinic for follow-up of chronic thrombocytopenia.  Patient reports that he has been doing relatively well since last visit, and denies any constitutional symptoms, chest pain, dyspnea, abnormal bleeding or bruising.  Since his recent prostate surgery, he has been urinating well without any difficulty.  He received his Covid vaccine yesterday.  He denies any complaint today.  REVIEW OF SYSTEMS:   Constitutional: ( - ) fevers, ( - )  chills , ( - ) night sweats Eyes: ( - ) blurriness of vision, ( - ) double vision, ( - ) watery eyes Ears, nose, mouth, throat, and face: ( - ) mucositis, ( - ) sore throat Respiratory: ( - ) cough, ( - ) dyspnea, ( - ) wheezes Cardiovascular: ( - ) palpitation, ( - ) chest discomfort, ( - ) lower extremity swelling Gastrointestinal:  ( - ) nausea, ( - ) heartburn, ( - ) change in bowel habits Skin: ( - ) abnormal skin rashes Lymphatics: ( - ) new lymphadenopathy, ( - ) easy bruising Neurological: ( - ) numbness, ( - ) tingling, ( - ) new weaknesses Behavioral/Psych: ( - ) mood change, ( - ) new changes  All other systems were reviewed with the patient and are negative.  SUMMARY OF ONCOLOGIC HISTORY: Oncology History   No history exists.    I have reviewed the past medical history, past surgical history, social history and family history with the patient and they are unchanged from previous note.  ALLERGIES:  is allergic to itraconazole; other; and lipitor [atorvastatin].  MEDICATIONS:  Current Outpatient Medications  Medication Sig Dispense Refill  . aspirin EC 81 MG tablet Take 1 tablet (81 mg total) by mouth daily.    [name] azaTHIOprine (IMURAN) 50 MG tablet Take 50 mg by mouth every morning. Takes 1  1/2  TABS    . Cholecalciferol (VITAMIN D3) 1000 UNITS CAPS Take 2 capsules by mouth daily.     . Coenzyme Q10 (COQ10) 200 MG CAPS Take 1 capsule by mouth daily.    . dorzolamide-timolol  (COSOPT) 22.3-6.8 MG/ML ophthalmic solution Place 1 drop into the right eye 3 (three) times daily.    . fish oil-omega-3 fatty acids 1000 MG capsule Take 1 g by mouth daily.     . fluoruracil (CARAC) 0.5 % cream Apply 1 application topically 2 (two) times daily.     . Ginkgo Biloba 40 MG TABS Take 120 mg by mouth daily.     . irbesartan (AVAPRO) 75 MG tablet Take 75 mg by mouth daily.    [name] levothyroxine (SYNTHROID, LEVOTHROID) 50 MCG tablet Take 50-100 mcg by mouth daily before breakfast. Take 60mg everyday except Wednesday and Sunday take 1038m.    . mesalamine (APRISO) 0.375 G 24 hr capsule Take 2,250 mg by mouth daily. TAKES 6 TABS DAILY--  TOTAL 2250MG    . mesalamine (CANASA) 1000 MG suppository Place 1,000 mg rectally at bedtime.    . metroNIDAZOLE (METROGEL) 0.75 % gel Apply 1 application topically 2 (two) times daily as needed (Rosacea).     .[name]  Multiple Vitamins-Minerals (EQ COMPLETE MULTIVIT ADULT 50+ PO) Take 1 tablet by mouth daily.    . Netarsudil-Latanoprost (ROCKLATAN) 0.02-0.005 % SOLN Apply 1 drop to eye at bedtime.    . niacin (SLO-NIACIN) 500 MG tablet Take 500 mg by mouth at bedtime.    . simvastatin (ZOCOR) 20 MG tablet Take 10 mg by mouth every other day. TAKES QHS    . urea (CARMOL) 20 % cream Apply topically as needed.     No current facility-administered medications for this visit.    PHYSICAL EXAMINATION: ECOG PERFORMANCE STATUS: 1 - Symptomatic but completely ambulatory  Today's Vitals   05/25/19 1320  BP: (!) 148/68  Pulse: 61  Resp: 17  Temp: (!) 97.3 F (36.3 C)  TempSrc: Temporal  SpO2: 100%  Weight: 173 lb (78.5 kg)  Height: 6' 2" (1.88 m)   Body mass index is 22.21 kg/m.  Filed Weights   05/25/19 1320  Weight: 173 lb (78.5 kg)    GENERAL: alert, no distress and comfortable, elderly appearing  SKIN: skin color, texture, turgor are normal, no rashes or significant lesions EYES: slight R eye injection due to glaucoma eye drops  OROPHARYNX: no  exudate, no erythema; lips, buccal mucosa, and tongue normal  NECK: supple, non-tender LYMPH:  no palpable lymphadenopathy in the cervical LUNGS: clear to auscultation with normal breathing effort HEART: regular rate & rhythm and no murmurs and no lower extremity edema ABDOMEN: soft, non-tender, non-distended, normal bowel sounds Musculoskeletal: no cyanosis of digits and no clubbing  PSYCH: alert & oriented x 3, fluent speech  LABORATORY DATA:  I have reviewed the data as listed    Component Value Date/Time   NA 142 05/25/2019 1303   K 4.4 05/25/2019 1303   CL 107 05/25/2019 1303   CO2 30 05/25/2019 1303   GLUCOSE 102 (H) 05/25/2019 1303   BUN 32 (H) 05/25/2019 1303   CREATININE 1.63 (H) 05/25/2019 1303   CALCIUM 10.1 05/25/2019 1303   PROT 6.8 05/25/2019 1303   ALBUMIN 4.6 05/25/2019 1303   AST 13 (L) 05/25/2019 1303   ALT 11 05/25/2019 1303   ALKPHOS 43 05/25/2019 1303   BILITOT 0.6 05/25/2019 1303   GFRNONAA 38 (L) 05/25/2019 1303   GFRAA 44 (L) 05/25/2019 1303    No results found for: SPEP, UPEP  Lab Results  Component Value Date   WBC 6.3 05/25/2019   NEUTROABS 3.6 05/25/2019   HGB 12.9 (L) 05/25/2019   HCT 40.6 05/25/2019   MCV 95.5 05/25/2019   PLT 65 (L) 05/25/2019      Chemistry      Component Value Date/Time   NA 142 05/25/2019 1303   K 4.4 05/25/2019 1303   CL 107 05/25/2019 1303   CO2 30 05/25/2019 1303   BUN 32 (H) 05/25/2019 1303   CREATININE 1.63 (H) 05/25/2019 1303      Component Value Date/Time   CALCIUM 10.1 05/25/2019 1303   ALKPHOS 43 05/25/2019 1303   AST 13 (L) 05/25/2019 1303   ALT 11 05/25/2019 1303   BILITOT 0.6 05/25/2019 1303       RADIOGRAPHIC STUDIES: I have personally reviewed the radiological images as listed below and agreed with the findings in the report. CUP PACEART REMOTE DEVICE CHECK  Result Date: 05/10/2019 Scheduled remote reviewed.  Normal device function.  Longest AMS 30 mins. No OAC noted. Next remote 91  days.

## 2019-05-26 LAB — IRON AND TIBC
Iron: 96 ug/dL (ref 42–163)
Saturation Ratios: 31 % (ref 20–55)
TIBC: 308 ug/dL (ref 202–409)
UIBC: 212 ug/dL (ref 117–376)

## 2019-05-26 LAB — FERRITIN: Ferritin: 70 ng/mL (ref 24–336)

## 2019-05-31 DIAGNOSIS — R69 Illness, unspecified: Secondary | ICD-10-CM | POA: Diagnosis not present

## 2019-06-13 ENCOUNTER — Ambulatory Visit: Payer: Medicare HMO | Attending: Internal Medicine

## 2019-06-13 DIAGNOSIS — Z23 Encounter for immunization: Secondary | ICD-10-CM | POA: Insufficient documentation

## 2019-06-13 NOTE — Progress Notes (Signed)
   Covid-19 Vaccination Clinic  Name:  Joseph Hanna    MRN: [id number] DOB: [birth year]  06/13/2019  [name] was observed post Covid-19 immunization for 15 minutes without incidence. He was provided with Vaccine Information Sheet and instruction to access the V-Safe system.   [name] was instructed to call 911 with any severe reactions post vaccine: [name] Difficulty breathing  . Swelling of your face and throat  . A fast heartbeat  . A bad rash all over your body  . Dizziness and weakness    Immunizations Administered    Name Date Dose VIS Date Route   Pfizer COVID-19 Vaccine 06/13/2019 10:53 AM 0.3 mL 04/23/2019 Intramuscular   Manufacturer: [place]   Lot: [id number]   Edith Endave: [id number]

## 2019-07-09 DIAGNOSIS — L719 Rosacea, unspecified: Secondary | ICD-10-CM | POA: Diagnosis not present

## 2019-07-09 DIAGNOSIS — M1990 Unspecified osteoarthritis, unspecified site: Secondary | ICD-10-CM | POA: Diagnosis not present

## 2019-07-09 DIAGNOSIS — G8929 Other chronic pain: Secondary | ICD-10-CM | POA: Diagnosis not present

## 2019-07-09 DIAGNOSIS — Z008 Encounter for other general examination: Secondary | ICD-10-CM | POA: Diagnosis not present

## 2019-07-09 DIAGNOSIS — I739 Peripheral vascular disease, unspecified: Secondary | ICD-10-CM | POA: Diagnosis not present

## 2019-07-09 DIAGNOSIS — K5190 Ulcerative colitis, unspecified, without complications: Secondary | ICD-10-CM | POA: Diagnosis not present

## 2019-07-09 DIAGNOSIS — E785 Hyperlipidemia, unspecified: Secondary | ICD-10-CM | POA: Diagnosis not present

## 2019-07-09 DIAGNOSIS — L570 Actinic keratosis: Secondary | ICD-10-CM | POA: Diagnosis not present

## 2019-07-09 DIAGNOSIS — H409 Unspecified glaucoma: Secondary | ICD-10-CM | POA: Diagnosis not present

## 2019-07-09 DIAGNOSIS — N529 Male erectile dysfunction, unspecified: Secondary | ICD-10-CM | POA: Diagnosis not present

## 2019-07-09 DIAGNOSIS — E039 Hypothyroidism, unspecified: Secondary | ICD-10-CM | POA: Diagnosis not present

## 2019-07-13 DIAGNOSIS — L720 Epidermal cyst: Secondary | ICD-10-CM | POA: Diagnosis not present

## 2019-07-13 DIAGNOSIS — L723 Sebaceous cyst: Secondary | ICD-10-CM | POA: Diagnosis not present

## 2019-07-13 DIAGNOSIS — L570 Actinic keratosis: Secondary | ICD-10-CM | POA: Diagnosis not present

## 2019-07-13 DIAGNOSIS — Z85828 Personal history of other malignant neoplasm of skin: Secondary | ICD-10-CM | POA: Diagnosis not present

## 2019-07-13 DIAGNOSIS — D485 Neoplasm of uncertain behavior of skin: Secondary | ICD-10-CM | POA: Diagnosis not present

## 2019-07-13 DIAGNOSIS — L821 Other seborrheic keratosis: Secondary | ICD-10-CM | POA: Diagnosis not present

## 2019-07-13 DIAGNOSIS — L812 Freckles: Secondary | ICD-10-CM | POA: Diagnosis not present

## 2019-07-13 DIAGNOSIS — D1801 Hemangioma of skin and subcutaneous tissue: Secondary | ICD-10-CM | POA: Diagnosis not present

## 2019-07-13 DIAGNOSIS — C4441 Basal cell carcinoma of skin of scalp and neck: Secondary | ICD-10-CM | POA: Diagnosis not present

## 2019-07-14 DIAGNOSIS — E039 Hypothyroidism, unspecified: Secondary | ICD-10-CM | POA: Diagnosis not present

## 2019-07-14 DIAGNOSIS — R7301 Impaired fasting glucose: Secondary | ICD-10-CM | POA: Diagnosis not present

## 2019-07-14 DIAGNOSIS — E7849 Other hyperlipidemia: Secondary | ICD-10-CM | POA: Diagnosis not present

## 2019-07-14 DIAGNOSIS — Z0000 Encounter for general adult medical examination without abnormal findings: Secondary | ICD-10-CM | POA: Diagnosis not present

## 2019-07-14 DIAGNOSIS — M542 Cervicalgia: Secondary | ICD-10-CM | POA: Diagnosis not present

## 2019-07-19 DIAGNOSIS — R82998 Other abnormal findings in urine: Secondary | ICD-10-CM | POA: Diagnosis not present

## 2019-07-21 DIAGNOSIS — I499 Cardiac arrhythmia, unspecified: Secondary | ICD-10-CM | POA: Diagnosis not present

## 2019-07-21 DIAGNOSIS — K5190 Ulcerative colitis, unspecified, without complications: Secondary | ICD-10-CM | POA: Diagnosis not present

## 2019-07-21 DIAGNOSIS — C649 Malignant neoplasm of unspecified kidney, except renal pelvis: Secondary | ICD-10-CM | POA: Diagnosis not present

## 2019-07-21 DIAGNOSIS — Z950 Presence of cardiac pacemaker: Secondary | ICD-10-CM | POA: Diagnosis not present

## 2019-07-21 DIAGNOSIS — R011 Cardiac murmur, unspecified: Secondary | ICD-10-CM | POA: Diagnosis not present

## 2019-07-21 DIAGNOSIS — Z1331 Encounter for screening for depression: Secondary | ICD-10-CM | POA: Diagnosis not present

## 2019-07-21 DIAGNOSIS — R030 Elevated blood-pressure reading, without diagnosis of hypertension: Secondary | ICD-10-CM | POA: Diagnosis not present

## 2019-07-21 DIAGNOSIS — N401 Enlarged prostate with lower urinary tract symptoms: Secondary | ICD-10-CM | POA: Diagnosis not present

## 2019-07-21 DIAGNOSIS — I739 Peripheral vascular disease, unspecified: Secondary | ICD-10-CM | POA: Diagnosis not present

## 2019-07-21 DIAGNOSIS — Z0000 Encounter for general adult medical examination without abnormal findings: Secondary | ICD-10-CM | POA: Diagnosis not present

## 2019-07-21 DIAGNOSIS — N183 Chronic kidney disease, stage 3 unspecified: Secondary | ICD-10-CM | POA: Diagnosis not present

## 2019-07-21 DIAGNOSIS — Z1339 Encounter for screening examination for other mental health and behavioral disorders: Secondary | ICD-10-CM | POA: Diagnosis not present

## 2019-07-26 DIAGNOSIS — N401 Enlarged prostate with lower urinary tract symptoms: Secondary | ICD-10-CM | POA: Diagnosis not present

## 2019-07-26 DIAGNOSIS — R339 Retention of urine, unspecified: Secondary | ICD-10-CM | POA: Diagnosis not present

## 2019-07-26 DIAGNOSIS — R972 Elevated prostate specific antigen [PSA]: Secondary | ICD-10-CM | POA: Diagnosis not present

## 2019-07-26 DIAGNOSIS — R310 Gross hematuria: Secondary | ICD-10-CM | POA: Diagnosis not present

## 2019-08-02 DIAGNOSIS — S86219A Strain of muscle(s) and tendon(s) of anterior muscle group at lower leg level, unspecified leg, initial encounter: Secondary | ICD-10-CM | POA: Diagnosis not present

## 2019-08-03 DIAGNOSIS — R310 Gross hematuria: Secondary | ICD-10-CM | POA: Diagnosis not present

## 2019-08-09 ENCOUNTER — Ambulatory Visit (INDEPENDENT_AMBULATORY_CARE_PROVIDER_SITE_OTHER): Payer: Medicare HMO | Admitting: *Deleted

## 2019-08-09 DIAGNOSIS — I441 Atrioventricular block, second degree: Secondary | ICD-10-CM

## 2019-08-09 LAB — CUP PACEART REMOTE DEVICE CHECK
Battery Remaining Longevity: 101 mo
Battery Remaining Percentage: 95.5 %
Battery Voltage: 2.99 V
Brady Statistic AP VP Percent: 78 %
Brady Statistic AP VS Percent: 1 %
Brady Statistic AS VP Percent: 21 %
Brady Statistic AS VS Percent: 1 %
Brady Statistic RA Percent Paced: 78 %
Brady Statistic RV Percent Paced: 99 %
Date Time Interrogation Session: 20210329020021
Implantable Lead Implant Date: 20191226
Implantable Lead Implant Date: 20191226
Implantable Lead Location: 753859
Implantable Lead Location: 753860
Implantable Pulse Generator Implant Date: 20191226
Lead Channel Impedance Value: 410 Ohm
Lead Channel Impedance Value: 510 Ohm
Lead Channel Pacing Threshold Amplitude: 0.5 V
Lead Channel Pacing Threshold Amplitude: 0.75 V
Lead Channel Pacing Threshold Pulse Width: 0.5 ms
Lead Channel Pacing Threshold Pulse Width: 0.5 ms
Lead Channel Sensing Intrinsic Amplitude: 11.1 mV
Lead Channel Sensing Intrinsic Amplitude: 3.3 mV
Lead Channel Setting Pacing Amplitude: 2 V
Lead Channel Setting Pacing Amplitude: 2.5 V
Lead Channel Setting Pacing Pulse Width: 0.5 ms
Lead Channel Setting Sensing Sensitivity: 2 mV
Pulse Gen Model: 2272
Pulse Gen Serial Number: 9094310

## 2019-08-10 NOTE — Progress Notes (Signed)
PPM Remote  

## 2019-08-12 DIAGNOSIS — H524 Presbyopia: Secondary | ICD-10-CM | POA: Diagnosis not present

## 2019-08-23 ENCOUNTER — Encounter: Payer: Self-pay | Admitting: Hematology

## 2019-08-23 ENCOUNTER — Other Ambulatory Visit: Payer: Self-pay

## 2019-08-23 ENCOUNTER — Inpatient Hospital Stay (HOSPITAL_BASED_OUTPATIENT_CLINIC_OR_DEPARTMENT_OTHER): Payer: Medicare HMO | Admitting: Hematology

## 2019-08-23 ENCOUNTER — Inpatient Hospital Stay: Payer: Medicare HMO | Attending: Hematology

## 2019-08-23 VITALS — BP 153/59 | HR 62 | Temp 97.1°F | Resp 18 | Ht 74.0 in | Wt 170.0 lb

## 2019-08-23 DIAGNOSIS — D696 Thrombocytopenia, unspecified: Secondary | ICD-10-CM

## 2019-08-23 DIAGNOSIS — K5190 Ulcerative colitis, unspecified, without complications: Secondary | ICD-10-CM | POA: Insufficient documentation

## 2019-08-23 DIAGNOSIS — N189 Chronic kidney disease, unspecified: Secondary | ICD-10-CM | POA: Insufficient documentation

## 2019-08-23 DIAGNOSIS — Z905 Acquired absence of kidney: Secondary | ICD-10-CM | POA: Insufficient documentation

## 2019-08-23 LAB — CMP (CANCER CENTER ONLY)
ALT: 12 U/L (ref 0–44)
AST: 15 U/L (ref 15–41)
Albumin: 4.5 g/dL (ref 3.5–5.0)
Alkaline Phosphatase: 45 U/L (ref 38–126)
Anion gap: 6 (ref 5–15)
BUN: 23 mg/dL (ref 8–23)
CO2: 29 mmol/L (ref 22–32)
Calcium: 9.9 mg/dL (ref 8.9–10.3)
Chloride: 110 mmol/L (ref 98–111)
Creatinine: 1.28 mg/dL — ABNORMAL HIGH (ref 0.61–1.24)
GFR, Est AFR Am: 59 mL/min — ABNORMAL LOW (ref >60)
GFR, Estimated: 51 mL/min — ABNORMAL LOW (ref >60)
Glucose, Bld: 88 mg/dL (ref 70–99)
Potassium: 4.6 mmol/L (ref 3.5–5.1)
Sodium: 145 mmol/L (ref 135–145)
Total Bilirubin: 0.5 mg/dL (ref 0.3–1.2)
Total Protein: 6.8 g/dL (ref 6.5–8.1)

## 2019-08-23 LAB — CBC WITH DIFFERENTIAL (CANCER CENTER ONLY)
Abs Immature Granulocytes: 0.03 10*3/uL (ref 0.00–0.07)
Basophils Absolute: 0 10*3/uL (ref 0.0–0.1)
Basophils Relative: 0 %
Eosinophils Absolute: 0 10*3/uL (ref 0.0–0.5)
Eosinophils Relative: 0 %
HCT: 42.5 % (ref 39.0–52.0)
Hemoglobin: 13.1 g/dL (ref 13.0–17.0)
Immature Granulocytes: 1 %
Lymphocytes Relative: 18 %
Lymphs Abs: 0.8 10*3/uL (ref 0.7–4.0)
MCH: 29.3 pg (ref 26.0–34.0)
MCHC: 30.8 g/dL (ref 30.0–36.0)
MCV: 95.1 fL (ref 80.0–100.0)
Monocytes Absolute: 1.3 10*3/uL — ABNORMAL HIGH (ref 0.1–1.0)
Monocytes Relative: 29 %
Neutro Abs: 2.4 10*3/uL (ref 1.7–7.7)
Neutrophils Relative %: 52 %
Platelet Count: 77 10*3/uL — ABNORMAL LOW (ref 150–400)
RBC: 4.47 MIL/uL (ref 4.22–5.81)
RDW: 15.6 % — ABNORMAL HIGH (ref 11.5–15.5)
WBC Count: 4.5 10*3/uL (ref 4.0–10.5)
nRBC: 0 % (ref 0.0–0.2)

## 2019-08-23 LAB — LACTATE DEHYDROGENASE: LDH: 176 U/L (ref 98–192)

## 2019-08-23 NOTE — Progress Notes (Signed)
[place] OFFICE PROGRESS NOTE  Patient Care Team: [name], MD as PCP - General (Internal Medicine) Burnell Blanks, MD as PCP - Cardiology (Cardiology)  HEME/ONC OVERVIEW: 1. Thrombocytopenia -Likely due to Imuran and underlying liver disease  -Plts 107k on last known CBC in 2017; stable ~60-80ks since late 2019  Coags, nutritional and infectious w/u unremarkable   Increased echogenicity of liver on abd [place]  -On observation   PERTINENT NON-HEM/ONC PROBLEMS: 1. Ulcerative colitis on Imuran since 2002   ASSESSMENT & PLAN:   Thrombocytopenia -Likely multifactorial, including immunosuppression with Imuran and underlying liver disease -Extensive work-up, including serologic studies, was unremarkable; mild increased echogenicity of the liver on abdominal US, suggesting some liver disease  -Clinically, patient denies any constitutional symptoms or symptoms of bleeding  -Plts 77k today, improving -Given the slightly increased monocyte percentage, I have ordered flow cytometry to rule out monoclonal populations  -As his thrombocytopenia has been relatively stable since 2017 in the setting of chronic, ongoing immunosuppression, and he does not have any clinically suspicious symptoms, bone marrow biopsy would unlikely change the management -We will plan to monitor his CBC q58month for any interval changes -However, if he develops symptoms of bleeding/excess bruising, severe thrombocytopenia (plts < 50k) or other progressive cytopenias, it would be reasonable to perform bone marrow biopsy to rule out other disorders  -Patient was also offered the option to monitor CBC periodically with PCP, but he preferred hematology follow-up   CKD -Hx of left nephrectomy for RCC; baseline Cr ~1.3 -Cr 1.28 today, stable -I counseled the patient on the importance of maintaining adequate hydration and avoiding nephrotoxic medication, such as NSAIDs  Ulcerative  colitis -Currently well controlled -Imuran 50 mg daily since 07/2018 (previously 75 mg daily for many years) -No symptoms of UC flare -Continue follow-up with gastroenterology  Orders Placed This Encounter  Procedures  . CBC with Differential (Cancer Center Only)    Standing Status:   Future    Standing Expiration Date:   09/26/2020  . CMP ([facility])    Standing Status:   Future    Standing Expiration Date:   09/26/2020   The total time spent in the encounter was 35 minutes, including face-to-face time with the patient, review of various tests results, order additional studies/medications, documentation, and coordination of care plan.   All questions were answered. The patient knows to call the clinic with any problems, questions or concerns. No barriers to learning was detected.  Return in 6 months for labs and clinic follow-up.  YTish Men MD 4/12/202111:33 AM  CHIEF COMPLAINT: "I am doing fine"  INTERVAL HISTORY: Mr. HChoereturns clinic for follow-up of chronic thrombocytopenia.  Patient reports that he has been doing well since last visit, and denies any new symptoms, such as fever, night sweats, or lymphadenopathy.  His wife has been trying to lose weight due to being a prediabetic, and he has lost a few pounds with her via dietary changes.  He denies any complaint today.  REVIEW OF SYSTEMS:   Constitutional: ( - ) fevers, ( - )  chills , ( - ) night sweats Eyes: ( - ) blurriness of vision, ( - ) double vision, ( - ) watery eyes Ears, nose, mouth, throat, and face: ( - ) mucositis, ( - ) sore throat Respiratory: ( - ) cough, ( - ) dyspnea, ( - ) wheezes Cardiovascular: ( - ) palpitation, ( - ) chest discomfort, ( - ) lower extremity  swelling Gastrointestinal:  ( - ) nausea, ( - ) heartburn, ( - ) change in bowel habits Skin: ( - ) abnormal skin rashes Lymphatics: ( - ) new lymphadenopathy, ( - ) easy bruising Neurological: ( - ) numbness, ( - ) tingling, ( - ) new  weaknesses Behavioral/Psych: ( - ) mood change, ( - ) new changes  All other systems were reviewed with the patient and are negative.  SUMMARY OF ONCOLOGIC HISTORY: Oncology History   No history exists.    I have reviewed the past medical history, past surgical history, social history and family history with the patient and they are unchanged from previous note.  ALLERGIES:  is allergic to itraconazole; other; and lipitor [atorvastatin].  MEDICATIONS:  Current Outpatient Medications  Medication Sig Dispense Refill  . aspirin EC 81 MG tablet Take 1 tablet (81 mg total) by mouth daily.    [name] azaTHIOprine (IMURAN) 50 MG tablet Take 50 mg by mouth every morning. Takes 1  1/2  TABS    . Cholecalciferol (VITAMIN D3) 1000 UNITS CAPS Take 2 capsules by mouth daily.     . Coenzyme Q10 (COQ10) 200 MG CAPS Take 1 capsule by mouth daily.    . dorzolamide-timolol (COSOPT) 22.3-6.8 MG/ML ophthalmic solution Place 1 drop into the right eye 3 (three) times daily.    . fish oil-omega-3 fatty acids 1000 MG capsule Take 1 g by mouth daily.     . fluoruracil (CARAC) 0.5 % cream Apply 1 application topically 2 (two) times daily.     . Ginkgo Biloba 40 MG TABS Take 120 mg by mouth daily.     [name] levothyroxine (SYNTHROID, LEVOTHROID) 50 MCG tablet Take 50-100 mcg by mouth daily before breakfast. Take 63mg everyday except Wednesday and Sunday take 1061m.    . mesalamine (APRISO) 0.375 G 24 hr capsule Take 2,250 mg by mouth daily. TAKES 6 TABS DAILY--  TOTAL 2250MG    . mesalamine (CANASA) 1000 MG suppository Place 1,000 mg rectally at bedtime.    . metroNIDAZOLE (METROGEL) 0.75 % gel Apply 1 application topically 2 (two) times daily as needed (Rosacea).     . Multiple Vitamins-Minerals (EQ COMPLETE MULTIVIT ADULT 50+ PO) Take 1 tablet by mouth daily.    . Netarsudil-Latanoprost (ROCKLATAN) 0.02-0.005 % SOLN Apply 1 drop to eye at bedtime.    . niacin (SLO-NIACIN) 500 MG tablet Take 500 mg by mouth at bedtime.     . simvastatin (ZOCOR) 20 MG tablet Take 10 mg by mouth every other day. TAKES QHS    . urea (CARMOL) 20 % cream Apply topically as needed.     No current facility-administered medications for this visit.    PHYSICAL EXAMINATION: ECOG PERFORMANCE STATUS: 1 - Symptomatic but completely ambulatory  Today's Vitals   08/23/19 1121  BP: (!) 153/59  Pulse: 62  Resp: 18  Temp: (!) 97.1 F (36.2 C)  TempSrc: Temporal  SpO2: 100%  Weight: 170 lb (77.1 kg)  Height: 6' 2"  (1.88 m)  PainSc: 0-No pain   Body mass index is 21.83 kg/m.  Filed Weights   08/23/19 1121  Weight: 170 lb (77.1 kg)    GENERAL: alert, no distress and comfortable SKIN: skin color, texture, turgor are normal, no rashes or significant lesions EYES: conjunctiva are pink and non-injected, sclera clear OROPHARYNX: no exudate, no erythema; lips, buccal mucosa, and tongue normal  NECK: supple, non-tender LUNGS: clear to auscultation with normal breathing effort HEART: regular rate & rhythm and  no murmurs and no lower extremity edema ABDOMEN: soft, non-tender, non-distended, normal bowel sounds Musculoskeletal: no cyanosis of digits and no clubbing  PSYCH: alert & oriented x 3, fluent speech  LABORATORY DATA:  I have reviewed the data as listed    Component Value Date/Time   NA 145 08/23/2019 1015   K 4.6 08/23/2019 1015   CL 110 08/23/2019 1015   CO2 29 08/23/2019 1015   GLUCOSE 88 08/23/2019 1015   BUN 23 08/23/2019 1015   CREATININE 1.28 (H) 08/23/2019 1015   CALCIUM 9.9 08/23/2019 1015   PROT 6.8 08/23/2019 1015   ALBUMIN 4.5 08/23/2019 1015   AST 15 08/23/2019 1015   ALT 12 08/23/2019 1015   ALKPHOS 45 08/23/2019 1015   BILITOT 0.5 08/23/2019 1015   GFRNONAA 51 (L) 08/23/2019 1015   GFRAA 59 (L) 08/23/2019 1015    No results found for: SPEP, UPEP  Lab Results  Component Value Date   WBC 4.5 08/23/2019   NEUTROABS 2.4 08/23/2019   HGB 13.1 08/23/2019   HCT 42.5 08/23/2019   MCV 95.1  08/23/2019   PLT 77 (L) 08/23/2019      Chemistry      Component Value Date/Time   NA 145 08/23/2019 1015   K 4.6 08/23/2019 1015   CL 110 08/23/2019 1015   CO2 29 08/23/2019 1015   BUN 23 08/23/2019 1015   CREATININE 1.28 (H) 08/23/2019 1015      Component Value Date/Time   CALCIUM 9.9 08/23/2019 1015   ALKPHOS 45 08/23/2019 1015   AST 15 08/23/2019 1015   ALT 12 08/23/2019 1015   BILITOT 0.5 08/23/2019 1015       RADIOGRAPHIC STUDIES: I have personally reviewed the radiological images as listed below and agreed with the findings in the report. CUP PACEART REMOTE DEVICE CHECK  Result Date: 08/09/2019 Scheduled remote reviewed.  Normal device function.  Known PAF. 19 AMS episodes longest was 1mn 4 sec. all the rest were less than 5 mins. No OAC noted. Next remote 91 days.  L[place]ph[place]

## 2019-08-24 LAB — SURGICAL PATHOLOGY

## 2019-08-25 DIAGNOSIS — R310 Gross hematuria: Secondary | ICD-10-CM | POA: Diagnosis not present

## 2019-08-25 DIAGNOSIS — N401 Enlarged prostate with lower urinary tract symptoms: Secondary | ICD-10-CM | POA: Diagnosis not present

## 2019-08-25 LAB — FLOW CYTOMETRY

## 2019-09-01 DIAGNOSIS — E038 Other specified hypothyroidism: Secondary | ICD-10-CM | POA: Diagnosis not present

## 2019-10-04 DIAGNOSIS — Z86010 Personal history of colonic polyps: Secondary | ICD-10-CM | POA: Diagnosis not present

## 2019-10-04 DIAGNOSIS — K5150 Left sided colitis without complications: Secondary | ICD-10-CM | POA: Diagnosis not present

## 2019-10-06 DIAGNOSIS — R69 Illness, unspecified: Secondary | ICD-10-CM | POA: Diagnosis not present

## 2019-11-08 ENCOUNTER — Ambulatory Visit (INDEPENDENT_AMBULATORY_CARE_PROVIDER_SITE_OTHER): Payer: Medicare HMO | Admitting: *Deleted

## 2019-11-08 DIAGNOSIS — I442 Atrioventricular block, complete: Secondary | ICD-10-CM

## 2019-11-09 LAB — CUP PACEART REMOTE DEVICE CHECK
Battery Remaining Longevity: 103 mo
Battery Remaining Percentage: 95.5 %
Battery Voltage: 2.99 V
Brady Statistic AP VP Percent: 78 %
Brady Statistic AP VS Percent: 1 %
Brady Statistic AS VP Percent: 18 %
Brady Statistic AS VS Percent: 3.6 %
Brady Statistic RA Percent Paced: 78 %
Brady Statistic RV Percent Paced: 95 %
Date Time Interrogation Session: 20210628025017
Implantable Lead Implant Date: 20191226
Implantable Lead Implant Date: 20191226
Implantable Lead Location: 753859
Implantable Lead Location: 753860
Implantable Pulse Generator Implant Date: 20191226
Lead Channel Impedance Value: 460 Ohm
Lead Channel Impedance Value: 490 Ohm
Lead Channel Pacing Threshold Amplitude: 0.5 V
Lead Channel Pacing Threshold Amplitude: 0.75 V
Lead Channel Pacing Threshold Pulse Width: 0.5 ms
Lead Channel Pacing Threshold Pulse Width: 0.5 ms
Lead Channel Sensing Intrinsic Amplitude: 12 mV
Lead Channel Sensing Intrinsic Amplitude: 3 mV
Lead Channel Setting Pacing Amplitude: 2 V
Lead Channel Setting Pacing Amplitude: 2.5 V
Lead Channel Setting Pacing Pulse Width: 0.5 ms
Lead Channel Setting Sensing Sensitivity: 2 mV
Pulse Gen Model: 2272
Pulse Gen Serial Number: 9094310

## 2019-11-10 NOTE — Progress Notes (Signed)
Remote pacemaker transmission.   

## 2019-11-23 DIAGNOSIS — R310 Gross hematuria: Secondary | ICD-10-CM | POA: Diagnosis not present

## 2019-11-23 DIAGNOSIS — N401 Enlarged prostate with lower urinary tract symptoms: Secondary | ICD-10-CM | POA: Diagnosis not present

## 2019-11-23 DIAGNOSIS — R972 Elevated prostate specific antigen [PSA]: Secondary | ICD-10-CM | POA: Diagnosis not present

## 2019-11-23 DIAGNOSIS — R351 Nocturia: Secondary | ICD-10-CM | POA: Diagnosis not present

## 2019-12-08 DIAGNOSIS — D485 Neoplasm of uncertain behavior of skin: Secondary | ICD-10-CM | POA: Diagnosis not present

## 2019-12-08 DIAGNOSIS — C44222 Squamous cell carcinoma of skin of right ear and external auricular canal: Secondary | ICD-10-CM | POA: Diagnosis not present

## 2019-12-08 DIAGNOSIS — L570 Actinic keratosis: Secondary | ICD-10-CM | POA: Diagnosis not present

## 2019-12-08 DIAGNOSIS — Z85828 Personal history of other malignant neoplasm of skin: Secondary | ICD-10-CM | POA: Diagnosis not present

## 2019-12-14 DIAGNOSIS — C44222 Squamous cell carcinoma of skin of right ear and external auricular canal: Secondary | ICD-10-CM | POA: Diagnosis not present

## 2019-12-14 DIAGNOSIS — Z85828 Personal history of other malignant neoplasm of skin: Secondary | ICD-10-CM | POA: Diagnosis not present

## 2019-12-28 DIAGNOSIS — D485 Neoplasm of uncertain behavior of skin: Secondary | ICD-10-CM | POA: Diagnosis not present

## 2019-12-28 DIAGNOSIS — L708 Other acne: Secondary | ICD-10-CM | POA: Diagnosis not present

## 2020-01-21 DIAGNOSIS — G459 Transient cerebral ischemic attack, unspecified: Secondary | ICD-10-CM | POA: Diagnosis not present

## 2020-01-21 DIAGNOSIS — N401 Enlarged prostate with lower urinary tract symptoms: Secondary | ICD-10-CM | POA: Diagnosis not present

## 2020-01-21 DIAGNOSIS — E039 Hypothyroidism, unspecified: Secondary | ICD-10-CM | POA: Diagnosis not present

## 2020-01-21 DIAGNOSIS — E785 Hyperlipidemia, unspecified: Secondary | ICD-10-CM | POA: Diagnosis not present

## 2020-01-21 DIAGNOSIS — Z950 Presence of cardiac pacemaker: Secondary | ICD-10-CM | POA: Diagnosis not present

## 2020-01-21 DIAGNOSIS — D696 Thrombocytopenia, unspecified: Secondary | ICD-10-CM | POA: Diagnosis not present

## 2020-01-21 DIAGNOSIS — K5190 Ulcerative colitis, unspecified, without complications: Secondary | ICD-10-CM | POA: Diagnosis not present

## 2020-01-21 DIAGNOSIS — D692 Other nonthrombocytopenic purpura: Secondary | ICD-10-CM | POA: Diagnosis not present

## 2020-01-21 DIAGNOSIS — K219 Gastro-esophageal reflux disease without esophagitis: Secondary | ICD-10-CM | POA: Diagnosis not present

## 2020-01-21 DIAGNOSIS — R7301 Impaired fasting glucose: Secondary | ICD-10-CM | POA: Diagnosis not present

## 2020-01-25 DIAGNOSIS — Z85828 Personal history of other malignant neoplasm of skin: Secondary | ICD-10-CM | POA: Diagnosis not present

## 2020-01-25 DIAGNOSIS — L720 Epidermal cyst: Secondary | ICD-10-CM | POA: Diagnosis not present

## 2020-01-25 DIAGNOSIS — L821 Other seborrheic keratosis: Secondary | ICD-10-CM | POA: Diagnosis not present

## 2020-01-25 DIAGNOSIS — L570 Actinic keratosis: Secondary | ICD-10-CM | POA: Diagnosis not present

## 2020-01-25 DIAGNOSIS — D485 Neoplasm of uncertain behavior of skin: Secondary | ICD-10-CM | POA: Diagnosis not present

## 2020-01-25 DIAGNOSIS — L812 Freckles: Secondary | ICD-10-CM | POA: Diagnosis not present

## 2020-01-26 NOTE — Progress Notes (Signed)
Electrophysiology Office Note Date: 01/27/2020  ID:  Joseph Hanna, DOB 06-Jul-1932, MRN [id number]  PCP: [name], MD Primary Cardiologist: Angelena Form Electrophysiologist: Allred  CC: Pacemaker follow-up  Joseph Hanna is a 86 y.o. male seen today for Dr Rayann Heman.  He presents today for routine electrophysiology followup.  Since last being seen in our clinic, the patient reports doing very well.  He denies chest pain, palpitations, dyspnea, PND, orthopnea, nausea, vomiting, dizziness, syncope, edema, weight gain, or early satiety.  Device History: STJ dual chamber PPM implanted 2019 for Mobitz II    Past Medical History:  Diagnosis Date  . Arthritis   . BPH (benign prostatic hypertrophy)   . Cancer ([place])    kidney , skin cancer   . CKD (chronic kidney disease)   . Clavicle fracture    Right  . ED (erectile dysfunction) of organic origin   . Fatty liver 2020   [place]  . Gallstones 2020   [place]  . GERD (gastroesophageal reflux disease)   . Glaucoma    bilateral --  right uses rx drops and left eye trabeculectomy 2011  . Grade I diastolic dysfunction 00/17/4944   Noted on ECHO  . History of bradycardia   . History of gastroesophageal reflux (GERD)   . History of left bundle branch block (LBBB) 05/05/2018   Noted on EKG  . History of renal cell cancer    S/P  LEFT NEPHRECTOMY  2003--  no recurrence  . History of ulcerative colitis    in remission  . Hyperlipidemia   . Hypothyroidism   . Keratosis, actinic   . 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 LVH (left ventricular hypertrophy) 04/21/2018   Mode, noted on ECHO  . Mobitz II 05/05/2018   Noted on EKG  . Presence of permanent cardiac pacemaker   . Rosacea   . Thrombocytopenia ([place])   . TIA (transient ischemic attack) 06/20/2015  . TIA (transient ischemic attack)    2017    Past Surgical History:  Procedure Laterality Date  . bilateral knee meniscus surgery     .  CATARACT EXTRACTION W/ INTRAOCULAR LENS  IMPLANT, BILATERAL    . COLONOSCOPY  FEB 2015  . GREEN LIGHT LASER TURP (TRANSURETHRAL RESECTION OF PROSTATE N/A 08/10/2013   Procedure: GREEN LIGHT LASER TURP (TRANSURETHRAL RESECTION OF PROSTATE;  Surgeon: [name], MD;  Location: [facility];  Service: Urology;  Laterality: N/A;  . [place]  . MOHS SURGERY     x 2  . NEPHRECTOMY Left 2003  . ORIF CLAVICULAR FRACTURE  12/03/2011   Procedure: OPEN REDUCTION INTERNAL FIXATION (ORIF) CLAVICULAR FRACTURE;  Surgeon: [name], MD;  Location: [place];  Service: Orthopedics;  Laterality: Right;  . PACEMAKER IMPLANT N/A 05/07/2018    [employer] Jude [employer]urity MRI conditional  dual-chamber pacemaker by Dr Rayann Heman for mobitz II second degree AV block  . right shoulder surgery      done at [facility]   . SHOULDER ARTHROSCOPY Left LEFT  2012  . TONSILLECTOMY  as child  . TRABECULECTOMY  2011   left eye (glaucoma)  . TRANSURETHRAL RESECTION OF PROSTATE N/A 07/24/2018   Procedure: TRANSURETHRAL RESECTION OF THE PROSTATE (TURP);  Surgeon: Festus Aloe, MD;  Location: [facility];  Service: Urology;  Laterality: N/A;    Current Outpatient Medications  Medication Sig Dispense Refill  . aspirin  EC 81 MG tablet Take 1 tablet (81 mg total) by mouth daily.    [name] azaTHIOprine (IMURAN) 50 MG tablet Take 50 mg by mouth every morning. Takes 1  1/2  TABS    . Cholecalciferol (VITAMIN D3) 1000 UNITS CAPS Take 2 capsules by mouth daily.     . Coenzyme Q10 (COQ10) 200 MG CAPS Take 1 capsule by mouth daily.    . dorzolamide-timolol (COSOPT) 22.3-6.8 MG/ML ophthalmic solution Place 1 drop into the right eye 3 (three) times daily.    . finasteride (PROSCAR) 5 MG tablet Take 5 mg by mouth daily.    . fish oil-omega-3 fatty acids 1000 MG capsule Take 1 g by mouth daily.     . fluoruracil (CARAC) 0.5 % cream Apply 1 application topically 2 (two) times daily.     .  Ginkgo Biloba 40 MG TABS Take 120 mg by mouth daily.     [name] levothyroxine (SYNTHROID, LEVOTHROID) 50 MCG tablet Take 50-100 mcg by mouth daily before breakfast. Take 38mg everyday except Wednesday and Sunday take 1030m.    . mesalamine (APRISO) 0.375 G 24 hr capsule Take 2,250 mg by mouth daily. TAKES 6 TABS DAILY--  TOTAL 2250MG    . mesalamine (CANASA) 1000 MG suppository Place 1,000 mg rectally at bedtime.    . metroNIDAZOLE (METROGEL) 0.75 % gel Apply 1 application topically 2 (two) times daily as needed (Rosacea).     . Multiple Vitamins-Minerals (EQ COMPLETE MULTIVIT ADULT 50+ PO) Take 1 tablet by mouth daily.    . Netarsudil-Latanoprost (ROCKLATAN) 0.02-0.005 % SOLN Apply 1 drop to eye at bedtime.    . niacin (SLO-NIACIN) 500 MG tablet Take 500 mg by mouth at bedtime.    . simvastatin (ZOCOR) 20 MG tablet Take 10 mg by mouth every other day. TAKES QHS    . urea (CARMOL) 20 % cream Apply topically as needed.     No current facility-administered medications for this visit.    Allergies:   Itraconazole, Other, and Lipitor [atorvastatin]   Social History: Social History   Socioeconomic History  . Marital status: Married    Spouse name: Not on file  . Number of children: 4  . Years of education: Not on file  . Highest education level: Not on file  Occupational History  . Occupation: Retired-Salesman  Tobacco Use  . Smoking status: Former Smoker    Years: 6.00    Types: Cigarettes    Quit date: 12/01/1953    Years since quitting: 66.2  . Smokeless tobacco: Never Used  Vaping Use  . Vaping Use: Never used  Substance and Sexual Activity  . Alcohol use: Yes    Alcohol/week: 7.0 standard drinks    Types: 7 Glasses of wine per week    Comment: glass of wine several days per week  . Drug use: No  . Sexual activity: Not on file  Other Topics Concern  . Not on file  Social History Narrative  . Not on file   Social Determinants of Health   Financial Resource Strain:   .  Difficulty of Paying Living Expenses: Not on file  Food Insecurity:   . Worried About RuCharity fundraisern the Last Year: Not on file  . Ran Out of Food in the Last Year: Not on file  Transportation Needs:   . Lack of Transportation (Medical): Not on file  . Lack of Transportation (Non-Medical): Not on file  Physical Activity:   . Days of  Exercise per Week: Not on file  . Minutes of Exercise per Session: Not on file  Stress:   . Feeling of Stress : Not on file  Social Connections:   . Frequency of Communication with Friends and Family: Not on file  . Frequency of Social Gatherings with Friends and Family: Not on file  . Attends Religious Services: Not on file  . Active Member of Clubs or Organizations: Not on file  . Attends Archivist Meetings: Not on file  . Marital Status: Not on file  Intimate Partner Violence:   . Fear of Current or Ex-Partner: Not on file  . Emotionally Abused: Not on file  . Physically Abused: Not on file  . Sexually Abused: Not on file    Family History: Family History  Problem Relation Age of Onset  . Breast cancer Mother   . Emphysema Father   . Alzheimer's disease Brother      Review of Systems: All other systems reviewed and are otherwise negative except as noted above.   Physical Exam: VS:  BP 118/62   Pulse 66   Ht 6' 2"  (1.88 m)   Wt 168 lb (76.2 kg)   SpO2 98%   BMI 21.57 kg/m  , BMI Body mass index is 21.57 kg/m.  GEN- The patient is well appearing, alert and oriented x 3 today.   HEENT: normocephalic, atraumatic; sclera clear, conjunctiva pink; hearing intact; oropharynx clear; neck supple  Lungs- Clear to ausculation bilaterally, normal work of breathing.  No wheezes, rales, rhonchi Heart- Regular rate and rhythm, no murmurs, rubs or gallops  GI- soft, non-tender, non-distended, bowel sounds present  Extremities- no clubbing, cyanosis, or edema  MS- no significant deformity or atrophy Skin- warm and dry, no rash  or lesion; PPM pocket well healed Psych- euthymic mood, full affect Neuro- strength and sensation are intact  PPM Interrogation- reviewed in detail today,  See PACEART report  EKG:  EKG is not ordered today.  Recent Labs: 08/23/2019: ALT 12; BUN 23; Creatinine 1.28; Hemoglobin 13.1; Platelet Count 77; Potassium 4.6; Sodium 145   Wt Readings from Last 3 Encounters:  01/27/20 168 lb (76.2 kg)  08/23/19 170 lb (77.1 kg)  05/25/19 173 lb (78.5 kg)     Other studies Reviewed: Additional studies/ records that were reviewed today include: Dr Jackalyn Lombard office notes   Assessment and Plan:  1.  Complete heart block Normal PPM function See Pace Art report No changes today Pt is dependent today  2.  Paroxysmal atrial fibrillation Longest episode by device interrogation 1 hour If AF burden increases, will consider [place]  3.  HTN Stable No change required today  4.  Prior TIA Per Dr Jackalyn Lombard last note, would be in favor of stopping ASA and using Eliquis if platelets improve.  Followed by hematology   Current medicines are reviewed at length with the patient today.   The patient does not have concerns regarding his medicines.  The following changes were made today:  none  Labs/ tests ordered today include: none No orders of the defined types were placed in this encounter.    Disposition:   Follow up with remotes, Dr Rayann Heman 1 year    Signed, Chanetta Marshall, NP 01/27/2020 12:16 PM  [address] [place] [id number] [phone] (office) [phone] (fax)

## 2020-01-27 ENCOUNTER — Other Ambulatory Visit: Payer: Self-pay

## 2020-01-27 ENCOUNTER — Ambulatory Visit: Payer: Medicare HMO | Admitting: Nurse Practitioner

## 2020-01-27 VITALS — BP 118/62 | HR 66 | Ht 74.0 in | Wt 168.0 lb

## 2020-01-27 DIAGNOSIS — I10 Essential (primary) hypertension: Secondary | ICD-10-CM | POA: Diagnosis not present

## 2020-01-27 DIAGNOSIS — G459 Transient cerebral ischemic attack, unspecified: Secondary | ICD-10-CM | POA: Diagnosis not present

## 2020-01-27 DIAGNOSIS — I480 Paroxysmal atrial fibrillation: Secondary | ICD-10-CM | POA: Diagnosis not present

## 2020-01-27 DIAGNOSIS — I442 Atrioventricular block, complete: Secondary | ICD-10-CM

## 2020-01-27 NOTE — Patient Instructions (Signed)
Medication Instructions:  *If you need a refill on your cardiac medications before your next appointment, please call your pharmacy*  Follow-Up: At [facility], you and your health needs are our priority.  As part of our continuing mission to provide you with exceptional heart care, we have created designated Provider Care Teams.  These Care Teams include your primary Cardiologist (physician) and Advanced Practice Providers (APPs -  Physician Assistants and Nurse Practitioners) who all work together to provide you with the care you need, when you need it.  We recommend signing up for the patient portal called "MyChart".  Sign up information is provided on this After Visit Summary.  MyChart is used to connect with patients for Virtual Visits (Telemedicine).  Patients are able to view lab/test results, encounter notes, upcoming appointments, etc.  Non-urgent messages can be sent to your provider as well.   To learn more about what you can do with MyChart, go to [email or web address].    Your next appointment:   Your physician wants you to follow-up in: 1 YEAR with Dr. Rayann Heman. You will receive a reminder letter in the mail two months in advance. If you don't receive a letter, please call our office to schedule the follow-up appointment.  Remote monitoring is used to monitor your Pacemaker from home. This monitoring reduces the number of office visits required to check your device to one time per year. It allows [place] to keep an eye on the functioning of your device to ensure it is working properly. You are scheduled for a device check from home on 02/07/20. You may send your transmission at any time that day. If you have a wireless device, the transmission will be sent automatically. After your physician reviews your transmission, you will receive a postcard with your next transmission date.  The format for your next appointment:   In Person with Dr. Rayann Heman

## 2020-02-01 DIAGNOSIS — H401133 Primary open-angle glaucoma, bilateral, severe stage: Secondary | ICD-10-CM | POA: Diagnosis not present

## 2020-02-07 ENCOUNTER — Other Ambulatory Visit: Payer: Self-pay

## 2020-02-07 ENCOUNTER — Encounter (INDEPENDENT_AMBULATORY_CARE_PROVIDER_SITE_OTHER): Payer: Medicare HMO | Admitting: Ophthalmology

## 2020-02-07 ENCOUNTER — Ambulatory Visit (INDEPENDENT_AMBULATORY_CARE_PROVIDER_SITE_OTHER): Payer: Medicare HMO | Admitting: Emergency Medicine

## 2020-02-07 DIAGNOSIS — H43813 Vitreous degeneration, bilateral: Secondary | ICD-10-CM | POA: Diagnosis not present

## 2020-02-07 DIAGNOSIS — I442 Atrioventricular block, complete: Secondary | ICD-10-CM | POA: Diagnosis not present

## 2020-02-07 DIAGNOSIS — H33302 Unspecified retinal break, left eye: Secondary | ICD-10-CM

## 2020-02-07 DIAGNOSIS — H35372 Puckering of macula, left eye: Secondary | ICD-10-CM | POA: Diagnosis not present

## 2020-02-08 DIAGNOSIS — Z1159 Encounter for screening for other viral diseases: Secondary | ICD-10-CM | POA: Diagnosis not present

## 2020-02-08 LAB — CUP PACEART REMOTE DEVICE CHECK
Battery Remaining Longevity: 101 mo
Battery Remaining Percentage: 95.5 %
Battery Voltage: 2.99 V
Brady Statistic AP VP Percent: 78 %
Brady Statistic AP VS Percent: 1 %
Brady Statistic AS VP Percent: 21 %
Brady Statistic AS VS Percent: 1 %
Brady Statistic RA Percent Paced: 77 %
Brady Statistic RV Percent Paced: 99 %
Date Time Interrogation Session: 20210927032934
Implantable Lead Implant Date: 20191226
Implantable Lead Implant Date: 20191226
Implantable Lead Location: 753859
Implantable Lead Location: 753860
Implantable Pulse Generator Implant Date: 20191226
Lead Channel Impedance Value: 430 Ohm
Lead Channel Impedance Value: 490 Ohm
Lead Channel Pacing Threshold Amplitude: 0.5 V
Lead Channel Pacing Threshold Amplitude: 1 V
Lead Channel Pacing Threshold Pulse Width: 0.5 ms
Lead Channel Pacing Threshold Pulse Width: 0.5 ms
Lead Channel Sensing Intrinsic Amplitude: 12 mV
Lead Channel Sensing Intrinsic Amplitude: 3.3 mV
Lead Channel Setting Pacing Amplitude: 2 V
Lead Channel Setting Pacing Amplitude: 2.5 V
Lead Channel Setting Pacing Pulse Width: 0.5 ms
Lead Channel Setting Sensing Sensitivity: 2 mV
Pulse Gen Model: 2272
Pulse Gen Serial Number: 9094310

## 2020-02-09 NOTE — Progress Notes (Signed)
Remote pacemaker transmission.   

## 2020-02-11 DIAGNOSIS — K635 Polyp of colon: Secondary | ICD-10-CM | POA: Diagnosis not present

## 2020-02-11 DIAGNOSIS — Z8719 Personal history of other diseases of the digestive system: Secondary | ICD-10-CM | POA: Diagnosis not present

## 2020-02-11 DIAGNOSIS — K6389 Other specified diseases of intestine: Secondary | ICD-10-CM | POA: Diagnosis not present

## 2020-02-11 DIAGNOSIS — D123 Benign neoplasm of transverse colon: Secondary | ICD-10-CM | POA: Diagnosis not present

## 2020-02-11 DIAGNOSIS — D122 Benign neoplasm of ascending colon: Secondary | ICD-10-CM | POA: Diagnosis not present

## 2020-02-11 DIAGNOSIS — D124 Benign neoplasm of descending colon: Secondary | ICD-10-CM | POA: Diagnosis not present

## 2020-02-11 DIAGNOSIS — K5730 Diverticulosis of large intestine without perforation or abscess without bleeding: Secondary | ICD-10-CM | POA: Diagnosis not present

## 2020-02-11 DIAGNOSIS — Z86010 Personal history of colonic polyps: Secondary | ICD-10-CM | POA: Diagnosis not present

## 2020-02-11 DIAGNOSIS — K5140 Inflammatory polyps of colon without complications: Secondary | ICD-10-CM | POA: Diagnosis not present

## 2020-02-15 DIAGNOSIS — K635 Polyp of colon: Secondary | ICD-10-CM | POA: Diagnosis not present

## 2020-02-15 DIAGNOSIS — D123 Benign neoplasm of transverse colon: Secondary | ICD-10-CM | POA: Diagnosis not present

## 2020-02-15 DIAGNOSIS — D122 Benign neoplasm of ascending colon: Secondary | ICD-10-CM | POA: Diagnosis not present

## 2020-02-15 DIAGNOSIS — D124 Benign neoplasm of descending colon: Secondary | ICD-10-CM | POA: Diagnosis not present

## 2020-02-22 ENCOUNTER — Other Ambulatory Visit: Payer: Self-pay

## 2020-02-22 ENCOUNTER — Other Ambulatory Visit: Payer: Self-pay | Admitting: Family

## 2020-02-22 ENCOUNTER — Encounter: Payer: Self-pay | Admitting: Family

## 2020-02-22 ENCOUNTER — Inpatient Hospital Stay (HOSPITAL_BASED_OUTPATIENT_CLINIC_OR_DEPARTMENT_OTHER): Payer: Medicare HMO | Admitting: Family

## 2020-02-22 ENCOUNTER — Inpatient Hospital Stay: Payer: Medicare HMO | Attending: Hematology & Oncology

## 2020-02-22 VITALS — BP 157/68 | HR 60 | Temp 98.1°F | Resp 18 | Ht 74.0 in | Wt 168.0 lb

## 2020-02-22 DIAGNOSIS — M545 Low back pain, unspecified: Secondary | ICD-10-CM | POA: Insufficient documentation

## 2020-02-22 DIAGNOSIS — D696 Thrombocytopenia, unspecified: Secondary | ICD-10-CM

## 2020-02-22 DIAGNOSIS — Z905 Acquired absence of kidney: Secondary | ICD-10-CM | POA: Diagnosis not present

## 2020-02-22 DIAGNOSIS — Z86010 Personal history of colonic polyps: Secondary | ICD-10-CM | POA: Insufficient documentation

## 2020-02-22 DIAGNOSIS — Z7982 Long term (current) use of aspirin: Secondary | ICD-10-CM | POA: Insufficient documentation

## 2020-02-22 DIAGNOSIS — K769 Liver disease, unspecified: Secondary | ICD-10-CM | POA: Diagnosis not present

## 2020-02-22 DIAGNOSIS — M1990 Unspecified osteoarthritis, unspecified site: Secondary | ICD-10-CM | POA: Diagnosis not present

## 2020-02-22 DIAGNOSIS — Z950 Presence of cardiac pacemaker: Secondary | ICD-10-CM | POA: Insufficient documentation

## 2020-02-22 DIAGNOSIS — Z85828 Personal history of other malignant neoplasm of skin: Secondary | ICD-10-CM | POA: Insufficient documentation

## 2020-02-22 DIAGNOSIS — Z8553 Personal history of malignant neoplasm of renal pelvis: Secondary | ICD-10-CM | POA: Insufficient documentation

## 2020-02-22 DIAGNOSIS — Z79899 Other long term (current) drug therapy: Secondary | ICD-10-CM | POA: Insufficient documentation

## 2020-02-22 DIAGNOSIS — R200 Anesthesia of skin: Secondary | ICD-10-CM | POA: Insufficient documentation

## 2020-02-22 DIAGNOSIS — D6959 Other secondary thrombocytopenia: Secondary | ICD-10-CM | POA: Diagnosis not present

## 2020-02-22 LAB — CMP (CANCER CENTER ONLY)
ALT: 10 U/L (ref 0–44)
AST: 13 U/L — ABNORMAL LOW (ref 15–41)
Albumin: 4.3 g/dL (ref 3.5–5.0)
Alkaline Phosphatase: 36 U/L — ABNORMAL LOW (ref 38–126)
Anion gap: 5 (ref 5–15)
BUN: 24 mg/dL — ABNORMAL HIGH (ref 8–23)
CO2: 28 mmol/L (ref 22–32)
Calcium: 9.9 mg/dL (ref 8.9–10.3)
Chloride: 107 mmol/L (ref 98–111)
Creatinine: 1.28 mg/dL — ABNORMAL HIGH (ref 0.61–1.24)
GFR, Estimated: 51 mL/min — ABNORMAL LOW (ref >60)
Glucose, Bld: 95 mg/dL (ref 70–99)
Potassium: 4.7 mmol/L (ref 3.5–5.1)
Sodium: 140 mmol/L (ref 135–145)
Total Bilirubin: 0.5 mg/dL (ref 0.3–1.2)
Total Protein: 6.7 g/dL (ref 6.5–8.1)

## 2020-02-22 LAB — CBC WITH DIFFERENTIAL (CANCER CENTER ONLY)
Abs Immature Granulocytes: 0.04 10*3/uL (ref 0.00–0.07)
Basophils Absolute: 0 10*3/uL (ref 0.0–0.1)
Basophils Relative: 1 %
Eosinophils Absolute: 0 10*3/uL (ref 0.0–0.5)
Eosinophils Relative: 1 %
HCT: 40.2 % (ref 39.0–52.0)
Hemoglobin: 12.7 g/dL — ABNORMAL LOW (ref 13.0–17.0)
Immature Granulocytes: 1 %
Lymphocytes Relative: 16 %
Lymphs Abs: 0.9 10*3/uL (ref 0.7–4.0)
MCH: 30.5 pg (ref 26.0–34.0)
MCHC: 31.6 g/dL (ref 30.0–36.0)
MCV: 96.6 fL (ref 80.0–100.0)
Monocytes Absolute: 1.9 10*3/uL — ABNORMAL HIGH (ref 0.1–1.0)
Monocytes Relative: 33 %
Neutro Abs: 2.8 10*3/uL (ref 1.7–7.7)
Neutrophils Relative %: 48 %
Platelet Count: 59 10*3/uL — ABNORMAL LOW (ref 150–400)
RBC: 4.16 MIL/uL — ABNORMAL LOW (ref 4.22–5.81)
RDW: 13.9 % (ref 11.5–15.5)
WBC Count: 5.6 10*3/uL (ref 4.0–10.5)
nRBC: 0 % (ref 0.0–0.2)

## 2020-02-22 NOTE — Progress Notes (Signed)
Hematology and Oncology Follow Up Visit  Joseph Hanna [id number] 09-Sep-1932 86 y.o. 02/22/2020   Principle Diagnosis:  Thrombocytopenia secondary to underlying liver disease and Imuran (for UC)  Current Therapy:   Observation   Interim History:  Joseph Hanna is here today for follow-up. He is doing well and has no complaints at this time.  His platelet count today is 59 today. Hgb is 12.7, MCV 96, WBC count 5.6.  No episodes of abnormal bleeding. No petechiae noted on exam.  He states that since being on prednisone for around 6 months prior to his left kidney removal for renal cell carcinoma he has had thin skin and easy bruising on his arms.   He states that he did not have chemo or radiation for the kidney cancer.  He had a colonoscopy 2 weeks ago with Dr. Manya Silvas and states that he had 10 polyps removed but has not gotten the pathology report back yet.  No fever, chills, n/v, cough, rash, dizziness, headache, blurred/loss of vision, SOB, chest pain, palpitations, abdominal pain or changes in bowel or bladder habits.  No hot flashes or night sweats.  He does have a pacemaker.  He has chronic lower back pain due to arthritis.  Due to an old sciatic nerve issue he has numbness in the left foot.  No falls or syncopal episodes to report.  He sees his dermatologist, Dr. Jarome Matin with [facility], every 3 months to have spots monitored and treated when needed. He has history of skin cancer and states that he has had 3-4 Moh's surgeries on his face and neck. 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 He uses CANSA cream on his face every year of so and will restart on 02/25/2020 for 2 weeks.   He enjoys being outside and working in his yard. He also works out at [employer] 3 days a week.  He has maintained a good appetite and is staying well hydrated. His weight is stable.   ECOG  Performance Status: 1 - Symptomatic but completely ambulatory  Medications:  Allergies as of 02/22/2020      Reactions   Itraconazole Rash   Other Rash   Lipitor [atorvastatin] Other (See Comments)   "severe muscle aches"      Medication List       Accurate as of February 22, 2020  3:01 PM. If you have any questions, ask your nurse or doctor.        aspirin EC 81 MG tablet Take 1 tablet (81 mg total) by mouth daily.   azaTHIOprine 50 MG tablet Commonly known as: IMURAN Take 50 mg by mouth every morning. Takes 1  1/2  TABS   CoQ10 200 MG Caps Take 1 capsule by mouth daily.   dorzolamide-timolol 22.3-6.8 MG/ML ophthalmic solution Commonly known as: COSOPT Place 1 drop into the right eye 3 (three) times daily.   EQ COMPLETE MULTIVIT ADULT 50+ PO Take 1 tablet by mouth daily.   finasteride 5 MG tablet Commonly known as: PROSCAR Take 5 mg by mouth daily.   fish oil-omega-3 fatty acids 1000 MG capsule Take 1 g by mouth daily.   fluoruracil 0.5 % cream Commonly known as: CARAC Apply 1 application topically 2 (two) times daily.   Ginkgo Biloba 40 MG Tabs Take 120 mg by mouth daily.   levothyroxine 50 MCG tablet Commonly known as: SYNTHROID  Take 50-100 mcg by mouth daily before breakfast. Take 9mg everyday except Wednesday and Sunday take 1030m.   mesalamine 0.375 g 24 hr capsule Commonly known as: APRISO Take 2,250 mg by mouth daily. TAKES 6 TABS DAILY--  TOTAL 2250MG   mesalamine 1000 MG suppository Commonly known as: CANASA Place 1,000 mg rectally at bedtime.   metroNIDAZOLE 0.75 % gel Commonly known as: METROGEL Apply 1 application topically 2 (two) times daily as needed (Rosacea).   niacin 500 MG tablet Commonly known as: SLO-NIACIN Take 500 mg by mouth at bedtime.   Rocklatan 0.02-0.005 % Soln Generic drug: Netarsudil-Latanoprost Apply 1 drop to eye at bedtime.   simvastatin 20 MG tablet Commonly known as: ZOCOR Take 10 mg by mouth every other  day. TAKES QHS   urea 20 % cream Commonly known as: CARMOL Apply topically as needed.   Vitamin D3 25 MCG (1000 UT) Caps Take 2 capsules by mouth daily.       Allergies:  Allergies  Allergen Reactions  . Itraconazole Rash  . Other Rash  . Lipitor [Atorvastatin] Other (See Comments)    "severe muscle aches"    Past Medical History, Surgical history, Social history, and Family History were reviewed and updated.  Review of Systems: All other 10 point review of systems is negative.   Physical Exam:  height is 6' 2"  (1.88 m) and weight is 168 lb (76.2 kg). His oral temperature is 98.1 F (36.7 C). His blood pressure is 157/68 (abnormal) and his pulse is 60. His respiration is 18 and oxygen saturation is 100%.   Wt Readings from Last 3 Encounters:  02/22/20 168 lb (76.2 kg)  01/27/20 168 lb (76.2 kg)  08/23/19 170 lb (77.1 kg)    Ocular: Sclerae unicteric, pupils equal, round and reactive to light Ear-nose-throat: Oropharynx clear, dentition fair Lymphatic: No cervical or supraclavicular adenopathy Lungs no rales or rhonchi, good excursion bilaterally Heart regular rate and rhythm, no murmur appreciated Abd soft, nontender, positive bowel sounds, no liver or spleen tip palpated on exam, no fluid wave  MSK no focal spinal tenderness, no joint edema Neuro: non-focal, well-oriented, appropriate affect Breasts: Deferred   Lab Results  Component Value Date   WBC 5.6 02/22/2020   HGB 12.7 (L) 02/22/2020   HCT 40.2 02/22/2020   MCV 96.6 02/22/2020   PLT 59 (L) 02/22/2020   Lab Results  Component Value Date   FERRITIN 70 05/25/2019   IRON 96 05/25/2019   TIBC 308 05/25/2019   UIBC 212 05/25/2019   IRONPCTSAT 31 05/25/2019   Lab Results  Component Value Date   RBC 4.16 (L) 02/22/2020   No results found for: KPAFRELGTCHN, LAMBDASER, KAPLAMBRATIO No results found for: IGGSERUM, IGA, IGMSERUM No results found for: TORonnald RampA1GS, A2GS, BETS, BETA2SER,  GAMS, MSPIKE, SPEI   Chemistry      Component Value Date/Time   NA 140 02/22/2020 1021   K 4.7 02/22/2020 1021   CL 107 02/22/2020 1021   CO2 28 02/22/2020 1021   BUN 24 (H) 02/22/2020 1021   CREATININE 1.28 (H) 02/22/2020 1021      Component Value Date/Time   CALCIUM 9.9 02/22/2020 1021   ALKPHOS 36 (L) 02/22/2020 1021   AST 13 (L) 02/22/2020 1021   ALT 10 02/22/2020 1021   BILITOT 0.5 02/22/2020 1021       Impression and Plan: Mr. HeWachters a very pleasant [age] caucasian gentleman with history of thrombocytopenia since at least February 2017.  His counts continue to decrease, fluctuating at times.  His platelet count today is 59. I spoke with Dr. Marin Olp and he was able to review his blood smear. This showed some enlarged platelets which may indicate immune thrombocytopenia. For now we will continue to watch since he remains asymptomatic.  We will plan to see him again in 4 months.  He was encouraged to contact our office with any questions or concerns. We can certainly see him sooner if needed.   Laverna Peace, NP 10/12/20213:01 PM

## 2020-02-23 ENCOUNTER — Telehealth: Payer: Self-pay | Admitting: Family

## 2020-02-23 DIAGNOSIS — H26493 Other secondary cataract, bilateral: Secondary | ICD-10-CM | POA: Diagnosis not present

## 2020-02-23 DIAGNOSIS — H5212 Myopia, left eye: Secondary | ICD-10-CM | POA: Diagnosis not present

## 2020-02-23 DIAGNOSIS — H1849 Other corneal degeneration: Secondary | ICD-10-CM | POA: Diagnosis not present

## 2020-02-23 DIAGNOSIS — H1840 Unspecified corneal degeneration: Secondary | ICD-10-CM | POA: Diagnosis not present

## 2020-02-23 DIAGNOSIS — Z961 Presence of intraocular lens: Secondary | ICD-10-CM | POA: Diagnosis not present

## 2020-02-23 DIAGNOSIS — H5201 Hypermetropia, right eye: Secondary | ICD-10-CM | POA: Diagnosis not present

## 2020-02-23 DIAGNOSIS — H5053 Vertical heterophoria: Secondary | ICD-10-CM | POA: Diagnosis not present

## 2020-02-23 DIAGNOSIS — H52223 Regular astigmatism, bilateral: Secondary | ICD-10-CM | POA: Diagnosis not present

## 2020-02-23 DIAGNOSIS — H524 Presbyopia: Secondary | ICD-10-CM | POA: Diagnosis not present

## 2020-02-23 DIAGNOSIS — H401131 Primary open-angle glaucoma, bilateral, mild stage: Secondary | ICD-10-CM | POA: Diagnosis not present

## 2020-02-23 NOTE — Telephone Encounter (Signed)
Appointments scheduled calendar printed & mailed per 10/12 los

## 2020-02-29 DIAGNOSIS — Z0100 Encounter for examination of eyes and vision without abnormal findings: Secondary | ICD-10-CM | POA: Diagnosis not present

## 2020-03-28 DIAGNOSIS — R69 Illness, unspecified: Secondary | ICD-10-CM | POA: Diagnosis not present

## 2020-05-08 ENCOUNTER — Ambulatory Visit (INDEPENDENT_AMBULATORY_CARE_PROVIDER_SITE_OTHER): Payer: Medicare HMO

## 2020-05-08 DIAGNOSIS — I442 Atrioventricular block, complete: Secondary | ICD-10-CM | POA: Diagnosis not present

## 2020-05-08 LAB — CUP PACEART REMOTE DEVICE CHECK
Battery Remaining Longevity: 100 mo
Battery Remaining Percentage: 95.5 %
Battery Voltage: 2.99 V
Brady Statistic AP VP Percent: 78 %
Brady Statistic AP VS Percent: 1 %
Brady Statistic AS VP Percent: 21 %
Brady Statistic AS VS Percent: 1 %
Brady Statistic RA Percent Paced: 76 %
Brady Statistic RV Percent Paced: 99 %
Date Time Interrogation Session: 20211227020015
Implantable Lead Implant Date: 20191226
Implantable Lead Implant Date: 20191226
Implantable Lead Location: 753859
Implantable Lead Location: 753860
Implantable Pulse Generator Implant Date: 20191226
Lead Channel Impedance Value: 450 Ohm
Lead Channel Impedance Value: 460 Ohm
Lead Channel Pacing Threshold Amplitude: 0.5 V
Lead Channel Pacing Threshold Amplitude: 1 V
Lead Channel Pacing Threshold Pulse Width: 0.5 ms
Lead Channel Pacing Threshold Pulse Width: 0.5 ms
Lead Channel Sensing Intrinsic Amplitude: 12 mV
Lead Channel Sensing Intrinsic Amplitude: 3 mV
Lead Channel Setting Pacing Amplitude: 2 V
Lead Channel Setting Pacing Amplitude: 2.5 V
Lead Channel Setting Pacing Pulse Width: 0.5 ms
Lead Channel Setting Sensing Sensitivity: 2 mV
Pulse Gen Model: 2272
Pulse Gen Serial Number: 9094310

## 2020-05-11 ENCOUNTER — Emergency Department (HOSPITAL_BASED_OUTPATIENT_CLINIC_OR_DEPARTMENT_OTHER)
Admission: EM | Admit: 2020-05-11 | Discharge: 2020-05-11 | Disposition: A | Discharge status: Home / Self Care | Payer: Medicare HMO | Attending: Emergency Medicine | Admitting: Emergency Medicine

## 2020-05-11 ENCOUNTER — Emergency Department (HOSPITAL_BASED_OUTPATIENT_CLINIC_OR_DEPARTMENT_OTHER): Payer: Medicare HMO

## 2020-05-11 ENCOUNTER — Encounter (HOSPITAL_BASED_OUTPATIENT_CLINIC_OR_DEPARTMENT_OTHER): Payer: Self-pay | Admitting: *Deleted

## 2020-05-11 ENCOUNTER — Other Ambulatory Visit: Payer: Self-pay

## 2020-05-11 DIAGNOSIS — Z79899 Other long term (current) drug therapy: Secondary | ICD-10-CM | POA: Insufficient documentation

## 2020-05-11 DIAGNOSIS — N189 Chronic kidney disease, unspecified: Secondary | ICD-10-CM | POA: Insufficient documentation

## 2020-05-11 DIAGNOSIS — Z87891 Personal history of nicotine dependence: Secondary | ICD-10-CM | POA: Insufficient documentation

## 2020-05-11 DIAGNOSIS — R059 Cough, unspecified: Secondary | ICD-10-CM | POA: Diagnosis not present

## 2020-05-11 DIAGNOSIS — Z7982 Long term (current) use of aspirin: Secondary | ICD-10-CM | POA: Insufficient documentation

## 2020-05-11 DIAGNOSIS — M791 Myalgia, unspecified site: Secondary | ICD-10-CM | POA: Diagnosis not present

## 2020-05-11 DIAGNOSIS — E039 Hypothyroidism, unspecified: Secondary | ICD-10-CM | POA: Insufficient documentation

## 2020-05-11 DIAGNOSIS — Z85828 Personal history of other malignant neoplasm of skin: Secondary | ICD-10-CM | POA: Insufficient documentation

## 2020-05-11 DIAGNOSIS — R509 Fever, unspecified: Secondary | ICD-10-CM | POA: Diagnosis not present

## 2020-05-11 DIAGNOSIS — I130 Hypertensive heart and chronic kidney disease with heart failure and stage 1 through stage 4 chronic kidney disease, or unspecified chronic kidney disease: Secondary | ICD-10-CM | POA: Insufficient documentation

## 2020-05-11 DIAGNOSIS — I5030 Unspecified diastolic (congestive) heart failure: Secondary | ICD-10-CM | POA: Diagnosis not present

## 2020-05-11 DIAGNOSIS — Z950 Presence of cardiac pacemaker: Secondary | ICD-10-CM | POA: Diagnosis not present

## 2020-05-11 DIAGNOSIS — Z20822 Contact with and (suspected) exposure to covid-19: Secondary | ICD-10-CM | POA: Diagnosis not present

## 2020-05-11 DIAGNOSIS — R52 Pain, unspecified: Secondary | ICD-10-CM

## 2020-05-11 LAB — RAPID INFLUENZA A&B ANTIGENS
Influenza A (ARMC): NEGATIVE
Influenza B (ARMC): NEGATIVE

## 2020-05-11 MED ORDER — ACETAMINOPHEN 325 MG PO TABS
ORAL_TABLET | ORAL | Status: AC
  Filled 2020-05-11: qty 2

## 2020-05-11 MED ORDER — ACETAMINOPHEN 325 MG PO TABS
650.0000 mg | ORAL_TABLET | Freq: Once | ORAL | Status: AC
  Administered 2020-05-11: 650 mg via ORAL

## 2020-05-11 NOTE — ED Provider Notes (Signed)
[facility] EMERGENCY DEPARTMENT Provider Note   CSN: [id number] Arrival date & time: 05/11/20  1740     History Chief Complaint  Patient presents with  . Covid Exposure    Joseph Hanna is a 86 y.o. male.  HPI      Joseph Hanna is a 86 y.o. male, with a history of GERD, LBBB, TIA, presenting to the ED with generalized body aches for the last couple weeks. He states he had several family members over over the last week.  One of those family members began showing symptoms of Covid, however, has not had test results back yet. He has had Covid vaccination. He is requesting Covid testing. Denies fever/chills, cough, chest pain, shortness of breath, abdominal pain, urinary symptoms, headache, N/V/D, joint swelling, rash/wounds, or any other complaints.  Past Medical History:  Diagnosis Date  . Arthritis   . BPH (benign prostatic hypertrophy)   . Cancer ([place])    kidney , skin cancer   . CKD (chronic kidney disease)   . Clavicle fracture    Right  . ED (erectile dysfunction) of organic origin   . Fatty liver 2020   [place]  . Gallstones 2020   [place]  . GERD (gastroesophageal reflux disease)   . Glaucoma    bilateral --  right uses rx drops and left eye trabeculectomy 2011  . Grade I diastolic dysfunction 01/77/9390   Noted on ECHO  . History of bradycardia   . History of gastroesophageal reflux (GERD)   . History of left bundle branch block (LBBB) 05/05/2018   Noted on EKG  . History of renal cell cancer    S/P  LEFT NEPHRECTOMY  2003--  no recurrence  . History of ulcerative colitis    in remission  . Hyperlipidemia   . Hypothyroidism   . Keratosis, actinic   . 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 LVH (left ventricular hypertrophy) 04/21/2018   Mode, noted on ECHO  . Mobitz II 05/05/2018   Noted on EKG  . Presence of permanent cardiac pacemaker   . Rosacea   . Thrombocytopenia ([place])   . TIA (transient  ischemic attack) 06/20/2015  . TIA (transient ischemic attack)    2017     Patient Active Problem List   Diagnosis Date Noted  . Essential hypertension 07/25/2015  . Difficulty speaking 06/19/2015  . TIA (transient ischemic attack) 06/19/2015  . Thrombocytopenia ([place]) 06/19/2015  . CKD (chronic kidney disease) 06/19/2015  . BPH (benign prostatic hypertrophy)   . History of ulcerative colitis   . Hypothyroidism   . Hyperlipidemia   . History of renal cell cancer   . History of gastroesophageal reflux (GERD)     Past Surgical History:  Procedure Laterality Date  . bilateral knee meniscus surgery     . CATARACT EXTRACTION W/ INTRAOCULAR LENS  IMPLANT, BILATERAL    . COLONOSCOPY  FEB 2015  . GREEN LIGHT LASER TURP (TRANSURETHRAL RESECTION OF PROSTATE N/A 08/10/2013   Procedure: GREEN LIGHT LASER TURP (TRANSURETHRAL RESECTION OF PROSTATE;  Surgeon: [name], MD;  Location: [facility];  Service: Urology;  Laterality: N/A;  . [place]  . MOHS SURGERY     x 2  . NEPHRECTOMY Left 2003  . ORIF CLAVICULAR FRACTURE  12/03/2011   Procedure: OPEN REDUCTION INTERNAL FIXATION (ORIF) CLAVICULAR FRACTURE;  Surgeon: [name], MD;  Location: [facility];  Service: Orthopedics;  Laterality: Right;  . PACEMAKER IMPLANT N/A 05/07/2018    [employer] MRI conditional  dual-chamber pacemaker by Dr Rayann Heman for mobitz II second degree AV block  . right shoulder surgery      done at [facility]   . SHOULDER ARTHROSCOPY Left LEFT  2012  . TONSILLECTOMY  as child  . TRABECULECTOMY  2011   left eye (glaucoma)  . TRANSURETHRAL RESECTION OF PROSTATE N/A 07/24/2018   Procedure: TRANSURETHRAL RESECTION OF THE PROSTATE (TURP);  Surgeon: Festus Aloe, MD;  Location: [facility];  Service: Urology;  Laterality: N/A;       Family History  Problem Relation Age of Onset  . Breast cancer Mother   . Emphysema Father   . Alzheimer's  disease Brother     Social History   Tobacco Use  . Smoking status: Former Smoker    Years: 6.00    Types: Cigarettes    Quit date: 12/01/1953    Years since quitting: 66.4  . Smokeless tobacco: Never Used  Vaping Use  . Vaping Use: Never used  Substance Use Topics  . Alcohol use: Yes    Alcohol/week: 7.0 standard drinks    Types: 7 Glasses of wine per week    Comment: glass of wine several days per week  . Drug use: No    Home Medications Prior to Admission medications   Medication Sig Start Date End Date Taking? Authorizing Provider  aspirin EC 81 MG tablet Take 1 tablet (81 mg total) by mouth daily. 07/26/18   Festus Aloe, MD  azaTHIOprine (IMURAN) 50 MG tablet Take 50 mg by mouth every morning. Takes 1  1/2  TABS    [provider]  Cholecalciferol (VITAMIN D3) 1000 UNITS CAPS Take 2 capsules by mouth daily.     [provider]  Coenzyme Q10 (COQ10) 200 MG CAPS Take 1 capsule by mouth daily.    [provider]  dorzolamide-timolol (COSOPT) 22.3-6.8 MG/ML ophthalmic solution Place 1 drop into the right eye 3 (three) times daily.    [provider]  finasteride (PROSCAR) 5 MG tablet Take 5 mg by mouth daily. 01/26/20   [provider]  fish oil-omega-3 fatty acids 1000 MG capsule Take 1 g by mouth daily.     [provider]  fluoruracil (CARAC) 0.5 % cream Apply 1 application topically 2 (two) times daily.     [provider]  Ginkgo Biloba 40 MG TABS Take 120 mg by mouth daily.     [provider]  levothyroxine (SYNTHROID, LEVOTHROID) 50 MCG tablet Take 50-100 mcg by mouth daily before breakfast. Take 45mg everyday except Wednesday and Sunday take 1073m.    [provider]  mesalamine (APRISO) 0.375 G 24 hr capsule Take 2,250 mg by mouth daily. TAKES 6 TABS DAILY--  TOTAL 2250MG    [provider]  mesalamine (CANASA) 1000 MG suppository Place 1,000 mg rectally at bedtime.     [provider]  metroNIDAZOLE (METROGEL) 0.75 % gel Apply 1 application topically 2 (two) times daily as needed (Rosacea).     [provider]  Multiple Vitamins-Minerals (EQ COMPLETE MULTIVIT ADULT 50+ PO) Take 1 tablet by mouth daily.    [provider]  Netarsudil-Latanoprost (ROCKLATAN) 0.02-0.005 % SOLN Apply 1 drop to eye at bedtime.    [provider]  niacin (SLO-NIACIN) 500 MG tablet Take 500 mg by mouth at bedtime.    [provider]  simvastatin (ZOCOR) 20 MG tablet Take 10 mg by mouth every other day. TAKES QHS    [provider]  urea (CARMOL) 20 % cream Apply topically as needed.    [provider]    Allergies    Itraconazole, Other, and Lipitor [atorvastatin]  Review of Systems   Review of Systems  Constitutional: Negative for chills and fever.  Respiratory: Negative for cough and shortness of breath.   Cardiovascular: Negative for chest pain.  Gastrointestinal: Negative for abdominal pain, diarrhea, nausea and vomiting.  Genitourinary: Negative for dysuria, flank pain and hematuria.  Musculoskeletal: Positive for myalgias. Negative for back pain, joint swelling, neck pain and neck stiffness.  Neurological: Negative for dizziness, syncope, weakness, numbness and headaches.    Physical Exam Updated Vital Signs BP (!) 123/51 (BP Location: Right Arm)   Pulse 75   Temp 98.4 F (36.9 C) (Oral)   Resp 18   Ht 6' 2"  (1.88 m)   Wt 80.7 kg   SpO2 98%   BMI 22.85 kg/m   Physical Exam Vitals and nursing note reviewed.  Constitutional:      General: He is not in acute distress.    Appearance: He is well-developed. He is not diaphoretic.  HENT:     Head: Normocephalic and atraumatic.     Mouth/Throat:     Mouth: Mucous membranes are moist.     Pharynx: Oropharynx is clear.  Eyes:     Conjunctiva/sclera: Conjunctivae normal.  Cardiovascular:     Rate and Rhythm: Normal rate and regular rhythm.      Pulses: Normal pulses.          Radial pulses are 2+ on the right side and 2+ on the left side.       Posterior tibial pulses are 2+ on the right side and 2+ on the left side.     Heart sounds: Normal heart sounds.     Comments: Tactile temperature in the extremities appropriate and equal bilaterally. Pulmonary:     Effort: Pulmonary effort is normal. No respiratory distress.     Breath sounds: Normal breath sounds.  Abdominal:     Palpations: Abdomen is soft.     Tenderness: There is no abdominal tenderness. There is no guarding.  Musculoskeletal:     Cervical back: Neck supple.     Right lower leg: No edema.     Left lower leg: No edema.     Comments: Excellent range of motion in the major joints of the upper and lower extremities. No noted joint/extremity swelling or tenderness.  Lymphadenopathy:     Cervical: No cervical adenopathy.  Skin:    General: Skin is warm and dry.  Neurological:     Mental Status: He is alert.     Comments: No noted acute cognitive deficit. Sensation grossly intact to light touch in the extremities.   Grip strengths equal bilaterally.   Strength 5/5 in all extremities.  No gait disturbance.  Coordination intact.  Cranial nerves III-XII grossly intact.  Handles oral secretions without noted difficulty.  No noted phonation or speech deficit. No facial droop.   Psychiatric:        Mood and Affect: Mood and affect normal.        Speech: Speech normal.        Behavior: Behavior normal.     ED Results / Procedures / Treatments   Labs (all labs ordered are listed, but only abnormal results are displayed) Labs Reviewed  SARS CORONAVIRUS 2 (TAT 6-24 HRS)  RAPID INFLUENZA A&B ANTIGENS    EKG None  Radiology DG Chest Portable 1 View  Result Date: 05/11/2020 CLINICAL DATA:  Fever and cough.  COVID exposure EXAM: PORTABLE CHEST 1 VIEW COMPARISON:  05/07/2018 FINDINGS: Dual lead pacemaker unchanged in position. Heart size and vascularity normal.  Negative for heart failure. Lungs are clear without infiltrate or effusion. Surgical fixation of right clavicle fracture. IMPRESSION: No active disease. Electronically Signed   By: Franchot Gallo M.D.   On: 05/11/2020 18:30    Procedures Procedures (including critical care time)  Medications Ordered in ED Medications  acetaminophen (TYLENOL) tablet 650 mg (650 mg Oral Given 05/11/20 1759)    ED Course  I have reviewed the triage vital signs and the nursing notes.  Pertinent labs & imaging results that were available during my care of the patient were reviewed by me and considered in my medical decision making (see chart for details).    MDM Rules/Calculators/A&P                          Patient presents with complaint of generalized body aches. Patient is nontoxic appearing, afebrile, not tachycardic, not tachypneic, not hypotensive, maintains excellent SPO2 on room air, and is in no apparent distress.     Joseph Hanna was evaluated in Emergency Department on 05/11/2020 for the symptoms described in the history of present illness. He was evaluated in the context of the global COVID-19 pandemic, which necessitated consideration that the patient might be at risk for infection with the SARS-CoV-2 virus that causes COVID-19. Institutional protocols and algorithms that pertain to the evaluation of patients at risk for COVID-19 are in a state of rapid change based on information released by regulatory bodies including the CDC and federal and state organizations. These policies and algorithms were followed during the patient's care in the ED.  Findings and plan of care discussed with attending physician, Antony Blackbird, MD.  Final Clinical Impression(s) / ED Diagnoses Final diagnoses:  Body aches    Rx / DC Orders ED Discharge Orders    None       Layla Maw 05/11/20 2258    Tegeler, Gwenyth Allegra, MD 05/11/20 [phone]

## 2020-05-11 NOTE — Discharge Instructions (Addendum)
Recommend close follow-up with your primary care provider and hematology specialist.  Test Results for COVID-19 pending  You have a test pending for COVID-19.  Results typically return within about 48 hours.  Be sure to check MyChart for updated results.  We recommend isolating yourself until results are received.  Patients who have symptoms consistent with COVID-19 should self isolated for: At least 3 days (72 hours) have passed since recovery, defined as resolution of fever without the use of fever reducing medications and improvement in respiratory symptoms (e.g., cough, shortness of breath), and At least 7 days have passed since symptoms first appeared.  If you have no symptoms, but your test returns positive, recommend isolating for at least 10 days.

## 2020-05-11 NOTE — ED Triage Notes (Signed)
C/o known covid exposure , with symptoms x 2 days

## 2020-05-12 LAB — SARS CORONAVIRUS 2 (TAT 6-24 HRS): SARS Coronavirus 2: NEGATIVE

## 2020-05-17 DIAGNOSIS — E785 Hyperlipidemia, unspecified: Secondary | ICD-10-CM | POA: Diagnosis not present

## 2020-05-17 DIAGNOSIS — H409 Unspecified glaucoma: Secondary | ICD-10-CM | POA: Diagnosis not present

## 2020-05-17 DIAGNOSIS — D8481 Immunodeficiency due to conditions classified elsewhere: Secondary | ICD-10-CM | POA: Diagnosis not present

## 2020-05-17 DIAGNOSIS — B351 Tinea unguium: Secondary | ICD-10-CM | POA: Diagnosis not present

## 2020-05-17 DIAGNOSIS — Z008 Encounter for other general examination: Secondary | ICD-10-CM | POA: Diagnosis not present

## 2020-05-17 DIAGNOSIS — H547 Unspecified visual loss: Secondary | ICD-10-CM | POA: Diagnosis not present

## 2020-05-17 DIAGNOSIS — E039 Hypothyroidism, unspecified: Secondary | ICD-10-CM | POA: Diagnosis not present

## 2020-05-17 DIAGNOSIS — K5190 Ulcerative colitis, unspecified, without complications: Secondary | ICD-10-CM | POA: Diagnosis not present

## 2020-05-17 DIAGNOSIS — L719 Rosacea, unspecified: Secondary | ICD-10-CM | POA: Diagnosis not present

## 2020-05-17 DIAGNOSIS — G8929 Other chronic pain: Secondary | ICD-10-CM | POA: Diagnosis not present

## 2020-05-17 DIAGNOSIS — D84821 Immunodeficiency due to drugs: Secondary | ICD-10-CM | POA: Diagnosis not present

## 2020-05-22 NOTE — Progress Notes (Signed)
Remote pacemaker transmission.   

## 2020-06-20 DIAGNOSIS — H35033 Hypertensive retinopathy, bilateral: Secondary | ICD-10-CM | POA: Diagnosis not present

## 2020-06-20 DIAGNOSIS — H35372 Puckering of macula, left eye: Secondary | ICD-10-CM | POA: Diagnosis not present

## 2020-06-20 DIAGNOSIS — H16223 Keratoconjunctivitis sicca, not specified as Sjogren's, bilateral: Secondary | ICD-10-CM | POA: Diagnosis not present

## 2020-06-20 DIAGNOSIS — H401133 Primary open-angle glaucoma, bilateral, severe stage: Secondary | ICD-10-CM | POA: Diagnosis not present

## 2020-06-20 DIAGNOSIS — H02401 Unspecified ptosis of right eyelid: Secondary | ICD-10-CM | POA: Diagnosis not present

## 2020-06-22 ENCOUNTER — Encounter: Payer: Self-pay | Admitting: Hematology & Oncology

## 2020-06-22 ENCOUNTER — Other Ambulatory Visit: Payer: Self-pay

## 2020-06-22 ENCOUNTER — Inpatient Hospital Stay (HOSPITAL_BASED_OUTPATIENT_CLINIC_OR_DEPARTMENT_OTHER): Payer: Medicare HMO | Admitting: Hematology & Oncology

## 2020-06-22 ENCOUNTER — Inpatient Hospital Stay: Payer: Medicare HMO | Attending: Hematology & Oncology

## 2020-06-22 VITALS — BP 147/66 | HR 68 | Temp 97.8°F | Resp 20 | Wt 168.0 lb

## 2020-06-22 DIAGNOSIS — K769 Liver disease, unspecified: Secondary | ICD-10-CM | POA: Diagnosis not present

## 2020-06-22 DIAGNOSIS — D696 Thrombocytopenia, unspecified: Secondary | ICD-10-CM

## 2020-06-22 DIAGNOSIS — Z950 Presence of cardiac pacemaker: Secondary | ICD-10-CM | POA: Insufficient documentation

## 2020-06-22 DIAGNOSIS — Z79899 Other long term (current) drug therapy: Secondary | ICD-10-CM | POA: Diagnosis not present

## 2020-06-22 DIAGNOSIS — Z7982 Long term (current) use of aspirin: Secondary | ICD-10-CM | POA: Insufficient documentation

## 2020-06-22 DIAGNOSIS — D6959 Other secondary thrombocytopenia: Secondary | ICD-10-CM | POA: Diagnosis not present

## 2020-06-22 DIAGNOSIS — Z8719 Personal history of other diseases of the digestive system: Secondary | ICD-10-CM

## 2020-06-22 DIAGNOSIS — K5190 Ulcerative colitis, unspecified, without complications: Secondary | ICD-10-CM | POA: Insufficient documentation

## 2020-06-22 LAB — CMP (CANCER CENTER ONLY)
ALT: 11 U/L (ref 0–44)
AST: 13 U/L — ABNORMAL LOW (ref 15–41)
Albumin: 4.6 g/dL (ref 3.5–5.0)
Alkaline Phosphatase: 36 U/L — ABNORMAL LOW (ref 38–126)
Anion gap: 6 (ref 5–15)
BUN: 23 mg/dL (ref 8–23)
CO2: 30 mmol/L (ref 22–32)
Calcium: 10 mg/dL (ref 8.9–10.3)
Chloride: 106 mmol/L (ref 98–111)
Creatinine: 1.27 mg/dL — ABNORMAL HIGH (ref 0.61–1.24)
GFR, Estimated: 55 mL/min — ABNORMAL LOW (ref >60)
Glucose, Bld: 119 mg/dL — ABNORMAL HIGH (ref 70–99)
Potassium: 4.6 mmol/L (ref 3.5–5.1)
Sodium: 142 mmol/L (ref 135–145)
Total Bilirubin: 0.6 mg/dL (ref 0.3–1.2)
Total Protein: 6.8 g/dL (ref 6.5–8.1)

## 2020-06-22 LAB — CBC WITH DIFFERENTIAL (CANCER CENTER ONLY)
Abs Immature Granulocytes: 0.03 10*3/uL (ref 0.00–0.07)
Basophils Absolute: 0 10*3/uL (ref 0.0–0.1)
Basophils Relative: 0 %
Eosinophils Absolute: 0 10*3/uL (ref 0.0–0.5)
Eosinophils Relative: 0 %
HCT: 42 % (ref 39.0–52.0)
Hemoglobin: 13.2 g/dL (ref 13.0–17.0)
Immature Granulocytes: 1 %
Lymphocytes Relative: 17 %
Lymphs Abs: 0.9 10*3/uL (ref 0.7–4.0)
MCH: 30.1 pg (ref 26.0–34.0)
MCHC: 31.4 g/dL (ref 30.0–36.0)
MCV: 95.9 fL (ref 80.0–100.0)
Monocytes Absolute: 1.5 10*3/uL — ABNORMAL HIGH (ref 0.1–1.0)
Monocytes Relative: 30 %
Neutro Abs: 2.7 10*3/uL (ref 1.7–7.7)
Neutrophils Relative %: 52 %
Platelet Count: 62 10*3/uL — ABNORMAL LOW (ref 150–400)
RBC: 4.38 MIL/uL (ref 4.22–5.81)
RDW: 14.9 % (ref 11.5–15.5)
WBC Count: 5.1 10*3/uL (ref 4.0–10.5)
nRBC: 0 % (ref 0.0–0.2)

## 2020-06-22 LAB — SAVE SMEAR(SSMR), FOR PROVIDER SLIDE REVIEW

## 2020-06-22 LAB — PLATELET BY CITRATE

## 2020-06-22 LAB — LACTATE DEHYDROGENASE: LDH: 142 U/L (ref 98–192)

## 2020-06-22 NOTE — Progress Notes (Signed)
Hematology and Oncology Follow Up Visit  Joseph Hanna [id number] 1933/03/31 87 y.o. 06/22/2020   Principle Diagnosis:  Thrombocytopenia secondary to underlying liver disease and Imuran (for UC)  Current Therapy:   Observation   Interim History:  Joseph Hanna is here today for follow-up.  This is first time that I am seeing him.  He was followed by Dr. Maylon Peppers.  He has thrombocytopenia.  This is chronic.  He has multiple reasons for this.  He is on Imuran for ulcerative colitis.  He has underlying liver disease.  He says that the ulcerative colitis has not flared up for several years.  He has been followed closely by Dr. Watt Climes of Gastroenterology.  He is on aspirin.  Is on baby aspirin.  He does have some bruising.  His appetite is doing okay.  He is not a vegetarian.  He does have a pacemaker in place.  He has had no cardiac issues.  He has had no problems with hematuria.  There is no issues with respect to COVID.  He has been very diligent with watching out for Covid.  Overall, I would say his performance status is ECOG 1.  Medications:  Allergies as of 06/22/2020      Reactions   Itraconazole Rash   Other Rash   Lipitor [atorvastatin] Other (See Comments)   "severe muscle aches"      Medication List       Accurate as of June 22, 2020 12:08 PM. If you have any questions, ask your nurse or doctor.        aspirin EC 81 MG tablet Take 1 tablet (81 mg total) by mouth daily.   azaTHIOprine 50 MG tablet Commonly known as: IMURAN Take 50 mg by mouth every morning.   CoQ10 200 MG Caps Take 1 capsule by mouth daily.   dorzolamide-timolol 22.3-6.8 MG/ML ophthalmic solution Commonly known as: COSOPT Place 1 drop into the right eye 3 (three) times daily.   EQ COMPLETE MULTIVIT ADULT 50+ PO Take 1 tablet by mouth daily.   finasteride 5 MG tablet Commonly known as: PROSCAR Take 5 mg by mouth daily.   fish oil-omega-3 fatty acids 1000 MG capsule Take 1 g by mouth  daily.   fluoruracil 0.5 % cream Commonly known as: CARAC Apply 1 application topically 2 (two) times daily.   Ginkgo Biloba 40 MG Tabs Take 120 mg by mouth daily.   levothyroxine 50 MCG tablet Commonly known as: SYNTHROID Take 50-100 mcg by mouth daily before breakfast. Take 62mg T, Th, S,and S.  Takes 100 mcg M,W,and F.   mesalamine 0.375 g 24 hr capsule Commonly known as: APRISO Take 2,250 mg by mouth daily. TAKES 6 TABS DAILY--  TOTAL 2250MG   mesalamine 1000 MG suppository Commonly known as: CANASA Place 1,000 mg rectally at bedtime.   metroNIDAZOLE 0.75 % gel Commonly known as: METROGEL Apply 1 application topically 2 (two) times daily as needed (Rosacea).   multivitamin tablet Take 1 tablet by mouth daily.   Netarsudil-Latanoprost 0.02-0.005 % Soln Apply 1 drop to eye at bedtime. Right eye.   niacin 500 MG tablet Commonly known as: SLO-NIACIN Take 500 mg by mouth at bedtime.   simvastatin 20 MG tablet Commonly known as: ZOCOR Take 10 mg by mouth every other day. TAKES QHS   urea 20 % cream Commonly known as: CARMOL Apply topically as needed.   Vitamin D3 25 MCG (1000 UT) Caps Take 2 capsules by mouth daily.  Allergies:  Allergies  Allergen Reactions  . Itraconazole Rash  . Other Rash  . Lipitor [Atorvastatin] Other (See Comments)    "severe muscle aches"    Past Medical History, Surgical history, Social history, and Family History were reviewed and updated.  Review of Systems: Review of Systems  Constitutional: Negative.   HENT: Negative.   Eyes: Negative.   Respiratory: Negative.   Cardiovascular: Negative.   Gastrointestinal: Negative.   Genitourinary: Negative.   Musculoskeletal: Negative.   Skin: Negative.   Neurological: Negative.   Endo/Heme/Allergies: Bruises/bleeds easily.  Psychiatric/Behavioral: Negative.     Physical Exam:  weight is 168 lb (76.2 kg). His oral temperature is 97.8 F (36.6 C). His blood pressure is  147/66 (abnormal) and his pulse is 68. His respiration is 20 and oxygen saturation is 99%.   Wt Readings from Last 3 Encounters:  06/22/20 168 lb (76.2 kg)  05/11/20 178 lb (80.7 kg)  02/22/20 168 lb (76.2 kg)    Physical Exam Vitals reviewed.  HENT:     Head: Normocephalic and atraumatic.     Mouth/Throat:     Mouth: Oropharynx is clear and moist.  Eyes:     Extraocular Movements: EOM normal.     Pupils: Pupils are equal, round, and reactive to light.  Cardiovascular:     Rate and Rhythm: Normal rate and regular rhythm.     Heart sounds: Normal heart sounds.  Pulmonary:     Effort: Pulmonary effort is normal.     Breath sounds: Normal breath sounds.  Abdominal:     General: Bowel sounds are normal.     Palpations: Abdomen is soft.  Musculoskeletal:        General: No tenderness, deformity or edema. Normal range of motion.     Cervical back: Normal range of motion.  Lymphadenopathy:     Cervical: No cervical adenopathy.  Skin:    General: Skin is warm and dry.     Findings: No erythema or rash.  Neurological:     Mental Status: He is alert and oriented to person, place, and time.  Psychiatric:        Mood and Affect: Mood and affect normal.        Behavior: Behavior normal.        Thought Content: Thought content normal.        Judgment: Judgment normal.      Lab Results  Component Value Date   WBC 5.1 06/22/2020   HGB 13.2 06/22/2020   HCT 42.0 06/22/2020   MCV 95.9 06/22/2020   PLT 62 (L) 06/22/2020   Lab Results  Component Value Date   FERRITIN 70 05/25/2019   IRON 96 05/25/2019   TIBC 308 05/25/2019   UIBC 212 05/25/2019   IRONPCTSAT 31 05/25/2019   Lab Results  Component Value Date   RBC 4.38 06/22/2020   No results found for: KPAFRELGTCHN, LAMBDASER, KAPLAMBRATIO No results found for: IGGSERUM, IGA, IGMSERUM No results found for: Kathrynn Ducking, MSPIKE, SPEI   Chemistry      Component Value  Date/Time   NA 142 06/22/2020 1102   K 4.6 06/22/2020 1102   CL 106 06/22/2020 1102   CO2 30 06/22/2020 1102   BUN 23 06/22/2020 1102   CREATININE 1.27 (H) 06/22/2020 1102      Component Value Date/Time   CALCIUM 10.0 06/22/2020 1102   ALKPHOS 36 (L) 06/22/2020 1102   AST 13 (L) 06/22/2020 1102   ALT  11 06/22/2020 1102   BILITOT 0.6 06/22/2020 1102       Impression and Plan: Joseph Hanna is a very pleasant 87 yo caucasian gentleman with history of thrombocytopenia since at least February 2017.   Of note, he served in the [place] [employer].  As such, he is a true American hero.  He does get some of his care at the [facility] hospital in [place].  His platelet count is holding steady.  I looked at his blood smear under the microscope.  He has some large platelets.  They are well granulated.  He has no immature myeloid cells.  He has no nucleated red blood cells.  For now, we will just follow him along.  I do not see that we have to make any interventions.  He does not need a bone marrow biopsy.  We will plan to get him back in the summertime now.  It was a lot of fun talking to him.  Volanda Napoleon, MD 2/10/202212:08 PM

## 2020-07-11 ENCOUNTER — Encounter (INDEPENDENT_AMBULATORY_CARE_PROVIDER_SITE_OTHER): Payer: Medicare HMO | Admitting: Ophthalmology

## 2020-07-11 ENCOUNTER — Other Ambulatory Visit: Payer: Self-pay

## 2020-07-11 DIAGNOSIS — H33302 Unspecified retinal break, left eye: Secondary | ICD-10-CM | POA: Diagnosis not present

## 2020-07-11 DIAGNOSIS — H35372 Puckering of macula, left eye: Secondary | ICD-10-CM | POA: Diagnosis not present

## 2020-07-11 DIAGNOSIS — H4421 Degenerative myopia, right eye: Secondary | ICD-10-CM | POA: Diagnosis not present

## 2020-07-11 DIAGNOSIS — H43813 Vitreous degeneration, bilateral: Secondary | ICD-10-CM

## 2020-07-18 DIAGNOSIS — E785 Hyperlipidemia, unspecified: Secondary | ICD-10-CM | POA: Diagnosis not present

## 2020-07-18 DIAGNOSIS — E039 Hypothyroidism, unspecified: Secondary | ICD-10-CM | POA: Diagnosis not present

## 2020-07-18 DIAGNOSIS — R7301 Impaired fasting glucose: Secondary | ICD-10-CM | POA: Diagnosis not present

## 2020-07-18 DIAGNOSIS — Z125 Encounter for screening for malignant neoplasm of prostate: Secondary | ICD-10-CM | POA: Diagnosis not present

## 2020-07-24 DIAGNOSIS — D485 Neoplasm of uncertain behavior of skin: Secondary | ICD-10-CM | POA: Diagnosis not present

## 2020-07-24 DIAGNOSIS — B351 Tinea unguium: Secondary | ICD-10-CM | POA: Diagnosis not present

## 2020-07-24 DIAGNOSIS — L570 Actinic keratosis: Secondary | ICD-10-CM | POA: Diagnosis not present

## 2020-07-24 DIAGNOSIS — L821 Other seborrheic keratosis: Secondary | ICD-10-CM | POA: Diagnosis not present

## 2020-07-24 DIAGNOSIS — C44629 Squamous cell carcinoma of skin of left upper limb, including shoulder: Secondary | ICD-10-CM | POA: Diagnosis not present

## 2020-07-24 DIAGNOSIS — L723 Sebaceous cyst: Secondary | ICD-10-CM | POA: Diagnosis not present

## 2020-07-24 DIAGNOSIS — D1801 Hemangioma of skin and subcutaneous tissue: Secondary | ICD-10-CM | POA: Diagnosis not present

## 2020-07-24 DIAGNOSIS — Z85828 Personal history of other malignant neoplasm of skin: Secondary | ICD-10-CM | POA: Diagnosis not present

## 2020-07-24 DIAGNOSIS — L812 Freckles: Secondary | ICD-10-CM | POA: Diagnosis not present

## 2020-07-25 DIAGNOSIS — K5190 Ulcerative colitis, unspecified, without complications: Secondary | ICD-10-CM | POA: Diagnosis not present

## 2020-07-25 DIAGNOSIS — Z1331 Encounter for screening for depression: Secondary | ICD-10-CM | POA: Diagnosis not present

## 2020-07-25 DIAGNOSIS — E785 Hyperlipidemia, unspecified: Secondary | ICD-10-CM | POA: Diagnosis not present

## 2020-07-25 DIAGNOSIS — Z0000 Encounter for general adult medical examination without abnormal findings: Secondary | ICD-10-CM | POA: Diagnosis not present

## 2020-07-25 DIAGNOSIS — I739 Peripheral vascular disease, unspecified: Secondary | ICD-10-CM | POA: Diagnosis not present

## 2020-07-25 DIAGNOSIS — D692 Other nonthrombocytopenic purpura: Secondary | ICD-10-CM | POA: Diagnosis not present

## 2020-07-25 DIAGNOSIS — E039 Hypothyroidism, unspecified: Secondary | ICD-10-CM | POA: Diagnosis not present

## 2020-07-25 DIAGNOSIS — L719 Rosacea, unspecified: Secondary | ICD-10-CM | POA: Diagnosis not present

## 2020-07-25 DIAGNOSIS — D696 Thrombocytopenia, unspecified: Secondary | ICD-10-CM | POA: Diagnosis not present

## 2020-07-25 DIAGNOSIS — M1990 Unspecified osteoarthritis, unspecified site: Secondary | ICD-10-CM | POA: Diagnosis not present

## 2020-07-25 DIAGNOSIS — N183 Chronic kidney disease, stage 3 unspecified: Secondary | ICD-10-CM | POA: Diagnosis not present

## 2020-07-25 DIAGNOSIS — R82998 Other abnormal findings in urine: Secondary | ICD-10-CM | POA: Diagnosis not present

## 2020-08-07 ENCOUNTER — Ambulatory Visit (INDEPENDENT_AMBULATORY_CARE_PROVIDER_SITE_OTHER): Payer: Medicare HMO

## 2020-08-07 DIAGNOSIS — I442 Atrioventricular block, complete: Secondary | ICD-10-CM

## 2020-08-07 LAB — CUP PACEART REMOTE DEVICE CHECK
Battery Remaining Longevity: 101 mo
Battery Remaining Percentage: 95.5 %
Battery Voltage: 2.99 V
Brady Statistic AP VP Percent: 76 %
Brady Statistic AP VS Percent: 1 %
Brady Statistic AS VP Percent: 22 %
Brady Statistic AS VS Percent: 1 %
Brady Statistic RA Percent Paced: 75 %
Brady Statistic RV Percent Paced: 99 %
Date Time Interrogation Session: 20220328024820
Implantable Lead Implant Date: 20191226
Implantable Lead Implant Date: 20191226
Implantable Lead Location: 753859
Implantable Lead Location: 753860
Implantable Pulse Generator Implant Date: 20191226
Lead Channel Impedance Value: 440 Ohm
Lead Channel Impedance Value: 480 Ohm
Lead Channel Pacing Threshold Amplitude: 0.5 V
Lead Channel Pacing Threshold Amplitude: 1 V
Lead Channel Pacing Threshold Pulse Width: 0.5 ms
Lead Channel Pacing Threshold Pulse Width: 0.5 ms
Lead Channel Sensing Intrinsic Amplitude: 10.2 mV
Lead Channel Sensing Intrinsic Amplitude: 3.1 mV
Lead Channel Setting Pacing Amplitude: 2 V
Lead Channel Setting Pacing Amplitude: 2.5 V
Lead Channel Setting Pacing Pulse Width: 0.5 ms
Lead Channel Setting Sensing Sensitivity: 2 mV
Pulse Gen Model: 2272
Pulse Gen Serial Number: 9094310

## 2020-08-10 DIAGNOSIS — N401 Enlarged prostate with lower urinary tract symptoms: Secondary | ICD-10-CM | POA: Diagnosis not present

## 2020-08-10 DIAGNOSIS — E785 Hyperlipidemia, unspecified: Secondary | ICD-10-CM | POA: Diagnosis not present

## 2020-08-10 DIAGNOSIS — N183 Chronic kidney disease, stage 3 unspecified: Secondary | ICD-10-CM | POA: Diagnosis not present

## 2020-08-10 DIAGNOSIS — K5190 Ulcerative colitis, unspecified, without complications: Secondary | ICD-10-CM | POA: Diagnosis not present

## 2020-08-10 DIAGNOSIS — H409 Unspecified glaucoma: Secondary | ICD-10-CM | POA: Diagnosis not present

## 2020-08-16 DIAGNOSIS — N401 Enlarged prostate with lower urinary tract symptoms: Secondary | ICD-10-CM | POA: Diagnosis not present

## 2020-08-16 DIAGNOSIS — N3946 Mixed incontinence: Secondary | ICD-10-CM | POA: Diagnosis not present

## 2020-08-16 DIAGNOSIS — R972 Elevated prostate specific antigen [PSA]: Secondary | ICD-10-CM | POA: Diagnosis not present

## 2020-08-18 NOTE — Progress Notes (Signed)
Remote pacemaker transmission.   

## 2020-09-05 DIAGNOSIS — L245 Irritant contact dermatitis due to other chemical products: Secondary | ICD-10-CM | POA: Diagnosis not present

## 2020-09-05 DIAGNOSIS — Z85828 Personal history of other malignant neoplasm of skin: Secondary | ICD-10-CM | POA: Diagnosis not present

## 2020-09-09 DIAGNOSIS — N183 Chronic kidney disease, stage 3 unspecified: Secondary | ICD-10-CM | POA: Diagnosis not present

## 2020-09-09 DIAGNOSIS — E039 Hypothyroidism, unspecified: Secondary | ICD-10-CM | POA: Diagnosis not present

## 2020-09-09 DIAGNOSIS — E785 Hyperlipidemia, unspecified: Secondary | ICD-10-CM | POA: Diagnosis not present

## 2020-10-17 DIAGNOSIS — Z79899 Other long term (current) drug therapy: Secondary | ICD-10-CM | POA: Diagnosis not present

## 2020-10-17 DIAGNOSIS — Z13820 Encounter for screening for osteoporosis: Secondary | ICD-10-CM | POA: Diagnosis not present

## 2020-11-06 ENCOUNTER — Ambulatory Visit (INDEPENDENT_AMBULATORY_CARE_PROVIDER_SITE_OTHER): Payer: Medicare HMO

## 2020-11-06 DIAGNOSIS — I442 Atrioventricular block, complete: Secondary | ICD-10-CM | POA: Diagnosis not present

## 2020-11-07 LAB — CUP PACEART REMOTE DEVICE CHECK
Battery Remaining Longevity: 68 mo
Battery Remaining Percentage: 69 %
Battery Voltage: 2.99 V
Brady Statistic AP VP Percent: 73 %
Brady Statistic AP VS Percent: 1 %
Brady Statistic AS VP Percent: 26 %
Brady Statistic AS VS Percent: 1 %
Brady Statistic RA Percent Paced: 72 %
Brady Statistic RV Percent Paced: 99 %
Date Time Interrogation Session: 20220627020015
Implantable Lead Implant Date: 20191226
Implantable Lead Implant Date: 20191226
Implantable Lead Location: 753859
Implantable Lead Location: 753860
Implantable Pulse Generator Implant Date: 20191226
Lead Channel Impedance Value: 430 Ohm
Lead Channel Impedance Value: 480 Ohm
Lead Channel Pacing Threshold Amplitude: 0.5 V
Lead Channel Pacing Threshold Amplitude: 1 V
Lead Channel Pacing Threshold Pulse Width: 0.5 ms
Lead Channel Pacing Threshold Pulse Width: 0.5 ms
Lead Channel Sensing Intrinsic Amplitude: 10.8 mV
Lead Channel Sensing Intrinsic Amplitude: 2.7 mV
Lead Channel Setting Pacing Amplitude: 2 V
Lead Channel Setting Pacing Amplitude: 2.5 V
Lead Channel Setting Pacing Pulse Width: 0.5 ms
Lead Channel Setting Sensing Sensitivity: 2 mV
Pulse Gen Model: 2272
Pulse Gen Serial Number: 9094310

## 2020-11-20 ENCOUNTER — Encounter: Payer: Self-pay | Admitting: Hematology & Oncology

## 2020-11-20 ENCOUNTER — Inpatient Hospital Stay: Payer: No Typology Code available for payment source | Attending: Hematology & Oncology

## 2020-11-20 ENCOUNTER — Inpatient Hospital Stay: Payer: No Typology Code available for payment source | Admitting: Hematology & Oncology

## 2020-11-20 ENCOUNTER — Other Ambulatory Visit: Payer: Self-pay

## 2020-11-20 VITALS — BP 124/60 | HR 61 | Temp 98.1°F | Resp 20 | Wt 164.0 lb

## 2020-11-20 DIAGNOSIS — Z7982 Long term (current) use of aspirin: Secondary | ICD-10-CM | POA: Diagnosis not present

## 2020-11-20 DIAGNOSIS — D696 Thrombocytopenia, unspecified: Secondary | ICD-10-CM | POA: Diagnosis not present

## 2020-11-20 DIAGNOSIS — K769 Liver disease, unspecified: Secondary | ICD-10-CM | POA: Diagnosis not present

## 2020-11-20 DIAGNOSIS — Z79899 Other long term (current) drug therapy: Secondary | ICD-10-CM | POA: Diagnosis not present

## 2020-11-20 DIAGNOSIS — Z8719 Personal history of other diseases of the digestive system: Secondary | ICD-10-CM

## 2020-11-20 DIAGNOSIS — D6959 Other secondary thrombocytopenia: Secondary | ICD-10-CM | POA: Diagnosis not present

## 2020-11-20 DIAGNOSIS — Z950 Presence of cardiac pacemaker: Secondary | ICD-10-CM | POA: Insufficient documentation

## 2020-11-20 LAB — CBC WITH DIFFERENTIAL (CANCER CENTER ONLY)
Abs Immature Granulocytes: 0.05 10*3/uL (ref 0.00–0.07)
Basophils Absolute: 0 10*3/uL (ref 0.0–0.1)
Basophils Relative: 0 %
Eosinophils Absolute: 0 10*3/uL (ref 0.0–0.5)
Eosinophils Relative: 0 %
HCT: 44.4 % (ref 39.0–52.0)
Hemoglobin: 13.9 g/dL (ref 13.0–17.0)
Immature Granulocytes: 1 %
Lymphocytes Relative: 18 %
Lymphs Abs: 0.9 10*3/uL (ref 0.7–4.0)
MCH: 30.4 pg (ref 26.0–34.0)
MCHC: 31.3 g/dL (ref 30.0–36.0)
MCV: 97.2 fL (ref 80.0–100.0)
Monocytes Absolute: 1.4 10*3/uL — ABNORMAL HIGH (ref 0.1–1.0)
Monocytes Relative: 26 %
Neutro Abs: 2.9 10*3/uL (ref 1.7–7.7)
Neutrophils Relative %: 55 %
Platelet Count: 52 10*3/uL — ABNORMAL LOW (ref 150–400)
RBC: 4.57 MIL/uL (ref 4.22–5.81)
RDW: 14.6 % (ref 11.5–15.5)
WBC Count: 5.3 10*3/uL (ref 4.0–10.5)
nRBC: 0 % (ref 0.0–0.2)

## 2020-11-20 LAB — CMP (CANCER CENTER ONLY)
ALT: 12 U/L (ref 0–44)
AST: 15 U/L (ref 15–41)
Albumin: 4.8 g/dL (ref 3.5–5.0)
Alkaline Phosphatase: 38 U/L (ref 38–126)
Anion gap: 5 (ref 5–15)
BUN: 20 mg/dL (ref 8–23)
CO2: 29 mmol/L (ref 22–32)
Calcium: 9.9 mg/dL (ref 8.9–10.3)
Chloride: 105 mmol/L (ref 98–111)
Creatinine: 1.38 mg/dL — ABNORMAL HIGH (ref 0.61–1.24)
GFR, Estimated: 50 mL/min — ABNORMAL LOW (ref >60)
Glucose, Bld: 109 mg/dL — ABNORMAL HIGH (ref 70–99)
Potassium: 4.2 mmol/L (ref 3.5–5.1)
Sodium: 139 mmol/L (ref 135–145)
Total Bilirubin: 0.6 mg/dL (ref 0.3–1.2)
Total Protein: 7.2 g/dL (ref 6.5–8.1)

## 2020-11-20 LAB — SAVE SMEAR(SSMR), FOR PROVIDER SLIDE REVIEW

## 2020-11-20 LAB — PLATELET BY CITRATE

## 2020-11-20 NOTE — Progress Notes (Signed)
Hematology and Oncology Follow Up Visit  Joseph Hanna [id number] 24-Sep-1932 87 y.o. 11/20/2020   Principle Diagnosis:  Thrombocytopenia secondary to underlying liver disease and Imuran (for UC)  Current Therapy:   Observation   Interim History:  Joseph Hanna is here today for follow-up.  He is doing okay.  We last saw him back in February.  Since then, he has been doing okay.  He has a pacemaker in.  He is on baby aspirin.  He is having some bruising.  I think with his platelet count being a little bit lower, we can probably stop the baby aspirin.  He has had no issues with actual bleeding.  There is no problems with nausea or vomiting.  He has had no fever.  He has had no exposure to COVID.  He did have a very nice July 4 weekend.  He was in [place].  There is no problems with leg swelling.  Overall, I would have to say his performance status is ECOG 1.     Medications:  Allergies as of 11/20/2020       Reactions   Itraconazole Rash   Other Rash   Lipitor [atorvastatin] Other (See Comments)   "severe muscle aches"        Medication List        Accurate as of November 20, 2020 10:05 AM. If you have any questions, ask your nurse or doctor.          STOP taking these medications    fish oil-omega-3 fatty acids 1000 MG capsule Stopped by: Volanda Napoleon, MD       TAKE these medications    aspirin EC 81 MG tablet Take 1 tablet (81 mg total) by mouth daily.   azaTHIOprine 50 MG tablet Commonly known as: IMURAN Take 50 mg by mouth every morning.   CoQ10 200 MG Caps Take 1 capsule by mouth daily.   dorzolamide-timolol 22.3-6.8 MG/ML ophthalmic solution Commonly known as: COSOPT Place 1 drop into the right eye 3 (three) times daily.   EQ COMPLETE MULTIVIT ADULT 50+ PO Take 1 tablet by mouth daily.   finasteride 5 MG tablet Commonly known as: PROSCAR Take 5 mg by mouth daily.   fluoruracil 0.5 % cream Commonly known as: CARAC Apply 1 application  topically 2 (two) times daily.   Ginkgo Biloba 40 MG Tabs Take 120 mg by mouth daily.   levothyroxine 50 MCG tablet Commonly known as: SYNTHROID Take 50-100 mcg by mouth daily before breakfast. Take 50mg T, Th, S,and S.  Takes 100 mcg M,W,and F.   mesalamine 0.375 g 24 hr capsule Commonly known as: APRISO Take 2,250 mg by mouth daily. TAKES 6 TABS DAILY--  TOTAL 2250MG   mesalamine 1000 MG suppository Commonly known as: CANASA Place 1,000 mg rectally at bedtime.   metroNIDAZOLE 0.75 % gel Commonly known as: METROGEL Apply 1 application topically 2 (two) times daily as needed (Rosacea).   Netarsudil-Latanoprost 0.02-0.005 % Soln Apply 1 drop to eye at bedtime. Right eye.   niacin 500 MG tablet Commonly known as: SLO-NIACIN Take 500 mg by mouth at bedtime.   simvastatin 20 MG tablet Commonly known as: ZOCOR Take 10 mg by mouth daily. TAKES QHS   urea 20 % cream Commonly known as: CARMOL Apply topically as needed.   Vitamin D3 25 MCG (1000 UT) Caps Take 2 capsules by mouth daily.        Allergies:  Allergies  Allergen Reactions   Itraconazole  Rash   Other Rash   Lipitor [Atorvastatin] Other (See Comments)    "severe muscle aches"    Past Medical History, Surgical history, Social history, and Family History were reviewed and updated.  Review of Systems: Review of Systems  Constitutional: Negative.   HENT: Negative.    Eyes: Negative.   Respiratory: Negative.    Cardiovascular: Negative.   Gastrointestinal: Negative.   Genitourinary: Negative.   Musculoskeletal: Negative.   Skin: Negative.   Neurological: Negative.   Endo/Heme/Allergies:  Bruises/bleeds easily.  Psychiatric/Behavioral: Negative.     Physical Exam:  weight is 164 lb (74.4 kg). His oral temperature is 98.1 F (36.7 C). His blood pressure is 124/60 and his pulse is 61. His respiration is 20 and oxygen saturation is 99%.   Wt Readings from Last 3 Encounters:  11/20/20 164 lb (74.4 kg)   06/22/20 168 lb (76.2 kg)  05/11/20 178 lb (80.7 kg)    Physical Exam Vitals reviewed.  HENT:     Head: Normocephalic and atraumatic.  Eyes:     Pupils: Pupils are equal, round, and reactive to light.  Cardiovascular:     Rate and Rhythm: Normal rate and regular rhythm.     Heart sounds: Normal heart sounds.  Pulmonary:     Effort: Pulmonary effort is normal.     Breath sounds: Normal breath sounds.  Abdominal:     General: Bowel sounds are normal.     Palpations: Abdomen is soft.  Musculoskeletal:        General: No tenderness or deformity. Normal range of motion.     Cervical back: Normal range of motion.  Lymphadenopathy:     Cervical: No cervical adenopathy.  Skin:    General: Skin is warm and dry.     Findings: No erythema or rash.  Neurological:     Mental Status: He is alert and oriented to person, place, and time.  Psychiatric:        Behavior: Behavior normal.        Thought Content: Thought content normal.        Judgment: Judgment normal.     Lab Results  Component Value Date   WBC 5.3 11/20/2020   HGB 13.9 11/20/2020   HCT 44.4 11/20/2020   MCV 97.2 11/20/2020   PLT 52 (L) 11/20/2020   Lab Results  Component Value Date   FERRITIN 70 05/25/2019   IRON 96 05/25/2019   TIBC 308 05/25/2019   UIBC 212 05/25/2019   IRONPCTSAT 31 05/25/2019   Lab Results  Component Value Date   RBC 4.57 11/20/2020   No results found for: KPAFRELGTCHN, LAMBDASER, KAPLAMBRATIO No results found for: IGGSERUM, IGA, IGMSERUM No results found for: Odetta Pink, SPEI   Chemistry      Component Value Date/Time   NA 139 11/20/2020 0909   K 4.2 11/20/2020 0909   CL 105 11/20/2020 0909   CO2 29 11/20/2020 0909   BUN 20 11/20/2020 0909   CREATININE 1.38 (H) 11/20/2020 0909      Component Value Date/Time   CALCIUM 9.9 11/20/2020 0909   ALKPHOS 38 11/20/2020 0909   AST 15 11/20/2020 0909   ALT 12 11/20/2020 0909    BILITOT 0.6 11/20/2020 0909       Impression and Plan: Joseph Hanna is a very pleasant 87 yo caucasian gentleman with history of thrombocytopenia since at least February 2017.   Of note, he served in the [place] [employer].  As such, he is a true American hero.  He does get some of his care at the [facility] hospital in [place].  His platelet count is little bit lower.  Again, this would not surprise me.  He is still on the Imuran.  He does have some hepatic disease.  I suspect his platelet count will always be a little bit low.  Again he will stop his aspirin for right now.  I would like to plan to get him back in another 3 months.  I did look at his blood smear under the microscope   He has some large platelets.  They are well granulated.  He has no immature myeloid cells.  He has no nucleated red blood cells.  Volanda Napoleon, MD 7/11/202210:05 AM

## 2020-11-21 ENCOUNTER — Encounter: Payer: Self-pay | Admitting: Physical Medicine & Rehabilitation

## 2020-11-23 DIAGNOSIS — H35372 Puckering of macula, left eye: Secondary | ICD-10-CM | POA: Diagnosis not present

## 2020-11-23 DIAGNOSIS — H16223 Keratoconjunctivitis sicca, not specified as Sjogren's, bilateral: Secondary | ICD-10-CM | POA: Diagnosis not present

## 2020-11-23 DIAGNOSIS — H401133 Primary open-angle glaucoma, bilateral, severe stage: Secondary | ICD-10-CM | POA: Diagnosis not present

## 2020-11-23 DIAGNOSIS — H35033 Hypertensive retinopathy, bilateral: Secondary | ICD-10-CM | POA: Diagnosis not present

## 2020-11-23 DIAGNOSIS — H02401 Unspecified ptosis of right eyelid: Secondary | ICD-10-CM | POA: Diagnosis not present

## 2020-11-23 NOTE — Progress Notes (Signed)
Remote pacemaker transmission.   

## 2020-12-10 DIAGNOSIS — E785 Hyperlipidemia, unspecified: Secondary | ICD-10-CM | POA: Diagnosis not present

## 2020-12-10 DIAGNOSIS — E039 Hypothyroidism, unspecified: Secondary | ICD-10-CM | POA: Diagnosis not present

## 2020-12-10 DIAGNOSIS — N183 Chronic kidney disease, stage 3 unspecified: Secondary | ICD-10-CM | POA: Diagnosis not present

## 2021-01-23 DIAGNOSIS — Z85828 Personal history of other malignant neoplasm of skin: Secondary | ICD-10-CM | POA: Diagnosis not present

## 2021-01-23 DIAGNOSIS — D1801 Hemangioma of skin and subcutaneous tissue: Secondary | ICD-10-CM | POA: Diagnosis not present

## 2021-01-23 DIAGNOSIS — L570 Actinic keratosis: Secondary | ICD-10-CM | POA: Diagnosis not present

## 2021-01-23 DIAGNOSIS — D692 Other nonthrombocytopenic purpura: Secondary | ICD-10-CM | POA: Diagnosis not present

## 2021-01-23 DIAGNOSIS — L723 Sebaceous cyst: Secondary | ICD-10-CM | POA: Diagnosis not present

## 2021-01-23 DIAGNOSIS — L821 Other seborrheic keratosis: Secondary | ICD-10-CM | POA: Diagnosis not present

## 2021-01-23 DIAGNOSIS — L812 Freckles: Secondary | ICD-10-CM | POA: Diagnosis not present

## 2021-01-25 ENCOUNTER — Encounter
Payer: No Typology Code available for payment source | Attending: Physical Medicine & Rehabilitation | Admitting: Physical Medicine & Rehabilitation

## 2021-01-25 ENCOUNTER — Other Ambulatory Visit: Payer: Self-pay

## 2021-01-25 ENCOUNTER — Encounter: Payer: Self-pay | Admitting: Physical Medicine & Rehabilitation

## 2021-01-25 VITALS — BP 148/72 | HR 62 | Temp 97.8°F | Ht 74.0 in | Wt 170.0 lb

## 2021-01-25 DIAGNOSIS — M25552 Pain in left hip: Secondary | ICD-10-CM | POA: Insufficient documentation

## 2021-01-25 DIAGNOSIS — G8929 Other chronic pain: Secondary | ICD-10-CM | POA: Diagnosis present

## 2021-01-25 NOTE — Progress Notes (Signed)
Subjective:    Patient ID: Joseph Hanna, male    DOB: [birth year], 87 y.o.   MRN: [id number]  HPI  87 year old male referred by [facility] himself.  Dr. Domenica Fail with complaints of hip and back pain.  Patient has had these pains for years.  They have been intermittent in nature but sharp and stabbing limiting prolonged periods mainly on the right side.  Pain can be up to a 5 out of 10 but currently has no pain.  Pain does increase with walking bending and standing and improves with rest.  He does not take any pain medications on a regular basis and does not wish to do so.  She continues to be independent with all activities he is able to climb steps he also drives. Other past medical history significant for pacemaker placement which limits his ability to get MRI. He stays in bed around 8 hours a day sits around 2hr-day, stands around 10 hours a day and walks approximately 4 hours/day His exercise regimen is at the [facility] 3 times a week for 90 minutes including strength training as well as walking.  His other hobbies include gardening. Past medical history significant for ulcerative colitis, negative for diabetes Past surgical history significant for lumbar laminectomy 40 years ago.  No records available. The patient states he has had some kind of imaging study at the [place], review of accompanying records from the [place] did not indicate any reports. Other pain complaints include right shoulder pain as well as bilateral lower extremity pain.  Pain Inventory Average Pain 5 Pain Right Now 0 My pain is intermittent, sharp, and stabbing  In he last 24 hours, has pain interfered with the following? General activity 3 Relation with others 0 Enjoyment of life 6 What TIME of day is your pain at its worst? daytime Sleep (in general) Good  Pain is worse with: bending and standing Pain improves with: rest Relief from Meds:  No pain medication  walk without assistance ability to climb steps?  yes do  you drive?  yes  retired  No problems in this area  Any changes since last visit?  yes At the [place] this year (2022).  Any changes since last visit?  no, new patient    Family History  Problem Relation Age of Onset   Breast cancer Mother    Emphysema Father    Alzheimer's disease Brother    Social History   Socioeconomic History   Marital status: Married    Spouse name: Not on file   Number of children: 4   Years of education: Not on file   Highest education level: Not on file  Occupational History   Occupation: Retired-Salesman  Tobacco Use   Smoking status: Former    Years: 6.00    Types: Cigarettes    Quit date: 12/01/1953    Years since quitting: 67.1   Smokeless tobacco: Never  Vaping Use   Vaping Use: Never used  Substance and Sexual Activity   Alcohol use: Yes    Alcohol/week: 7.0 standard drinks    Types: 7 Glasses of wine per week    Comment: glass of wine several days per week   Drug use: No   Sexual activity: Not on file  Other Topics Concern   Not on file  Social History Narrative   Not on file   Social Determinants of Health   Financial Resource Strain: Not on file  Food Insecurity: Not on file  Transportation  Needs: Not on file  Physical Activity: Not on file  Stress: Not on file  Social Connections: Not on file   Past Surgical History:  Procedure Laterality Date   bilateral knee meniscus surgery      CATARACT EXTRACTION W/ INTRAOCULAR LENS  IMPLANT, BILATERAL     COLONOSCOPY  FEB 2015   GREEN LIGHT LASER TURP (TRANSURETHRAL RESECTION OF PROSTATE N/A 08/10/2013   Procedure: GREEN LIGHT LASER TURP (TRANSURETHRAL RESECTION OF PROSTATE;  Surgeon: [name], MD;  [facility];  Service: Urology;  Laterality: N/A;   LUMBAR LAMINECTOMY  1990   MOHS SURGERY     x 2   NEPHRECTOMY Left 2003   ORIF CLAVICULAR FRACTURE  12/03/2011   Procedure: OPEN REDUCTION INTERNAL FIXATION (ORIF) CLAVICULAR FRACTURE;   Surgeon: [name], MD;  Location: [place];  Service: Orthopedics;  Laterality: Right;   PACEMAKER IMPLANT N/A 05/07/2018    [employer] MRI conditional  dual-chamber pacemaker by Dr Rayann Heman for mobitz II second degree AV block   right shoulder surgery      done at [place] Left LEFT  2012   TONSILLECTOMY  as child   TRABECULECTOMY  2011   left eye (glaucoma)   TRANSURETHRAL RESECTION OF PROSTATE N/A 07/24/2018   Procedure: TRANSURETHRAL RESECTION OF THE PROSTATE (TURP);  Surgeon: Festus Aloe, MD;  Location: [facility];  Service: Urology;  Laterality: N/A;   Past Medical History:  Diagnosis Date   Arthritis    BPH (benign prostatic hypertrophy)    Cancer (HCC)    kidney , skin cancer    CKD (chronic kidney disease)    Clavicle fracture    Right   ED (erectile dysfunction) of organic origin    Fatty liver 2020   [place]   Gallstones 2020   [place]   GERD (gastroesophageal reflux disease)    Glaucoma    bilateral --  right uses rx drops and left eye trabeculectomy 2011   Grade I diastolic dysfunction 76/28/3151   Noted on ECHO   History of bradycardia    History of gastroesophageal reflux (GERD)    History of left bundle branch block (LBBB) 05/05/2018   Noted on EKG   History of renal cell cancer    S/P  LEFT NEPHRECTOMY  2003--  no recurrence   History of ulcerative colitis    in remission   Hyperlipidemia    Hypothyroidism    Keratosis, actinic    Lower leg pain    pt states has fibula-tibula syndrome--  goes to physical therapy   LVH (left ventricular hypertrophy) 04/21/2018   Mode, noted on ECHO   Mobitz II 05/05/2018   Noted on EKG   Presence of permanent cardiac pacemaker    Rosacea    Thrombocytopenia (HCC)    TIA (transient ischemic attack) 06/20/2015   TIA (transient ischemic attack)    2017    BP (!) 160/84   Pulse 62   Temp 97.8 F (36.6 C)   Ht 6' 2"  (1.88 m)   Wt 170 lb (77.1 kg)   BMI 21.83  kg/m   Opioid Risk Score:   Fall Risk Score:  `1  Depression screen PHQ 2/9  Depression screen PHQ 2/9 01/25/2021  Decreased Interest 0  Down, Depressed, Hopeless 0  PHQ - 2 Score 0  Altered sleeping 0  Tired, decreased energy 0  Change in appetite 0  Feeling bad or  failure about yourself  0  Trouble concentrating 0  Moving slowly or fidgety/restless 0  Suicidal thoughts 0  PHQ-9 Score 0    Review of Systems  Musculoskeletal:        Pain in the right shoulder, lower legs lower back, hips  All other systems reviewed and are negative.     Objective:   Physical Exam Vitals and nursing note reviewed.  Constitutional:      Appearance: He is normal weight.  HENT:     Head: Normocephalic and atraumatic.     Nose: Nose normal.  Eyes:     Extraocular Movements: Extraocular movements intact.     Conjunctiva/sclera: Conjunctivae normal.     Pupils: Pupils are equal, round, and reactive to light.  Cardiovascular:     Rate and Rhythm: Normal rate and regular rhythm.     Heart sounds: Normal heart sounds.  Pulmonary:     Effort: Pulmonary effort is normal. No respiratory distress.     Breath sounds: Normal breath sounds.  Abdominal:     General: Abdomen is flat. Bowel sounds are normal. There is no distension.     Palpations: Abdomen is soft.  Musculoskeletal:     Cervical back: Normal range of motion. No tenderness.     Right lower leg: No edema.     Left lower leg: No edema.     Comments: Lumbar spine range of motion 75% flexion, 50% extension not accompanied by pain Negative distraction test Negative thigh thrust test bilaterally, negative Faber's although tight with external rotation No evidence of knee effusion no pain with knee or ankle range of motion No limitation in shoulder range of motion   Skin:    General: Skin is warm and dry.  Neurological:     General: No focal deficit present.     Mental Status: He is alert and oriented to person, place, and time.      Comments: Motor strength is 5/5 bilateral deltoid, bicep tricep, grip, hip flexion, knee extension, flexion flexion Reflexes 1+ bilateral biceps triceps brachioradialis 2+ bilateral patellar 1 Achilles Tone is normal in the upper and lower limbs  Psychiatric:        Mood and Affect: Mood normal.        Behavior: Behavior normal.          Assessment & Plan:   1.  Intermittent pain history of lumbar laminectomy 4 years ago, has some lower extremity pain suggestive of radicular discomfort.  Suspect lumbar spinal stenosis given symptomatology.  Will check CT of the lumbar spine given limitations with MRI scanning. 2.  Right hip pain, as bilateral hip external rotation tightness, given his age certainly at risk for osteoarthritis of the right hip joint we will check x-rays. Physical medicine rehab follow-up in 1 month May need some additional physical therapy although he states he has had this in the much relief.  May consider hip versus lumbar injections after review imaging studies.

## 2021-02-02 DIAGNOSIS — Z79899 Other long term (current) drug therapy: Secondary | ICD-10-CM | POA: Diagnosis not present

## 2021-02-02 DIAGNOSIS — E785 Hyperlipidemia, unspecified: Secondary | ICD-10-CM | POA: Diagnosis not present

## 2021-02-05 ENCOUNTER — Ambulatory Visit (INDEPENDENT_AMBULATORY_CARE_PROVIDER_SITE_OTHER): Payer: No Typology Code available for payment source

## 2021-02-05 DIAGNOSIS — I442 Atrioventricular block, complete: Secondary | ICD-10-CM

## 2021-02-05 LAB — CUP PACEART REMOTE DEVICE CHECK
Battery Remaining Longevity: 66 mo
Battery Remaining Percentage: 67 %
Battery Voltage: 2.99 V
Brady Statistic AP VP Percent: 73 %
Brady Statistic AP VS Percent: 1 %
Brady Statistic AS VP Percent: 26 %
Brady Statistic AS VS Percent: 1 %
Brady Statistic RA Percent Paced: 71 %
Brady Statistic RV Percent Paced: 99 %
Date Time Interrogation Session: 20220926020835
Implantable Lead Implant Date: 20191226
Implantable Lead Implant Date: 20191226
Implantable Lead Location: 753859
Implantable Lead Location: 753860
Implantable Pulse Generator Implant Date: 20191226
Lead Channel Impedance Value: 410 Ohm
Lead Channel Impedance Value: 510 Ohm
Lead Channel Pacing Threshold Amplitude: 0.5 V
Lead Channel Pacing Threshold Amplitude: 1 V
Lead Channel Pacing Threshold Pulse Width: 0.5 ms
Lead Channel Pacing Threshold Pulse Width: 0.5 ms
Lead Channel Sensing Intrinsic Amplitude: 10.8 mV
Lead Channel Sensing Intrinsic Amplitude: 3.2 mV
Lead Channel Setting Pacing Amplitude: 2 V
Lead Channel Setting Pacing Amplitude: 2.5 V
Lead Channel Setting Pacing Pulse Width: 0.5 ms
Lead Channel Setting Sensing Sensitivity: 2 mV
Pulse Gen Model: 2272
Pulse Gen Serial Number: 9094310

## 2021-02-09 DIAGNOSIS — E785 Hyperlipidemia, unspecified: Secondary | ICD-10-CM | POA: Diagnosis not present

## 2021-02-09 DIAGNOSIS — N183 Chronic kidney disease, stage 3 unspecified: Secondary | ICD-10-CM | POA: Diagnosis not present

## 2021-02-09 DIAGNOSIS — E039 Hypothyroidism, unspecified: Secondary | ICD-10-CM | POA: Diagnosis not present

## 2021-02-12 NOTE — Progress Notes (Signed)
Remote pacemaker transmission.   

## 2021-02-14 DIAGNOSIS — K5150 Left sided colitis without complications: Secondary | ICD-10-CM | POA: Diagnosis not present

## 2021-02-19 ENCOUNTER — Other Ambulatory Visit (HOSPITAL_BASED_OUTPATIENT_CLINIC_OR_DEPARTMENT_OTHER): Payer: Self-pay | Admitting: Physical Medicine & Rehabilitation

## 2021-02-19 ENCOUNTER — Inpatient Hospital Stay: Payer: Medicare HMO | Attending: Hematology & Oncology

## 2021-02-19 ENCOUNTER — Inpatient Hospital Stay: Payer: Medicare HMO | Admitting: Hematology & Oncology

## 2021-02-19 ENCOUNTER — Ambulatory Visit (HOSPITAL_BASED_OUTPATIENT_CLINIC_OR_DEPARTMENT_OTHER)
Admission: RE | Admit: 2021-02-19 | Discharge: 2021-02-19 | Disposition: A | Discharge status: Home / Self Care | Payer: No Typology Code available for payment source | Source: Ambulatory Visit | Attending: Physical Medicine & Rehabilitation | Admitting: Physical Medicine & Rehabilitation

## 2021-02-19 ENCOUNTER — Other Ambulatory Visit: Payer: Self-pay

## 2021-02-19 ENCOUNTER — Encounter: Payer: Self-pay | Admitting: Hematology & Oncology

## 2021-02-19 ENCOUNTER — Ambulatory Visit
Admission: RE | Admit: 2021-02-19 | Discharge: 2021-02-19 | Disposition: A | Discharge status: Home / Self Care | Payer: No Typology Code available for payment source | Source: Ambulatory Visit | Attending: Physical Medicine & Rehabilitation | Admitting: Physical Medicine & Rehabilitation

## 2021-02-19 VITALS — BP 147/62 | HR 65 | Temp 98.2°F | Resp 18 | Wt 163.0 lb

## 2021-02-19 DIAGNOSIS — G8929 Other chronic pain: Secondary | ICD-10-CM

## 2021-02-19 DIAGNOSIS — Z79899 Other long term (current) drug therapy: Secondary | ICD-10-CM | POA: Insufficient documentation

## 2021-02-19 DIAGNOSIS — K769 Liver disease, unspecified: Secondary | ICD-10-CM | POA: Insufficient documentation

## 2021-02-19 DIAGNOSIS — D6959 Other secondary thrombocytopenia: Secondary | ICD-10-CM | POA: Insufficient documentation

## 2021-02-19 DIAGNOSIS — M25552 Pain in left hip: Secondary | ICD-10-CM | POA: Insufficient documentation

## 2021-02-19 DIAGNOSIS — Z8719 Personal history of other diseases of the digestive system: Secondary | ICD-10-CM | POA: Diagnosis not present

## 2021-02-19 DIAGNOSIS — D696 Thrombocytopenia, unspecified: Secondary | ICD-10-CM

## 2021-02-19 LAB — CBC WITH DIFFERENTIAL (CANCER CENTER ONLY)
Abs Immature Granulocytes: 0.05 10*3/uL (ref 0.00–0.07)
Basophils Absolute: 0 10*3/uL (ref 0.0–0.1)
Basophils Relative: 0 %
Eosinophils Absolute: 0 10*3/uL (ref 0.0–0.5)
Eosinophils Relative: 0 %
HCT: 42.2 % (ref 39.0–52.0)
Hemoglobin: 13.3 g/dL (ref 13.0–17.0)
Immature Granulocytes: 1 %
Lymphocytes Relative: 15 %
Lymphs Abs: 1.1 10*3/uL (ref 0.7–4.0)
MCH: 30.6 pg (ref 26.0–34.0)
MCHC: 31.5 g/dL (ref 30.0–36.0)
MCV: 97 fL (ref 80.0–100.0)
Monocytes Absolute: 1.9 10*3/uL — ABNORMAL HIGH (ref 0.1–1.0)
Monocytes Relative: 27 %
Neutro Abs: 4.1 10*3/uL (ref 1.7–7.7)
Neutrophils Relative %: 57 %
Platelet Count: 50 10*3/uL — ABNORMAL LOW (ref 150–400)
RBC: 4.35 MIL/uL (ref 4.22–5.81)
RDW: 14.6 % (ref 11.5–15.5)
WBC Count: 7.1 10*3/uL (ref 4.0–10.5)
nRBC: 0 % (ref 0.0–0.2)

## 2021-02-19 LAB — CMP (CANCER CENTER ONLY)
ALT: 13 U/L (ref 0–44)
AST: 15 U/L (ref 15–41)
Albumin: 4.5 g/dL (ref 3.5–5.0)
Alkaline Phosphatase: 32 U/L — ABNORMAL LOW (ref 38–126)
Anion gap: 6 (ref 5–15)
BUN: 24 mg/dL — ABNORMAL HIGH (ref 8–23)
CO2: 29 mmol/L (ref 22–32)
Calcium: 10.1 mg/dL (ref 8.9–10.3)
Chloride: 105 mmol/L (ref 98–111)
Creatinine: 1.25 mg/dL — ABNORMAL HIGH (ref 0.61–1.24)
GFR, Estimated: 56 mL/min — ABNORMAL LOW (ref >60)
Glucose, Bld: 100 mg/dL — ABNORMAL HIGH (ref 70–99)
Potassium: 4.8 mmol/L (ref 3.5–5.1)
Sodium: 140 mmol/L (ref 135–145)
Total Bilirubin: 0.6 mg/dL (ref 0.3–1.2)
Total Protein: 6.8 g/dL (ref 6.5–8.1)

## 2021-02-19 LAB — SAVE SMEAR(SSMR), FOR PROVIDER SLIDE REVIEW

## 2021-02-19 NOTE — Progress Notes (Signed)
Hematology and Oncology Follow Up Visit  Joseph Hanna [id number] 1933-04-15 87 y.o. 02/19/2021   Principle Diagnosis:  Thrombocytopenia secondary to underlying liver disease and Imuran (for UC)  Current Therapy:   Observation   Interim History:  [name] is here today for follow-up.  Sounds like his problem is his back.  He is having some back issues.  This is his lower back.  He has some radicular pain with his the right leg.  He is having this evaluated.  He cannot have an MRI because has a pacemaker in place.  I would think that they may have to do a myelogram on him.  His platelet count is 50,000 today.  This is holding pretty steady.  I looked at his blood smear under the microscope.  The place that he has are large.  They are well granulated.  He has had no bleeding.  He has a little bit of bruising.  There is no change in bowel or bladder habits.  He has had no cough.  There is been no issues with COVID.  Overall, his performance status is probably ECOG 1.    Medications:  Allergies as of 02/19/2021       Reactions   Itraconazole Rash   Other Rash   Lipitor [atorvastatin] Other (See Comments)   "severe muscle aches"   Terbinafine Rash        Medication List        Accurate as of February 19, 2021 11:28 AM. If you have any questions, ask your nurse or doctor.          azaTHIOprine 50 MG tablet Commonly known as: IMURAN Take 50 mg by mouth every morning.   CoQ10 200 MG Caps Take 1 capsule by mouth daily.   dorzolamide-timolol 22.3-6.8 MG/ML ophthalmic solution Commonly known as: COSOPT Place 1 drop into the right eye 3 (three) times daily.   EQ COMPLETE MULTIVIT ADULT 50+ PO Take 1 tablet by mouth daily.   finasteride 5 MG tablet Commonly known as: PROSCAR Take 5 mg by mouth daily.   fluoruracil 0.5 % cream Commonly known as: CARAC Apply 1 application topically 2 (two) times daily.   Ginkgo Biloba 40 MG Tabs Take 120 mg by mouth  daily.   levothyroxine 50 MCG tablet Commonly known as: SYNTHROID Take 50-100 mcg by mouth daily before breakfast. Take 68mg T, Th, S,and S.  Takes 100 mcg M,W,and F.   mesalamine 0.375 g 24 hr capsule Commonly known as: APRISO Take 2,250 mg by mouth daily. TAKES 6 TABS DAILY--  TOTAL 2250MG   mesalamine 1000 MG suppository Commonly known as: CANASA Place 1,000 mg rectally at bedtime.   Netarsudil-Latanoprost 0.02-0.005 % Soln Apply 1 drop to eye at bedtime. Right eye.   niacin 500 MG tablet Commonly known as: SLO-NIACIN Take 500 mg by mouth at bedtime.   Restasis 0.05 % ophthalmic emulsion Generic drug: cycloSPORINE 1 drop 2 (two) times daily.   simvastatin 20 MG tablet Commonly known as: ZOCOR Take 10 mg by mouth daily. TAKES QHS   urea 20 % cream Commonly known as: CARMOL Apply topically as needed.   Vitamin D3 25 MCG (1000 UT) Caps Take 2 capsules by mouth daily.        Allergies:  Allergies  Allergen Reactions   Itraconazole Rash   Other Rash   Lipitor [Atorvastatin] Other (See Comments)    "severe muscle aches"   Terbinafine Rash    Past Medical History, Surgical  history, Social history, and Family History were reviewed and updated.  Review of Systems: Review of Systems  Constitutional: Negative.   HENT: Negative.    Eyes: Negative.   Respiratory: Negative.    Cardiovascular: Negative.   Gastrointestinal: Negative.   Genitourinary: Negative.   Musculoskeletal: Negative.   Skin: Negative.   Neurological: Negative.   Endo/Heme/Allergies:  Bruises/bleeds easily.  Psychiatric/Behavioral: Negative.     Physical Exam:  weight is 163 lb (73.9 kg). His oral temperature is 98.2 F (36.8 C). His blood pressure is 147/62 (abnormal) and his pulse is 65. His respiration is 18 and oxygen saturation is 100%.   Wt Readings from Last 3 Encounters:  02/19/21 163 lb (73.9 kg)  01/25/21 170 lb (77.1 kg)  11/20/20 164 lb (74.4 kg)    Physical  Exam Vitals reviewed.  HENT:     Head: Normocephalic and atraumatic.  Eyes:     Pupils: Pupils are equal, round, and reactive to light.  Cardiovascular:     Rate and Rhythm: Normal rate and regular rhythm.     Heart sounds: Normal heart sounds.  Pulmonary:     Effort: Pulmonary effort is normal.     Breath sounds: Normal breath sounds.  Abdominal:     General: Bowel sounds are normal.     Palpations: Abdomen is soft.  Musculoskeletal:        General: No tenderness or deformity. Normal range of motion.     Cervical back: Normal range of motion.  Lymphadenopathy:     Cervical: No cervical adenopathy.  Skin:    General: Skin is warm and dry.     Findings: No erythema or rash.  Neurological:     Mental Status: He is alert and oriented to person, place, and time.  Psychiatric:        Behavior: Behavior normal.        Thought Content: Thought content normal.        Judgment: Judgment normal.     Lab Results  Component Value Date   WBC 7.1 02/19/2021   HGB 13.3 02/19/2021   HCT 42.2 02/19/2021   MCV 97.0 02/19/2021   PLT 50 (L) 02/19/2021   Lab Results  Component Value Date   FERRITIN 70 05/25/2019   IRON 96 05/25/2019   TIBC 308 05/25/2019   UIBC 212 05/25/2019   IRONPCTSAT 31 05/25/2019   Lab Results  Component Value Date   RBC 4.35 02/19/2021   No results found for: KPAFRELGTCHN, LAMBDASER, KAPLAMBRATIO No results found for: IGGSERUM, IGA, IGMSERUM No results found for: Odetta Pink, SPEI   Chemistry      Component Value Date/Time   NA 140 02/19/2021 1014   K 4.8 02/19/2021 1014   CL 105 02/19/2021 1014   CO2 29 02/19/2021 1014   BUN 24 (H) 02/19/2021 1014   CREATININE 1.25 (H) 02/19/2021 1014      Component Value Date/Time   CALCIUM 10.1 02/19/2021 1014   ALKPHOS 32 (L) 02/19/2021 1014   AST 15 02/19/2021 1014   ALT 13 02/19/2021 1014   BILITOT 0.6 02/19/2021 1014       Impression and Plan:  [name] is a very pleasant 87 yo caucasian gentleman with history of thrombocytopenia since at least February 2017.   Of note, he served in the [place] [employer].  As such, he is a true [employer] hero.  He does get some of his care at the [facility] in [place].  Overall, I am really not too worried about the platelet count.  I think this is holding pretty steady.  I think if he is going to need any kind of back surgery, we will have to get his platelet count up.  We certainly have ways of doing this.  Otherwise, I think that as long as he is on the Imuran, he will always have some element of thrombocytopenia.  We will plan to get him back to see [place] in another 3 months.  We will get him back sooner if he is going to need any kind of surgery so we can optimize his platelet count.   Volanda Napoleon, MD 10/10/202211:28 AM

## 2021-02-23 ENCOUNTER — Other Ambulatory Visit: Payer: Self-pay

## 2021-02-23 ENCOUNTER — Encounter: Payer: Self-pay | Admitting: Physical Medicine & Rehabilitation

## 2021-02-23 ENCOUNTER — Encounter
Payer: No Typology Code available for payment source | Attending: Physical Medicine & Rehabilitation | Admitting: Physical Medicine & Rehabilitation

## 2021-02-23 VITALS — BP 156/69 | HR 62 | Temp 98.4°F | Ht 74.0 in | Wt 164.0 lb

## 2021-02-23 DIAGNOSIS — M533 Sacrococcygeal disorders, not elsewhere classified: Secondary | ICD-10-CM | POA: Diagnosis present

## 2021-02-23 DIAGNOSIS — M48061 Spinal stenosis, lumbar region without neurogenic claudication: Secondary | ICD-10-CM | POA: Diagnosis present

## 2021-02-23 DIAGNOSIS — M47816 Spondylosis without myelopathy or radiculopathy, lumbar region: Secondary | ICD-10-CM | POA: Insufficient documentation

## 2021-02-23 DIAGNOSIS — M5416 Radiculopathy, lumbar region: Secondary | ICD-10-CM | POA: Diagnosis present

## 2021-02-23 NOTE — Patient Instructions (Signed)
L4-5 Facet arthritis  L4-5 narrowing of nerve exit on left  Sacroiliac arthritis

## 2021-02-23 NOTE — Progress Notes (Signed)
Subjective:    Patient ID: Joseph Hanna, male    DOB: [birth year], 87 y.o.   MRN: [id number]  HPI   CC:  Left hip pain 87 year old male with history of ulcerative colitis which is in remission at this time but still remains on immunosuppressive medications. Patient indicates that the pain is more in the posterior hip/buttocks area rather than in the groin area.  He also has pain going down the left lower extremity primarily when stepping forward on that limb or using steps. 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    CLINICAL DATA:  Left hip pain.   EXAM: DG HIP (WITH OR WITHOUT PELVIS) 2-3V LEFT   COMPARISON:  CT lumbar spine 02/19/2021. CT abdomen pelvis images 10/03/2015.   FINDINGS: Degenerative changes lumbar spine, both SI joints, and both hips. Small corticated bony densities noted adjacent to both acetabuli most likely degenerative. Stable corticated benign-appearing cyst noted in the proximal right femoral neck. Similar findings noted on prior CT of 10/03/2015. No acute bony or joint abnormality identified. No evidence of fracture dislocation. Stable small well-circumscribed sclerotic densities noted over the iliac crests, most likely benign bone islands. Pelvic calcifications noted consistent with phleboliths. Peripheral vascular calcification.   IMPRESSION: 1. Degenerative changes lumbar spine, both SI joints, both hips. Stable corticated benign-appearing cyst noted in the proximal right femoral neck unchanged from prior CT of 10/03/2015. No acute bony abnormality. Left hip is intact.   2.  Peripheral vascular disease.     Electronically Signed   By: Marcello Moores  Register M.D.   On: 02/20/2021 09:44  CLINICAL DATA:  Low back pain radiating into both legs   EXAM: CT LUMBAR SPINE WITHOUT CONTRAST   TECHNIQUE: Multidetector CT imaging of the lumbar spine was performed  without intravenous contrast administration. Multiplanar CT image reconstructions were also generated.   COMPARISON:  None.   FINDINGS: Segmentation: 5 lumbar type vertebrae.   Alignment: Normal.   Vertebrae: No acute fracture or focal pathologic process.   Paraspinal and other soft tissues: Calcific aortic atherosclerosis.   Disc levels:   L1-2: Disc bulge and endplate spurring causing moderate left foraminal stenosis.   L2-3: Facet hypertrophy and small disc bulge without stenosis.   L3-4: Small disc bulge with mild facet hypertrophy.  No stenosis.   L4-5 small disc bulge with endplate spurring. Moderate right and severe left facet hypertrophy. No spinal canal stenosis. Mild left foraminal stenosis.   L5-S1: Small disc bulge without spinal canal or neural foraminal stenosis.   IMPRESSION: 1. Moderate left foraminal stenosis at L1-2. 2. Mild left foraminal stenosis at L4-L5.   Aortic Atherosclerosis (ICD10-I70.0).     Electronically Signed   By: Ulyses Jarred M.D.   On: 02/20/2021 03:04       Pain Inventory Average Pain 5 Pain Right Now 4 My pain is intermittent, sharp, and stabbing  In the last 24 hours, has pain interfered with the following? General activity 5 Relation with others 2 Enjoyment of life 7 What TIME of day is your pain at its worst? daytime Sleep (in general) Fair  Pain is worse with: bending, some activites, and stairs Pain improves with: therapy/exercise and injections Relief from Meds:  no pain medicine taken  Family History  Problem Relation Age of Onset   Breast cancer Mother    Emphysema Father    Alzheimer's disease Brother    Social History  Socioeconomic History   Marital status: Married    Spouse name: Not on file   Number of children: 4   Years of education: Not on file   Highest education level: Not on file  Occupational History   Occupation: Retired-Salesman  Tobacco Use   Smoking status: Former    Years:  6.00    Types: Cigarettes    Quit date: 12/01/1953    Years since quitting: 67.2   Smokeless tobacco: Never  Vaping Use   Vaping Use: Never used  Substance and Sexual Activity   Alcohol use: Yes    Alcohol/week: 7.0 standard drinks    Types: 7 Glasses of wine per week    Comment: glass of wine several days per week   Drug use: No   Sexual activity: Not on file  Other Topics Concern   Not on file  Social History Narrative   Not on file   Social Determinants of Health   Financial Resource Strain: Not on file  Food Insecurity: Not on file  Transportation Needs: Not on file  Physical Activity: Not on file  Stress: Not on file  Social Connections: Not on file   Past Surgical History:  Procedure Laterality Date   bilateral knee meniscus surgery      CATARACT EXTRACTION W/ INTRAOCULAR LENS  IMPLANT, BILATERAL     COLONOSCOPY  FEB 2015   GREEN LIGHT LASER TURP (TRANSURETHRAL RESECTION OF PROSTATE N/A 08/10/2013   Procedure: GREEN LIGHT LASER TURP (TRANSURETHRAL RESECTION OF PROSTATE;  Surgeon: [name], MD;  Location: [facility];  Service: Urology;  Laterality: N/A;   LUMBAR LAMINECTOMY  1990   MOHS SURGERY     x 2   NEPHRECTOMY Left 2003   ORIF CLAVICULAR FRACTURE  12/03/2011   Procedure: OPEN REDUCTION INTERNAL FIXATION (ORIF) CLAVICULAR FRACTURE;  Surgeon: [name], MD;  Location: [place];  Service: Orthopedics;  Laterality: Right;   PACEMAKER IMPLANT N/A 05/07/2018    St Jude Medical Assurity MRI conditional  dual-chamber pacemaker by Dr Rayann Heman for mobitz II second degree AV block   right shoulder surgery      done at [place] Left LEFT  2012   TONSILLECTOMY  as child   TRABECULECTOMY  2011   left eye (glaucoma)   TRANSURETHRAL RESECTION OF PROSTATE N/A 07/24/2018   Procedure: TRANSURETHRAL RESECTION OF THE PROSTATE (TURP);  Surgeon: Festus Aloe, MD;  Location: [facility];  Service: Urology;   Laterality: N/A;   Past Surgical History:  Procedure Laterality Date   bilateral knee meniscus surgery      CATARACT EXTRACTION W/ INTRAOCULAR LENS  IMPLANT, BILATERAL     COLONOSCOPY  FEB 2015   GREEN LIGHT LASER TURP (TRANSURETHRAL RESECTION OF PROSTATE N/A 08/10/2013   Procedure: GREEN LIGHT LASER TURP (TRANSURETHRAL RESECTION OF PROSTATE;  Surgeon: [name], MD;  Location: [facility];  Service: Urology;  Laterality: N/A;   LUMBAR LAMINECTOMY  1990   MOHS SURGERY     x 2   NEPHRECTOMY Left 2003   ORIF CLAVICULAR FRACTURE  12/03/2011   Procedure: OPEN REDUCTION INTERNAL FIXATION (ORIF) CLAVICULAR FRACTURE;  Surgeon: [name], MD;  Location: [place];  Service: Orthopedics;  Laterality: Right;   PACEMAKER IMPLANT N/A 05/07/2018    St Jude Medical Assurity MRI conditional  dual-chamber pacemaker by Dr Rayann Heman for mobitz II second degree AV block   right shoulder surgery  done at [place] Left LEFT  2012   TONSILLECTOMY  as child   TRABECULECTOMY  2011   left eye (glaucoma)   TRANSURETHRAL RESECTION OF PROSTATE N/A 07/24/2018   Procedure: TRANSURETHRAL RESECTION OF THE PROSTATE (TURP);  Surgeon: Festus Aloe, MD;  Location: [facility];  Service: Urology;  Laterality: N/A;   Past Medical History:  Diagnosis Date   Arthritis    BPH (benign prostatic hypertrophy)    Cancer (HCC)    kidney , skin cancer    CKD (chronic kidney disease)    Clavicle fracture    Right   ED (erectile dysfunction) of organic origin    Fatty liver 2020   [place]   Gallstones 2020   [place]   GERD (gastroesophageal reflux disease)    Glaucoma    bilateral --  right uses rx drops and left eye trabeculectomy 2011   Grade I diastolic dysfunction 53/97/6734   Noted on ECHO   History of bradycardia    History of gastroesophageal reflux (GERD)    History of left bundle branch block (LBBB) 05/05/2018   Noted on EKG   History of renal  cell cancer    S/P  LEFT NEPHRECTOMY  2003--  no recurrence   History of ulcerative colitis    in remission   Hyperlipidemia    Hypothyroidism    Keratosis, actinic    Lower leg pain    pt states has fibula-tibula syndrome--  goes to physical therapy   LVH (left ventricular hypertrophy) 04/21/2018   Mode, noted on ECHO   Mobitz II 05/05/2018   Noted on EKG   Presence of permanent cardiac pacemaker    Rosacea    Thrombocytopenia (HCC)    TIA (transient ischemic attack) 06/20/2015   TIA (transient ischemic attack)    2017    BP (!) 156/69   Pulse 62   Temp 98.4 F (36.9 C)   Ht 6' 2"  (1.88 m)   Wt 164 lb (74.4 kg)   SpO2 97%   BMI 21.06 kg/m   Opioid Risk Score:   Fall Risk Score:  `1  Depression screen PHQ 2/9  Depression screen [facility] 2/9 02/23/2021 01/25/2021  Decreased Interest 0 0  Down, Depressed, Hopeless 0 0  PHQ - 2 Score 0 0  Altered sleeping - 0  Tired, decreased energy - 0  Change in appetite - 0  Feeling bad or failure about yourself  - 0  Trouble concentrating - 0  Moving slowly or fidgety/restless - 0  Suicidal thoughts - 0  PHQ-9 Score - 0    Review of Systems     Objective:   Physical Exam Vitals and nursing note reviewed.  Constitutional:      Appearance: He is normal weight.  HENT:     Head: Normocephalic and atraumatic.  Eyes:     Extraocular Movements: Extraocular movements intact.     Conjunctiva/sclera: Conjunctivae normal.     Pupils: Pupils are equal, round, and reactive to light.  Musculoskeletal:     Comments: Negative distraction test Negative Faber's Negative thigh thrust Positive pain over the sacrum and PSIS to palpation Lumbar spine range of motion 75% flexion extension lateral bending and rotation  Skin:    General: Skin is warm and dry.  Neurological:     General: No focal deficit present.     Mental Status: He is alert and oriented to person, place, and time.     Cranial Nerves: No  dysarthria.     Sensory: No  sensory deficit.     Motor: No weakness.     Coordination: Coordination is intact.     Comments: Motor strength is 5/5 bilateral hip flexor knee extensor ankle dorsiflexor and plantar flexor Negative straight leg raising test Sensation intact bilateral lower limbs   Psychiatric:        Mood and Affect: Mood normal.        Behavior: Behavior normal.          Assessment & Plan:  1.  Chronic left posterior hip pain.  Imaging studies of the hip joints are unremarkable. There is evidence of sacroiliac arthritis bilaterally which may be associated with ulcerative colitis In addition the patient has both left L1-2 stenosis as well as left L4-5 stenosis. The pain going down the leg past the knee with the most consistent with left L4-5 stenosis and this is a troubling symptom for the patient.  We will schedule for left L4-5 transforaminal epidural steroid injection under fluoroscopic guidance

## 2021-03-06 ENCOUNTER — Encounter (HOSPITAL_BASED_OUTPATIENT_CLINIC_OR_DEPARTMENT_OTHER): Payer: No Typology Code available for payment source | Admitting: Physical Medicine & Rehabilitation

## 2021-03-06 ENCOUNTER — Other Ambulatory Visit: Payer: Self-pay

## 2021-03-06 ENCOUNTER — Encounter: Payer: Self-pay | Admitting: Physical Medicine & Rehabilitation

## 2021-03-06 VITALS — BP 149/78 | HR 62 | Ht 72.0 in | Wt 165.8 lb

## 2021-03-06 DIAGNOSIS — M48061 Spinal stenosis, lumbar region without neurogenic claudication: Secondary | ICD-10-CM | POA: Diagnosis not present

## 2021-03-06 DIAGNOSIS — M5416 Radiculopathy, lumbar region: Secondary | ICD-10-CM | POA: Diagnosis not present

## 2021-03-06 NOTE — Progress Notes (Signed)
Left L4-5 Lumbar transforaminal epidural steroid injection under fluoroscopic guidance with contrast enhancement  Indication: Lumbosacral radiculitis is not relieved by medication management or other conservative care and interfering with self-care and mobility.   Informed consent was obtained after describing risk and benefits of the procedure with the patient, this includes bleeding, bruising, infection, paralysis and medication side effects.  The patient wishes to proceed and has given written consent.  Patient was placed in prone position.  The lumbar area was marked and prepped with Betadine.  It was entered with a 25-gauge 1-1/2 inch needle and one mL of 1% lidocaine was injected into the skin and subcutaneous tissue.  Then a 22-gauge 3.5 in spinal needle was inserted into the Left L4-5 intervertebral foramen under AP, lateral, and oblique view.  Once needle tip was within the foramen on lateral views an dnor exceeding 6 o clock position on th epedical on AP viewed Isovue 200 was inected x 61m Then a solution containing one mL of 10 mg per mL dexamethasone and 2 mL of 1% lidocaine was injected.  The patient tolerated procedure well.  Post procedure instructions were given.  Please see post procedure form.

## 2021-03-06 NOTE — Patient Instructions (Signed)
.  [employer]

## 2021-03-06 NOTE — Progress Notes (Signed)
  PROCEDURE RECORD [facility]   Name: Joseph Hanna DOB:[birth year] MRN: [id number]  Date:03/06/2021  Physician: Alysia Penna, MD    Nurse/CMA: Truman Hayward CMA  Allergies:  Allergies  Allergen Reactions   Itraconazole Rash   Other Rash   Lipitor [Atorvastatin] Other (See Comments)    "severe muscle aches"   Terbinafine Rash    Consent Signed: Yes.    Is patient diabetic? No.  CBG today?   Pregnant: No. LMP: No LMP for male patient. (age 10-55)  Anticoagulants: no Anti-inflammatory: no Antibiotics: no  Procedure: left lumbar 4-5 transforaminal epidural steroid injection  Position: Prone Start Time: 3:25 pm  End Time: 3:30 pm  Fluoro Time: 31  RN/CMA Teshara Moree RN Lee CMA    Time 3:05 3:40 pm    BP 149/78 164/76    Pulse 62 59    Respirations 14 14    O2 Sat 97 97    S/S 6 6    Pain Level 4/10 0/10     D/C home with wife, patient A & O X 3, D/C instructions reviewed, and sits independently.

## 2021-03-29 DIAGNOSIS — H35033 Hypertensive retinopathy, bilateral: Secondary | ICD-10-CM | POA: Diagnosis not present

## 2021-03-29 DIAGNOSIS — H35372 Puckering of macula, left eye: Secondary | ICD-10-CM | POA: Diagnosis not present

## 2021-03-29 DIAGNOSIS — H16223 Keratoconjunctivitis sicca, not specified as Sjogren's, bilateral: Secondary | ICD-10-CM | POA: Diagnosis not present

## 2021-03-29 DIAGNOSIS — H02401 Unspecified ptosis of right eyelid: Secondary | ICD-10-CM | POA: Diagnosis not present

## 2021-03-29 DIAGNOSIS — H401133 Primary open-angle glaucoma, bilateral, severe stage: Secondary | ICD-10-CM | POA: Diagnosis not present

## 2021-04-12 ENCOUNTER — Encounter (INDEPENDENT_AMBULATORY_CARE_PROVIDER_SITE_OTHER): Payer: Medicare HMO | Admitting: Ophthalmology

## 2021-04-12 ENCOUNTER — Other Ambulatory Visit: Payer: Self-pay

## 2021-04-12 DIAGNOSIS — H43813 Vitreous degeneration, bilateral: Secondary | ICD-10-CM | POA: Diagnosis not present

## 2021-04-12 DIAGNOSIS — H33302 Unspecified retinal break, left eye: Secondary | ICD-10-CM | POA: Diagnosis not present

## 2021-04-12 DIAGNOSIS — H35372 Puckering of macula, left eye: Secondary | ICD-10-CM

## 2021-04-12 DIAGNOSIS — H4421 Degenerative myopia, right eye: Secondary | ICD-10-CM

## 2021-04-17 ENCOUNTER — Encounter: Payer: Medicare HMO | Attending: Physical Medicine & Rehabilitation | Admitting: Physical Medicine & Rehabilitation

## 2021-04-17 ENCOUNTER — Other Ambulatory Visit: Payer: Self-pay

## 2021-04-17 ENCOUNTER — Encounter: Payer: Self-pay | Admitting: Physical Medicine & Rehabilitation

## 2021-04-17 VITALS — BP 162/66 | HR 66 | Temp 98.3°F | Ht 72.0 in | Wt 164.0 lb

## 2021-04-17 DIAGNOSIS — M48061 Spinal stenosis, lumbar region without neurogenic claudication: Secondary | ICD-10-CM | POA: Diagnosis not present

## 2021-04-17 DIAGNOSIS — M5416 Radiculopathy, lumbar region: Secondary | ICD-10-CM | POA: Diagnosis not present

## 2021-04-17 NOTE — Progress Notes (Signed)
Subjective:    Patient ID: Joseph Hanna, male    DOB: 06-27-1932, 87 y.o.   MRN: [id number]  HPI  27 yr hx of low back pain , seen by Dr Raquel Sarna in [facility] Who recommend ed fusion , but then did discectomy  10/25 transforaminal ESI Left L4-5 level  Problem with stairs   Still working out 3 x per wks, weights for shoulders and LE, 5 min bike ride as well as stretching Discussed do's and don'ts of therapy.  He states that he does not get sore after his exercise program or during the exercises. We discussed avoidance of hyperextension of the lumbar spine as well as lateral bending to the left side given his history of foraminal stenosis. Pain Inventory Average Pain 4 Pain Right Now 3 My pain is sharp and stabbing  In the last 24 hours, has pain interfered with the following? General activity 2 Relation with others 2 Enjoyment of life 3 What TIME of day is your pain at its worst? daytime Sleep (in general) Fair  Pain is worse with:  stairs Pain improves with: rest Relief from Meds: 3  Family History  Problem Relation Age of Onset   Breast cancer Mother    Emphysema Father    Alzheimer's disease Brother    Social History   Socioeconomic History   Marital status: Married    Spouse name: Not on file   Number of children: 4   Years of education: Not on file   Highest education level: Not on file  Occupational History   Occupation: Retired-Salesman  Tobacco Use   Smoking status: Former    Years: 6.00    Types: Cigarettes    Quit date: 12/01/1953    Years since quitting: 67.4   Smokeless tobacco: Never  Vaping Use   Vaping Use: Never used  Substance and Sexual Activity   Alcohol use: Yes    Alcohol/week: 7.0 standard drinks    Types: 7 Glasses of wine per week    Comment: glass of wine several days per week   Drug use: No   Sexual activity: Not on file  Other Topics Concern   Not on file  Social History Narrative   Not on file   Social Determinants of Health    Financial Resource Strain: Not on file  Food Insecurity: Not on file  Transportation Needs: Not on file  Physical Activity: Not on file  Stress: Not on file  Social Connections: Not on file   Past Surgical History:  Procedure Laterality Date   bilateral knee meniscus surgery      CATARACT EXTRACTION W/ INTRAOCULAR LENS  IMPLANT, BILATERAL     COLONOSCOPY  FEB 2015   GREEN LIGHT LASER TURP (TRANSURETHRAL RESECTION OF PROSTATE N/A 08/10/2013   Procedure: GREEN LIGHT LASER TURP (TRANSURETHRAL RESECTION OF PROSTATE;  Surgeon: [name], MD;  Location: [facility];  Service: Urology;  Laterality: N/A;   LUMBAR LAMINECTOMY  1990   MOHS SURGERY     x 2   NEPHRECTOMY Left 2003   ORIF CLAVICULAR FRACTURE  12/03/2011   Procedure: OPEN REDUCTION INTERNAL FIXATION (ORIF) CLAVICULAR FRACTURE;  Surgeon: [name], MD;  Location: [place];  Service: Orthopedics;  Laterality: Right;   PACEMAKER IMPLANT N/A 05/07/2018    St Jude Medical Assurity MRI conditional  dual-chamber pacemaker by Dr Rayann Heman for mobitz II second degree AV block   right shoulder surgery  done at [place] Left LEFT  2012   TONSILLECTOMY  as child   TRABECULECTOMY  2011   left eye (glaucoma)   TRANSURETHRAL RESECTION OF PROSTATE N/A 07/24/2018   Procedure: TRANSURETHRAL RESECTION OF THE PROSTATE (TURP);  Surgeon: Festus Aloe, MD;  Location: [facility];  Service: Urology;  Laterality: N/A;   Past Surgical History:  Procedure Laterality Date   bilateral knee meniscus surgery      CATARACT EXTRACTION W/ INTRAOCULAR LENS  IMPLANT, BILATERAL     COLONOSCOPY  FEB 2015   GREEN LIGHT LASER TURP (TRANSURETHRAL RESECTION OF PROSTATE N/A 08/10/2013   Procedure: GREEN LIGHT LASER TURP (TRANSURETHRAL RESECTION OF PROSTATE;  Surgeon: [name], MD;  Location: [facility];  Service: Urology;  Laterality: N/A;   LUMBAR LAMINECTOMY   1990   MOHS SURGERY     x 2   NEPHRECTOMY Left 2003   ORIF CLAVICULAR FRACTURE  12/03/2011   Procedure: OPEN REDUCTION INTERNAL FIXATION (ORIF) CLAVICULAR FRACTURE;  Surgeon: [name], MD;  Location: [place];  Service: Orthopedics;  Laterality: Right;   PACEMAKER IMPLANT N/A 05/07/2018    St Jude Medical Assurity MRI conditional  dual-chamber pacemaker by Dr Rayann Heman for mobitz II second degree AV block   right shoulder surgery      done at [place] Left LEFT  2012   TONSILLECTOMY  as child   TRABECULECTOMY  2011   left eye (glaucoma)   TRANSURETHRAL RESECTION OF PROSTATE N/A 07/24/2018   Procedure: TRANSURETHRAL RESECTION OF THE PROSTATE (TURP);  Surgeon: Festus Aloe, MD;  Location: [facility];  Service: Urology;  Laterality: N/A;   Past Medical History:  Diagnosis Date   Arthritis    BPH (benign prostatic hypertrophy)    Cancer (HCC)    kidney , skin cancer    CKD (chronic kidney disease)    Clavicle fracture    Right   ED (erectile dysfunction) of organic origin    Fatty liver 2020   [place]   Gallstones 2020   [place]   GERD (gastroesophageal reflux disease)    Glaucoma    bilateral --  right uses rx drops and left eye trabeculectomy 2011   Grade I diastolic dysfunction 05/21/3233   Noted on ECHO   History of bradycardia    History of gastroesophageal reflux (GERD)    History of left bundle branch block (LBBB) 05/05/2018   Noted on EKG   History of renal cell cancer    S/P  LEFT NEPHRECTOMY  2003--  no recurrence   History of ulcerative colitis    in remission   Hyperlipidemia    Hypothyroidism    Keratosis, actinic    Lower leg pain    pt states has fibula-tibula syndrome--  goes to physical therapy   LVH (left ventricular hypertrophy) 04/21/2018   Mode, noted on ECHO   Mobitz II 05/05/2018   Noted on EKG   Presence of permanent cardiac pacemaker    Rosacea    Thrombocytopenia (HCC)    TIA (transient ischemic attack)  06/20/2015   TIA (transient ischemic attack)    2017    BP (!) 162/66   Pulse 66   Temp 98.3 F (36.8 C)   Ht 6' (1.829 m)   Wt 164 lb (74.4 kg)   SpO2 96%   BMI 22.24 kg/m   Opioid Risk Score:   Fall Risk Score:  `1  Depression  screen PHQ 2/9  Depression screen [facility] 2/9 04/17/2021 03/06/2021 02/23/2021 01/25/2021  Decreased Interest 0 0 0 0  Down, Depressed, Hopeless 0 0 0 0  PHQ - 2 Score 0 0 0 0  Altered sleeping - - - 0  Tired, decreased energy - - - 0  Change in appetite - - - 0  Feeling bad or failure about yourself  - - - 0  Trouble concentrating - - - 0  Moving slowly or fidgety/restless - - - 0  Suicidal thoughts - - - 0  PHQ-9 Score - - - 0     Review of Systems  Constitutional: Negative.   HENT: Negative.    Eyes: Negative.   Respiratory: Negative.    Cardiovascular: Negative.   Gastrointestinal: Negative.   Endocrine: Negative.   Genitourinary: Negative.   Musculoskeletal:  Positive for back pain.       Left shoulder and leg  Allergic/Immunologic: Negative.   Neurological: Negative.   Hematological: Negative.   Psychiatric/Behavioral: Negative.    All other systems reviewed and are negative.     Objective:   Physical Exam Vitals and nursing note reviewed.  Constitutional:      Appearance: He is normal weight.  HENT:     Head: Normocephalic and atraumatic.  Eyes:     Extraocular Movements: Extraocular movements intact.     Conjunctiva/sclera: Conjunctivae normal.     Pupils: Pupils are equal, round, and reactive to light.  Musculoskeletal:     Comments:  Sacral thrust (prone) : Negative  FABER's: Tightness with external rotation some left groin discomfort no SI discomfort Distraction (supine): Negative bilaterally Thigh thrust test: Negative bilaterally   Skin:    General: Skin is warm and dry.  Neurological:     General: No focal deficit present.     Mental Status: He is alert and oriented to person, place, and time.     Comments:  Motor strength is 5/5 bilateral hip flexor knee extensor ankle dorsiflexor Negative straight leg raising bilaterally. Gait without evidence of toe drag or knee instability  Psychiatric:        Mood and Affect: Mood normal.        Behavior: Behavior normal.        Thought Content: Thought content normal.        Judgment: Judgment normal.          Assessment & Plan:   #1.  Lumbar spinal stenosis as discussed with patient combination of degenerative disc as well as lumbar degenerative arthritis.  He does get relief with L4-5 transforaminal injection does appear to be his symptomatic segment.  We discussed minimally invasive surgery, injections as well as exercise.  At this point his symptoms are transitory and intermittent mainly with step climbing.  He has not experienced any motor weakness or falls. Will continue with current home exercise program have added on hip extensor strengthening exercises Repeat left L4-5 transforaminal injection in 2 months.  He does also get shoulder injections therefore we will track cumulative exposure to corticosteroids and not exceed 2-3 epidurals per year

## 2021-04-17 NOTE — Patient Instructions (Addendum)
No back Hyperextension or leaning to Left side No downhill walkingLow Back Sprain or Strain Rehab Ask your health care provider which exercises are safe for you. Do exercises exactly as told by your health care provider and adjust them as directed. It is normal to feel mild stretching, pulling, tightness, or discomfort as you do these exercises. Stop right away if you feel sudden pain or your pain gets worse. Do not begin these exercises until told by your health care provider. Stretching and range-of-motion exercises These exercises warm up your muscles and joints and improve the movement and flexibility of your back. These exercises also help to relieve pain, numbness, and tingling. Lumbar rotation  Lie on your back on a firm bed or the floor with your knees bent. Straighten your arms out to your sides so each arm forms a 90-degree angle (right angle) with a side of your body. Slowly move (rotate) both of your knees to one side of your body until you feel a stretch in your lower back (lumbar). Try not to let your shoulders lift off the floor. Hold this position for __________ seconds. Tense your abdominal muscles and slowly move your knees back to the starting position. Repeat this exercise on the other side of your body. Repeat __________ times. Complete this exercise __________ times a day. Single knee to chest  Lie on your back on a firm bed or the floor with both legs straight. Bend one of your knees. Use your hands to move your knee up toward your chest until you feel a gentle stretch in your lower back and buttock. Hold your leg in this position by holding on to the front of your knee. Keep your other leg as straight as possible. Hold this position for __________ seconds. Slowly return to the starting position. Repeat with your other leg. Repeat __________ times. Complete this exercise __________ times a day. Prone extension on elbows  Lie on your abdomen on a firm bed or the floor  (prone position). Prop yourself up on your elbows. Use your arms to help lift your chest up until you feel a gentle stretch in your abdomen and your lower back. This will place some of your body weight on your elbows. If this is uncomfortable, try stacking pillows under your chest. Your hips should stay down, against the surface that you are lying on. Keep your hip and back muscles relaxed. Hold this position for __________ seconds. Slowly relax your upper body and return to the starting position. Repeat __________ times. Complete this exercise __________ times a day. Strengthening exercises These exercises build strength and endurance in your back. Endurance is the ability to use your muscles for a long time, even after they get tired. Pelvic tilt This exercise strengthens the muscles that lie deep in the abdomen. Lie on your back on a firm bed or the floor with your legs extended. Bend your knees so they are pointing toward the ceiling and your feet are flat on the floor. Tighten your lower abdominal muscles to press your lower back against the floor. This motion will tilt your pelvis so your tailbone points up toward the ceiling instead of pointing to your feet or the floor. To help with this exercise, you may place a small towel under your lower back and try to push your back into the towel. Hold this position for __________ seconds. Let your muscles relax completely before you repeat this exercise. Repeat __________ times. Complete this exercise __________ times a day.  Alternating arm and leg raises  Get on your hands and knees on a firm surface. If you are on a hard floor, you may want to use padding, such as an exercise mat, to cushion your knees. Line up your arms and legs. Your hands should be directly below your shoulders, and your knees should be directly below your hips. Lift your left leg behind you. At the same time, raise your right arm and straighten it in front of you. Do not  lift your leg higher than your hip. Do not lift your arm higher than your shoulder. Keep your abdominal and back muscles tight. Keep your hips facing the ground. Do not arch your back. Keep your balance carefully, and do not hold your breath. Hold this position for __________ seconds. Slowly return to the starting position. Repeat with your right leg and your left arm. Repeat __________ times. Complete this exercise __________ times a day. Abdominal set with straight leg raise  Lie on your back on a firm bed or the floor. Bend one of your knees and keep your other leg straight. Tense your abdominal muscles and lift your straight leg up, 4-6 inches (10-15 cm) off the ground. Keep your abdominal muscles tight and hold this position for __________ seconds. Do not hold your breath. Do not arch your back. Keep it flat against the ground. Keep your abdominal muscles tense as you slowly lower your leg back to the starting position. Repeat with your other leg. Repeat __________ times. Complete this exercise __________ times a day. Single leg lower with bent knees Lie on your back on a firm bed or the floor. Tense your abdominal muscles and lift your feet off the floor, one foot at a time, so your knees and hips are bent in 90-degree angles (right angles). Your knees should be over your hips and your lower legs should be parallel to the floor. Keeping your abdominal muscles tense and your knee bent, slowly lower one of your legs so your toe touches the ground. Lift your leg back up to return to the starting position. Do not hold your breath. Do not let your back arch. Keep your back flat against the ground. Repeat with your other leg. Repeat __________ times. Complete this exercise __________ times a day. Posture and body mechanics Good posture and healthy body mechanics can help to relieve stress in your body's tissues and joints. Body mechanics refers to the movements and positions of your  body while you do your daily activities. Posture is part of body mechanics. Good posture means: Your spine is in its natural S-curve position (neutral). Your shoulders are pulled back slightly. Your head is not tipped forward (neutral). Follow these guidelines to improve your posture and body mechanics in your everyday activities. Standing  When standing, keep your spine neutral and your feet about hip-width apart. Keep a slight bend in your knees. Your ears, shoulders, and hips should line up. When you do a task in which you stand in one place for a long time, place one foot up on a stable object that is 2-4 inches (5-10 cm) high, such as a footstool. This helps keep your spine neutral. Sitting  When sitting, keep your spine neutral and keep your feet flat on the floor. Use a footrest, if necessary, and keep your thighs parallel to the floor. Avoid rounding your shoulders, and avoid tilting your head forward. When working at a desk or a computer, keep your desk at a height where  your hands are slightly lower than your elbows. Slide your chair under your desk so you are close enough to maintain good posture. When working at a computer, place your monitor at a height where you are looking straight ahead and you do not have to tilt your head forward or downward to look at the screen. Resting When lying down and resting, avoid positions that are most painful for you. If you have pain with activities such as sitting, bending, stooping, or squatting, lie in a position in which your body does not bend very much. For example, avoid curling up on your side with your arms and knees near your chest (fetal position). If you have pain with activities such as standing for a long time or reaching with your arms, lie with your spine in a neutral position and bend your knees slightly. Try the following positions: Lying on your side with a pillow between your knees. Lying on your back with a pillow under your  knees. Lifting  When lifting objects, keep your feet at least shoulder-width apart and tighten your abdominal muscles. Bend your knees and hips and keep your spine neutral. It is important to lift using the strength of your legs, not your back. Do not lock your knees straight out. Always ask for help to lift heavy or awkward objects. This information is not intended to replace advice given to you by your health care provider. Make sure you discuss any questions you have with your health care provider. Document Revised: 07/17/2020 Document Reviewed: 07/17/2020 Elsevier Patient Education  [place].

## 2021-04-22 NOTE — Progress Notes (Signed)
Cardiology Office Note Date:  04/24/2021  Patient ID:  Joseph Hanna, DOB 1933-04-15, MRN [id number] PCP:  [name], MD  Cardiologist:  Dr. Angelena Form (2019) Electrophysiologist: Dr. Caryl Comes > Dr. Rayann Heman    Chief Complaint:  over due annual visit  History of Present Illness: Joseph Hanna is a 87 y.o. male with history of renal cancer (s/p remote left nephrectomy), HLD, hypothyroidism, TIA, advanced heart block w/PPM, thrombocytopenia (follows with Dr. Marin Olp, felt 2/2 liver disease and Imuran tx for his UC)   He comes in today to be seen for Dr. Rayann Heman, last seen by him via tel health visit April 2020, device remote noted brief AF, <1%, longest episode at that time 22 min, without symptoms, also noted he remained at high/implant lead outputs Discussed hx of TIA, and brief Afib Consider Conger if burden increases, discussed low platelets and consider stopping ASA and revisit plt count and Eliquis if platelets improved.  He saw A. Lynnell Jude, NP Sept 2021, pt was device dependent at this visit, noted an hour long episode of Afib, no OAC yet No changes made, following with heme for his thrombocytopenia.  He saw Dr. Marin Olp last Oct 2022, platelet count was holding at 50,000, no changes were made, discussed perhaps need for back surgery, if so would need to bolster his platelets, otherwise no need for intervention.  Saw Dr. Letta Pate Dec 2022, no plans for surgery yet  TODAY He is feeling well Goes to the [facility] 3/days week does some weight training as well as cardio with good exertional capacity No CP, palpitations or cardiac awareness No SOB No dizzy spells, near syncope or syncope.  He bruises quite easily, mentions heme has discussed perhaps at some point a bone marrow biopsy though not needed/planned for yet. He has had a couple nose bleeds of late, both able to be stopped, both left sided No other bleeding or hx of bleeding   Device information Abbott dual chamber PPM implanted     Past Medical History:  Diagnosis Date   Arthritis    BPH (benign prostatic hypertrophy)    Cancer (HCC)    kidney , skin cancer    CKD (chronic kidney disease)    Clavicle fracture    Right   ED (erectile dysfunction) of organic origin    Fatty liver 2020   [place]   Gallstones 2020   [place]   GERD (gastroesophageal reflux disease)    Glaucoma    bilateral --  right uses rx drops and left eye trabeculectomy 2011   Grade I diastolic dysfunction 95/01/3266   Noted on ECHO   History of bradycardia    History of gastroesophageal reflux (GERD)    History of left bundle branch block (LBBB) 05/05/2018   Noted on EKG   History of renal cell cancer    S/P  LEFT NEPHRECTOMY  2003--  no recurrence   History of ulcerative colitis    in remission   Hyperlipidemia    Hypothyroidism    Keratosis, actinic    Lower leg pain    pt states has fibula-tibula syndrome--  goes to physical therapy   LVH (left ventricular hypertrophy) 04/21/2018   Mode, noted on ECHO   Mobitz II 05/05/2018   Noted on EKG   Presence of permanent cardiac pacemaker    Rosacea    Thrombocytopenia ([place])    TIA (transient ischemic attack) 06/20/2015   TIA (transient ischemic attack)    2017     Past Surgical  History:  Procedure Laterality Date   bilateral knee meniscus surgery      CATARACT EXTRACTION W/ INTRAOCULAR LENS  IMPLANT, BILATERAL     COLONOSCOPY  FEB 2015   GREEN LIGHT LASER TURP (TRANSURETHRAL RESECTION OF PROSTATE N/A 08/10/2013   Procedure: GREEN LIGHT LASER TURP (TRANSURETHRAL RESECTION OF PROSTATE;  Surgeon: [name], MD;  Location: [facility];  Service: Urology;  Laterality: N/A;   LUMBAR LAMINECTOMY  1990   MOHS SURGERY     x 2   NEPHRECTOMY Left 2003   ORIF CLAVICULAR FRACTURE  12/03/2011   Procedure: OPEN REDUCTION INTERNAL FIXATION (ORIF) CLAVICULAR FRACTURE;  Surgeon: [name], MD;  Location: [facility];  Service: Orthopedics;   Laterality: Right;   PACEMAKER IMPLANT N/A 05/07/2018    [employer] MRI conditional  dual-chamber pacemaker by Dr Rayann Heman for mobitz II second degree AV block   right shoulder surgery      done at [place] Left LEFT  2012   TONSILLECTOMY  as child   TRABECULECTOMY  2011   left eye (glaucoma)   TRANSURETHRAL RESECTION OF PROSTATE N/A 07/24/2018   Procedure: TRANSURETHRAL RESECTION OF THE PROSTATE (TURP);  Surgeon: Festus Aloe, MD;  Location: [facility];  Service: Urology;  Laterality: N/A;    Current Outpatient Medications  Medication Sig Dispense Refill   azaTHIOprine (IMURAN) 50 MG tablet Take 50 mg by mouth every morning.     Cholecalciferol (VITAMIN D3) 1000 UNITS CAPS Take 2 capsules by mouth daily.      Coenzyme Q10 (COQ10) 200 MG CAPS Take 1 capsule by mouth daily.     dorzolamide-timolol (COSOPT) 22.3-6.8 MG/ML ophthalmic solution Place 1 drop into the right eye 3 (three) times daily.     finasteride (PROSCAR) 5 MG tablet Take 5 mg by mouth daily.     fluoruracil (CARAC) 0.5 % cream Apply 1 application topically as needed. ONLY TAKES TWICE A YEAR     Ginkgo Biloba 40 MG TABS Take 120 mg by mouth daily.      levothyroxine (SYNTHROID, LEVOTHROID) 50 MCG tablet Take 50-100 mcg by mouth daily before breakfast. Take 12mg T, Th, S,and S.  Takes 100 mcg M,W,and F.     mesalamine (APRISO) 0.375 G 24 hr capsule Take 2,250 mg by mouth daily. TAKES 6 TABS DAILY--  TOTAL 2250MG     mesalamine (CANASA) 1000 MG suppository Place 1,000 mg rectally at bedtime.     Multiple Vitamins-Minerals (EQ COMPLETE MULTIVIT ADULT 50+ PO) Take 1 tablet by mouth daily.     Netarsudil-Latanoprost 0.02-0.005 % SOLN Apply 1 drop to eye at bedtime. Right eye.     niacin (SLO-NIACIN) 500 MG tablet Take 500 mg by mouth at bedtime.     RESTASIS 0.05 % ophthalmic emulsion 1 drop 2 (two) times daily.     simvastatin (ZOCOR) 20 MG tablet Take 10 mg by mouth daily. TAKES QHS     urea  (CARMOL) 20 % cream Apply topically as needed.     No current facility-administered medications for this visit.    Allergies:   Itraconazole, Other, Atorvastatin, and Terbinafine   Social History:  The patient  reports that he quit smoking about 67 years ago. His smoking use included cigarettes. He has never used smokeless tobacco. He reports current alcohol use of about 7.0 standard drinks per week. He reports that he does not use drugs.   Family History:  The  patient's family history includes Alzheimer's disease in his brother; Breast cancer in his mother; Emphysema in his father.  ROS:  Please see the history of present illness.    All other systems are reviewed and otherwise negative.   PHYSICAL EXAM:  VS:  BP (!) 148/66   Pulse 73   Ht 6' 2"  (1.88 m)   Wt 163 lb (73.9 kg)   BMI 20.93 kg/m  BMI: Body mass index is 20.93 kg/m. Well nourished, well developed, in no acute distress HEENT: normocephalic, atraumatic Neck: no JVD, carotid bruits or masses Cardiac:   RRR; no significant murmurs, no rubs, or gallops Lungs:   CTA b/l, no wheezing, rhonchi or rales Abd: soft, nontender MS: no deformity age appropriate atrophy Ext: no edema Skin: warm and dry, no rash Neuro:  No gross deficits appreciated Psych: euthymic mood, full affect  PPM site is stable, no tethering or discomfort   EKG:  Done today and reviewed by myself shows  SR, PACs, V paced 73bpm  Device interrogation done today and reviewed by myself:  Battery and lead measurements re good AF burden <1% Longest episode was < 1 hour in duration   04/21/2018: TTE Study Conclusions  - Left ventricle: The cavity size was normal. Wall thickness was    increased in a pattern of moderate LVH. Systolic function was    normal. The estimated ejection fraction was in the range of 60%    to 65%. Wall motion was normal; there were no regional wall    motion abnormalities. Doppler parameters are consistent with    abnormal  left ventricular relaxation (grade 1 diastolic    dysfunction). The E/e&' ratio is between 8-15, suggesting    indeterminate LV filling pressure.  - Mitral valve: Mildly thickened leaflets . There was mild    regurgitation.  - Left atrium: The atrium was normal in size.  - Inferior vena cava: The vessel was normal in size. The    respirophasic diameter changes were in the normal range (>= 50%),    consistent with normal central venous pressure.   Impressions:  - LVEF 60-65%, moderate LVH, normal wall motion, grade 1 DD,    indeterminate LV filling pressure, mild MR, normal LA size,    normal IVC.    Recent Labs: 02/19/2021: ALT 13; BUN 24; Creatinine 1.25; Hemoglobin 13.3; Platelet Count 50; Potassium 4.8; Sodium 140  No results found for requested labs within last 8760 hours.   CrCl cannot be calculated (Patient's most recent lab result is older than the maximum 21 days allowed.).   Wt Readings from Last 3 Encounters:  04/24/21 163 lb (73.9 kg)  04/17/21 164 lb (74.4 kg)  03/06/21 165 lb 12.8 oz (75.2 kg)     Other studies reviewed: Additional studies/records reviewed today include: summarized above  ASSESSMENT AND PLAN:  PPM Intact function, no programming changes made He is dependent  Paroxysmal AFib CHA2DS2Vasc is 4 <1 % burden  I discussed this findings at length with the patient We discussed AFib and his stroke risk with hx of TIA Discussed low burden and concerns with a/c given his thrombocytopenia and rational for burden monitoring He is comfortable with the current strategy  Thrombocytopenia Follows with heme   Disposition: F/u with remotes as usual, in clinic in 57mo he is aware Dr. ARayann Hemanis retiring, will plan to have him established with Dr. CCurt Bears Current medicines are reviewed at length with the patient today.  The patient did not have  any concerns regarding medicines.  Venetia Night, PA-C 04/24/2021 9:37 AM     [place]  [address] [place] [address] [phone] (office)  [phone] (fax)

## 2021-04-24 ENCOUNTER — Ambulatory Visit (INDEPENDENT_AMBULATORY_CARE_PROVIDER_SITE_OTHER): Payer: Medicare HMO | Admitting: Physician Assistant

## 2021-04-24 ENCOUNTER — Other Ambulatory Visit: Payer: Self-pay

## 2021-04-24 ENCOUNTER — Encounter: Payer: Self-pay | Admitting: Physician Assistant

## 2021-04-24 VITALS — BP 148/66 | HR 73 | Ht 74.0 in | Wt 163.0 lb

## 2021-04-24 DIAGNOSIS — I480 Paroxysmal atrial fibrillation: Secondary | ICD-10-CM | POA: Diagnosis not present

## 2021-04-24 DIAGNOSIS — I441 Atrioventricular block, second degree: Secondary | ICD-10-CM | POA: Diagnosis not present

## 2021-04-24 DIAGNOSIS — Z950 Presence of cardiac pacemaker: Secondary | ICD-10-CM

## 2021-04-24 LAB — CUP PACEART INCLINIC DEVICE CHECK
Battery Remaining Longevity: 61 mo
Battery Voltage: 2.99 V
Brady Statistic RA Percent Paced: 70 %
Brady Statistic RV Percent Paced: 99.21 %
Date Time Interrogation Session: 20221213095149
Implantable Lead Implant Date: 20191226
Implantable Lead Implant Date: 20191226
Implantable Lead Location: 753859
Implantable Lead Location: 753860
Implantable Pulse Generator Implant Date: 20191226
Lead Channel Impedance Value: 425 Ohm
Lead Channel Impedance Value: 512.5 Ohm
Lead Channel Pacing Threshold Amplitude: 0.5 V
Lead Channel Pacing Threshold Amplitude: 0.5 V
Lead Channel Pacing Threshold Amplitude: 0.5 V
Lead Channel Pacing Threshold Amplitude: 0.5 V
Lead Channel Pacing Threshold Pulse Width: 0.3 ms
Lead Channel Pacing Threshold Pulse Width: 0.3 ms
Lead Channel Pacing Threshold Pulse Width: 0.5 ms
Lead Channel Pacing Threshold Pulse Width: 0.5 ms
Lead Channel Sensing Intrinsic Amplitude: 10.8 mV
Lead Channel Sensing Intrinsic Amplitude: 3.3 mV
Lead Channel Setting Pacing Amplitude: 2 V
Lead Channel Setting Pacing Amplitude: 2.5 V
Lead Channel Setting Pacing Pulse Width: 0.5 ms
Lead Channel Setting Sensing Sensitivity: 2 mV
Pulse Gen Model: 2272
Pulse Gen Serial Number: 9094310

## 2021-04-24 NOTE — Patient Instructions (Signed)
Medication Instructions:   Your physician recommends that you continue on your current medications as directed. Please refer to the Current Medication list given to you today.   *If you need a refill on your cardiac medications before your next appointment, please call your pharmacy*   Lab Work: [place]   If you have labs (blood work) drawn today and your tests are completely normal, you will receive your results only by: [place] (if you have MyChart) OR A paper copy in the mail If you have any lab test that is abnormal or we need to change your treatment, we will call you to review the results.   Testing/Procedures: NONE ORDERED  TODAY    Follow-Up: At [facility], you and your health needs are our priority.  As part of our continuing mission to provide you with exceptional heart care, we have created designated Provider Care Teams.  These Care Teams include your primary Cardiologist (physician) and Advanced Practice Providers (APPs -  Physician Assistants and Nurse Practitioners) who all work together to provide you with the care you need, when you need it.  We recommend signing up for the patient portal called "MyChart".  Sign up information is provided on this After Visit Summary.  MyChart is used to connect with patients for Virtual Visits (Telemedicine).  Patients are able to view lab/test results, encounter notes, upcoming appointments, etc.  Non-urgent messages can be sent to your provider as well.   To learn more about what you can do with MyChart, go to [email or web address].    Your next appointment:   6 month(s)  The format for your next appointment:   In Person  Provider:   Allegra Lai, MD    Other Instructions

## 2021-05-08 ENCOUNTER — Ambulatory Visit (INDEPENDENT_AMBULATORY_CARE_PROVIDER_SITE_OTHER): Payer: Medicare HMO

## 2021-05-08 DIAGNOSIS — I441 Atrioventricular block, second degree: Secondary | ICD-10-CM | POA: Diagnosis not present

## 2021-05-09 LAB — CUP PACEART REMOTE DEVICE CHECK
Battery Remaining Longevity: 63 mo
Battery Remaining Percentage: 64 %
Battery Voltage: 2.99 V
Brady Statistic AP VP Percent: 71 %
Brady Statistic AP VS Percent: 1 %
Brady Statistic AS VP Percent: 28 %
Brady Statistic AS VS Percent: 1 %
Brady Statistic RA Percent Paced: 70 %
Brady Statistic RV Percent Paced: 99 %
Date Time Interrogation Session: 20221227234912
Implantable Lead Implant Date: 20191226
Implantable Lead Implant Date: 20191226
Implantable Lead Location: 753859
Implantable Lead Location: 753860
Implantable Pulse Generator Implant Date: 20191226
Lead Channel Impedance Value: 400 Ohm
Lead Channel Impedance Value: 510 Ohm
Lead Channel Pacing Threshold Amplitude: 0.5 V
Lead Channel Pacing Threshold Amplitude: 0.5 V
Lead Channel Pacing Threshold Pulse Width: 0.3 ms
Lead Channel Pacing Threshold Pulse Width: 0.5 ms
Lead Channel Sensing Intrinsic Amplitude: 10.8 mV
Lead Channel Sensing Intrinsic Amplitude: 3 mV
Lead Channel Setting Pacing Amplitude: 2 V
Lead Channel Setting Pacing Amplitude: 2.5 V
Lead Channel Setting Pacing Pulse Width: 0.5 ms
Lead Channel Setting Sensing Sensitivity: 2 mV
Pulse Gen Model: 2272
Pulse Gen Serial Number: 9094310

## 2021-05-11 DIAGNOSIS — N183 Chronic kidney disease, stage 3 unspecified: Secondary | ICD-10-CM | POA: Diagnosis not present

## 2021-05-11 DIAGNOSIS — E039 Hypothyroidism, unspecified: Secondary | ICD-10-CM | POA: Diagnosis not present

## 2021-05-11 DIAGNOSIS — E785 Hyperlipidemia, unspecified: Secondary | ICD-10-CM | POA: Diagnosis not present

## 2021-05-17 NOTE — Progress Notes (Signed)
Remote pacemaker transmission.   

## 2021-05-22 ENCOUNTER — Other Ambulatory Visit: Payer: Self-pay

## 2021-05-22 ENCOUNTER — Inpatient Hospital Stay: Payer: Medicare HMO

## 2021-05-22 ENCOUNTER — Encounter: Payer: Self-pay | Admitting: Hematology & Oncology

## 2021-05-22 ENCOUNTER — Inpatient Hospital Stay: Payer: Medicare HMO | Attending: Hematology & Oncology | Admitting: Hematology & Oncology

## 2021-05-22 VITALS — BP 156/76 | HR 66 | Temp 97.8°F | Resp 16 | Wt 163.0 lb

## 2021-05-22 DIAGNOSIS — D696 Thrombocytopenia, unspecified: Secondary | ICD-10-CM | POA: Insufficient documentation

## 2021-05-22 DIAGNOSIS — Z8719 Personal history of other diseases of the digestive system: Secondary | ICD-10-CM

## 2021-05-22 LAB — CBC WITH DIFFERENTIAL (CANCER CENTER ONLY)
Abs Immature Granulocytes: 0.04 10*3/uL (ref 0.00–0.07)
Basophils Absolute: 0 10*3/uL (ref 0.0–0.1)
Basophils Relative: 0 %
Eosinophils Absolute: 0 10*3/uL (ref 0.0–0.5)
Eosinophils Relative: 0 %
HCT: 43.6 % (ref 39.0–52.0)
Hemoglobin: 13.8 g/dL (ref 13.0–17.0)
Immature Granulocytes: 1 %
Lymphocytes Relative: 16 %
Lymphs Abs: 1.2 10*3/uL (ref 0.7–4.0)
MCH: 30.9 pg (ref 26.0–34.0)
MCHC: 31.7 g/dL (ref 30.0–36.0)
MCV: 97.5 fL (ref 80.0–100.0)
Monocytes Absolute: 1.9 10*3/uL — ABNORMAL HIGH (ref 0.1–1.0)
Monocytes Relative: 25 %
Neutro Abs: 4.4 10*3/uL (ref 1.7–7.7)
Neutrophils Relative %: 58 %
Platelet Count: 54 10*3/uL — ABNORMAL LOW (ref 150–400)
RBC: 4.47 MIL/uL (ref 4.22–5.81)
RDW: 14 % (ref 11.5–15.5)
WBC Count: 7.6 10*3/uL (ref 4.0–10.5)
nRBC: 0 % (ref 0.0–0.2)

## 2021-05-22 LAB — CMP (CANCER CENTER ONLY)
ALT: 12 U/L (ref 0–44)
AST: 14 U/L — ABNORMAL LOW (ref 15–41)
Albumin: 4.9 g/dL (ref 3.5–5.0)
Alkaline Phosphatase: 40 U/L (ref 38–126)
Anion gap: 6 (ref 5–15)
BUN: 22 mg/dL (ref 8–23)
CO2: 32 mmol/L (ref 22–32)
Calcium: 10.6 mg/dL — ABNORMAL HIGH (ref 8.9–10.3)
Chloride: 103 mmol/L (ref 98–111)
Creatinine: 1.38 mg/dL — ABNORMAL HIGH (ref 0.61–1.24)
GFR, Estimated: 50 mL/min — ABNORMAL LOW (ref >60)
Glucose, Bld: 80 mg/dL (ref 70–99)
Potassium: 4.6 mmol/L (ref 3.5–5.1)
Sodium: 141 mmol/L (ref 135–145)
Total Bilirubin: 0.8 mg/dL (ref 0.3–1.2)
Total Protein: 7.1 g/dL (ref 6.5–8.1)

## 2021-05-22 LAB — LACTATE DEHYDROGENASE: LDH: 158 U/L (ref 98–192)

## 2021-05-22 NOTE — Progress Notes (Signed)
Hematology and Oncology Follow Up Visit  Joseph Hanna [id number] 07/24/1932 88 y.o. 05/22/2021   Principle Diagnosis:  Thrombocytopenia secondary to underlying liver disease and Imuran (for UC)  Current Therapy:   Observation   Interim History:  Joseph Hanna is here today for follow-up.  Unfortunately, he has had more back problems.  He is getting spinal injections.  Hopefully this will help him.  Otherwise he has been doing pretty well.  We last saw him back in October.  He had no problems over the holiday season.  He has had no problems with nausea or vomiting.  There is been no change in bowel or bladder habits.  He has had no rashes..  He does have a pacemaker in.  There is been no cough or shortness of breath.  He has had no leg swelling.  Overall, I would say his performance status is probably ECOG 1.     Medications:  Allergies as of 05/22/2021       Reactions   Itraconazole Rash   Other reaction(s): rash   Other Rash   Atorvastatin Other (See Comments)   "severe muscle aches" Other reaction(s): muscle aches   Terbinafine Rash        Medication List        Accurate as of May 22, 2021 11:18 AM. If you have any questions, ask your nurse or doctor.          azaTHIOprine 50 MG tablet Commonly known as: IMURAN Take 50 mg by mouth every morning.   CoQ10 200 MG Caps Take 1 capsule by mouth daily.   dorzolamide-timolol 22.3-6.8 MG/ML ophthalmic solution Commonly known as: COSOPT Place 1 drop into the right eye 3 (three) times daily.   EQ COMPLETE MULTIVIT ADULT 50+ PO Take 1 tablet by mouth daily.   finasteride 5 MG tablet Commonly known as: PROSCAR Take 5 mg by mouth daily.   fluoruracil 0.5 % cream Commonly known as: CARAC Apply 1 application topically as needed. ONLY TAKES TWICE A YEAR   Ginkgo Biloba 40 MG Tabs Take 120 mg by mouth daily.   levothyroxine 50 MCG tablet Commonly known as: SYNTHROID Take 50-100 mcg by mouth daily before  breakfast. Take 36mg T, Th, S,and S.  Takes 100 mcg M,W,and F.   mesalamine 0.375 g 24 hr capsule Commonly known as: APRISO Take 2,250 mg by mouth daily. TAKES 6 TABS DAILY--  TOTAL 2250MG   mesalamine 1000 MG suppository Commonly known as: CANASA Place 1,000 mg rectally at bedtime.   Netarsudil-Latanoprost 0.02-0.005 % Soln Apply 1 drop to eye at bedtime. Right eye.   niacin 500 MG tablet Commonly known as: SLO-NIACIN Take 500 mg by mouth at bedtime.   Restasis 0.05 % ophthalmic emulsion Generic drug: cycloSPORINE 1 drop 2 (two) times daily.   simvastatin 20 MG tablet Commonly known as: ZOCOR Take 10 mg by mouth daily. TAKES QHS   urea 20 % cream Commonly known as: CARMOL Apply topically as needed.   Vitamin D3 25 MCG (1000 UT) Caps Take 2 capsules by mouth daily.        Allergies:  Allergies  Allergen Reactions   Itraconazole Rash    Other reaction(s): rash   Other Rash   Atorvastatin Other (See Comments)    "severe muscle aches" Other reaction(s): muscle aches   Terbinafine Rash    Past Medical History, Surgical history, Social history, and Family History were reviewed and updated.  Review of Systems: Review of Systems  Constitutional: Negative.   HENT: Negative.    Eyes: Negative.   Respiratory: Negative.    Cardiovascular: Negative.   Gastrointestinal: Negative.   Genitourinary: Negative.   Musculoskeletal: Negative.   Skin: Negative.   Neurological: Negative.   Endo/Heme/Allergies:  Bruises/bleeds easily.  Psychiatric/Behavioral: Negative.     Physical Exam:  weight is 163 lb (73.9 kg). His oral temperature is 97.8 F (36.6 C). His blood pressure is 156/76 (abnormal) and his pulse is 66. His respiration is 16 and oxygen saturation is 100%.   Wt Readings from Last 3 Encounters:  05/22/21 163 lb (73.9 kg)  04/24/21 163 lb (73.9 kg)  04/17/21 164 lb (74.4 kg)    Physical Exam Vitals reviewed.  HENT:     Head: Normocephalic and  atraumatic.  Eyes:     Pupils: Pupils are equal, round, and reactive to light.  Cardiovascular:     Rate and Rhythm: Normal rate and regular rhythm.     Heart sounds: Normal heart sounds.  Pulmonary:     Effort: Pulmonary effort is normal.     Breath sounds: Normal breath sounds.  Abdominal:     General: Bowel sounds are normal.     Palpations: Abdomen is soft.  Musculoskeletal:        General: No tenderness or deformity. Normal range of motion.     Cervical back: Normal range of motion.  Lymphadenopathy:     Cervical: No cervical adenopathy.  Skin:    General: Skin is warm and dry.     Findings: No erythema or rash.  Neurological:     Mental Status: He is alert and oriented to person, place, and time.  Psychiatric:        Behavior: Behavior normal.        Thought Content: Thought content normal.        Judgment: Judgment normal.     Lab Results  Component Value Date   WBC 7.6 05/22/2021   HGB 13.8 05/22/2021   HCT 43.6 05/22/2021   MCV 97.5 05/22/2021   PLT 54 (L) 05/22/2021   Lab Results  Component Value Date   FERRITIN 70 05/25/2019   IRON 96 05/25/2019   TIBC 308 05/25/2019   UIBC 212 05/25/2019   IRONPCTSAT 31 05/25/2019   Lab Results  Component Value Date   RBC 4.47 05/22/2021   No results found for: KPAFRELGTCHN, LAMBDASER, KAPLAMBRATIO No results found for: IGGSERUM, IGA, IGMSERUM No results found for: Odetta Pink, SPEI   Chemistry      Component Value Date/Time   NA 141 05/22/2021 1009   K 4.6 05/22/2021 1009   CL 103 05/22/2021 1009   CO2 32 05/22/2021 1009   BUN 22 05/22/2021 1009   CREATININE 1.38 (H) 05/22/2021 1009      Component Value Date/Time   CALCIUM 10.6 (H) 05/22/2021 1009   ALKPHOS 40 05/22/2021 1009   AST 14 (L) 05/22/2021 1009   ALT 12 05/22/2021 1009   BILITOT 0.8 05/22/2021 1009       Impression and Plan: Joseph Hanna is a very pleasant 88 yo caucasian gentleman with  history of thrombocytopenia since at least February 2017.   Of note, he served in the [employer].  As such, he is a true American hero.  He does get some of his care at the [facility] hospital in [place].  Overall, his platelet count is holding steady.  I looked at his blood smear under the microscope.  I do not see anything that looks suspicious.  He has several large platelets that are well granulated.  We will still plan to get him back in the Spring.  He ever needed to have back surgery, I would not see a problem with this.  We can always give him Nplate to try to get his platelet count up.    Volanda Napoleon, MD 1/10/202311:18 AM

## 2021-06-19 ENCOUNTER — Ambulatory Visit: Payer: No Typology Code available for payment source | Admitting: Physical Medicine & Rehabilitation

## 2021-06-21 ENCOUNTER — Encounter: Payer: Self-pay | Admitting: Physical Medicine & Rehabilitation

## 2021-06-21 ENCOUNTER — Encounter
Payer: No Typology Code available for payment source | Attending: Physical Medicine & Rehabilitation | Admitting: Physical Medicine & Rehabilitation

## 2021-06-21 ENCOUNTER — Other Ambulatory Visit: Payer: Self-pay

## 2021-06-21 VITALS — BP 163/78 | HR 63 | Temp 98.4°F | Ht 74.0 in | Wt 165.0 lb

## 2021-06-21 DIAGNOSIS — M5416 Radiculopathy, lumbar region: Secondary | ICD-10-CM | POA: Diagnosis present

## 2021-06-21 DIAGNOSIS — M48061 Spinal stenosis, lumbar region without neurogenic claudication: Secondary | ICD-10-CM

## 2021-06-21 NOTE — Progress Notes (Signed)
Left L4-5 Lumbar transforaminal epidural steroid injection under fluoroscopic guidance with contrast enhancement  Indication: Lumbosacral radiculitis is not relieved by medication management or other conservative care and interfering with self-care and mobility.   Informed consent was obtained after describing risk and benefits of the procedure with the patient, this includes bleeding, bruising, infection, paralysis and medication side effects.  The patient wishes to proceed and has given written consent.  Patient was placed in prone position.  The lumbar area was marked and prepped with Betadine.  It was entered with a 25-gauge 1-1/2 inch needle and one mL of 1% lidocaine was injected into the skin and subcutaneous tissue.  Then a 22-gauge 3.5 in spinal needle was inserted into the Left L4-5 intervertebral foramen under AP, lateral, and oblique view.  Once needle tip was within the foramen on lateral views an dnor exceeding 6 o clock position on th epedical on AP viewed Isovue 200 was inected x 72m Then a solution containing one mL of 10 mg per mL dexamethasone and 2 mL of 1% lidocaine was injected.  The patient tolerated procedure well.  Post procedure instructions were given.  Please see post procedure form.

## 2021-06-21 NOTE — Patient Instructions (Signed)

## 2021-06-21 NOTE — Progress Notes (Signed)
°  PROCEDURE RECORD [facility]   Name: Joseph Hanna DOB:1932-10-29 MRN: [id number]  Date:06/21/2021  Physician: Alysia Penna, MD    Nurse/CMA: Ronnald Ramp, RMA   Allergies:  Allergies  Allergen Reactions   Itraconazole Rash    Other reaction(s): rash   Other Rash   Atorvastatin Other (See Comments)    "severe muscle aches" Other reaction(s): muscle aches   Terbinafine Rash    Consent Signed: Yes.    Is patient diabetic? No.  CBG today? .  Pregnant: No. LMP: No LMP for male patient. (age 49-55)  Anticoagulants: no Anti-inflammatory: no Antibiotics: no  Procedure: Left Lumbar 4-5 Transforaminal Epidural Steroid Injection  Position: Prone Start Time:  10:03 am  End Time: 10:09 am  Fluoro Time: 37  RN/CMA Kirin Brandenburger, RMA Gaytha Raybourn, RMA    Time 9:30 am 10:20 am    BP 165/68 163/78    Pulse 60 63    Respirations 16 18    O2 Sat 98 98    S/S 6 6    Pain Level 4/10 0/10     D/C home with wife, patient A & O X 3, D/C instructions reviewed, and sits independently.

## 2021-07-03 DIAGNOSIS — H35372 Puckering of macula, left eye: Secondary | ICD-10-CM | POA: Diagnosis not present

## 2021-07-03 DIAGNOSIS — H35033 Hypertensive retinopathy, bilateral: Secondary | ICD-10-CM | POA: Diagnosis not present

## 2021-07-03 DIAGNOSIS — H16223 Keratoconjunctivitis sicca, not specified as Sjogren's, bilateral: Secondary | ICD-10-CM | POA: Diagnosis not present

## 2021-07-03 DIAGNOSIS — H02401 Unspecified ptosis of right eyelid: Secondary | ICD-10-CM | POA: Diagnosis not present

## 2021-07-03 DIAGNOSIS — H401133 Primary open-angle glaucoma, bilateral, severe stage: Secondary | ICD-10-CM | POA: Diagnosis not present

## 2021-07-20 DIAGNOSIS — R030 Elevated blood-pressure reading, without diagnosis of hypertension: Secondary | ICD-10-CM | POA: Diagnosis not present

## 2021-07-20 DIAGNOSIS — E039 Hypothyroidism, unspecified: Secondary | ICD-10-CM | POA: Diagnosis not present

## 2021-07-20 DIAGNOSIS — M792 Neuralgia and neuritis, unspecified: Secondary | ICD-10-CM | POA: Diagnosis not present

## 2021-07-20 DIAGNOSIS — N400 Enlarged prostate without lower urinary tract symptoms: Secondary | ICD-10-CM | POA: Diagnosis not present

## 2021-07-20 DIAGNOSIS — E785 Hyperlipidemia, unspecified: Secondary | ICD-10-CM | POA: Diagnosis not present

## 2021-07-20 DIAGNOSIS — G8929 Other chronic pain: Secondary | ICD-10-CM | POA: Diagnosis not present

## 2021-07-20 DIAGNOSIS — K5190 Ulcerative colitis, unspecified, without complications: Secondary | ICD-10-CM | POA: Diagnosis not present

## 2021-07-20 DIAGNOSIS — H409 Unspecified glaucoma: Secondary | ICD-10-CM | POA: Diagnosis not present

## 2021-07-20 DIAGNOSIS — L309 Dermatitis, unspecified: Secondary | ICD-10-CM | POA: Diagnosis not present

## 2021-07-20 DIAGNOSIS — R32 Unspecified urinary incontinence: Secondary | ICD-10-CM | POA: Diagnosis not present

## 2021-07-20 DIAGNOSIS — N529 Male erectile dysfunction, unspecified: Secondary | ICD-10-CM | POA: Diagnosis not present

## 2021-07-20 DIAGNOSIS — H547 Unspecified visual loss: Secondary | ICD-10-CM | POA: Diagnosis not present

## 2021-07-31 DIAGNOSIS — L570 Actinic keratosis: Secondary | ICD-10-CM | POA: Diagnosis not present

## 2021-07-31 DIAGNOSIS — Z85828 Personal history of other malignant neoplasm of skin: Secondary | ICD-10-CM | POA: Diagnosis not present

## 2021-07-31 DIAGNOSIS — D1801 Hemangioma of skin and subcutaneous tissue: Secondary | ICD-10-CM | POA: Diagnosis not present

## 2021-07-31 DIAGNOSIS — L812 Freckles: Secondary | ICD-10-CM | POA: Diagnosis not present

## 2021-07-31 DIAGNOSIS — L821 Other seborrheic keratosis: Secondary | ICD-10-CM | POA: Diagnosis not present

## 2021-07-31 DIAGNOSIS — L720 Epidermal cyst: Secondary | ICD-10-CM | POA: Diagnosis not present

## 2021-08-06 ENCOUNTER — Ambulatory Visit (INDEPENDENT_AMBULATORY_CARE_PROVIDER_SITE_OTHER): Payer: Medicare HMO

## 2021-08-06 DIAGNOSIS — I441 Atrioventricular block, second degree: Secondary | ICD-10-CM | POA: Diagnosis not present

## 2021-08-07 LAB — CUP PACEART REMOTE DEVICE CHECK
Battery Remaining Longevity: 61 mo
Battery Remaining Percentage: 61 %
Battery Voltage: 2.99 V
Brady Statistic AP VP Percent: 71 %
Brady Statistic AP VS Percent: 1 %
Brady Statistic AS VP Percent: 28 %
Brady Statistic AS VS Percent: 1 %
Brady Statistic RA Percent Paced: 69 %
Brady Statistic RV Percent Paced: 99 %
Date Time Interrogation Session: 20230327020015
Implantable Lead Implant Date: 20191226
Implantable Lead Implant Date: 20191226
Implantable Lead Location: 753859
Implantable Lead Location: 753860
Implantable Pulse Generator Implant Date: 20191226
Lead Channel Impedance Value: 410 Ohm
Lead Channel Impedance Value: 510 Ohm
Lead Channel Pacing Threshold Amplitude: 0.5 V
Lead Channel Pacing Threshold Amplitude: 0.5 V
Lead Channel Pacing Threshold Pulse Width: 0.3 ms
Lead Channel Pacing Threshold Pulse Width: 0.5 ms
Lead Channel Sensing Intrinsic Amplitude: 10.3 mV
Lead Channel Sensing Intrinsic Amplitude: 3.7 mV
Lead Channel Setting Pacing Amplitude: 2 V
Lead Channel Setting Pacing Amplitude: 2.5 V
Lead Channel Setting Pacing Pulse Width: 0.5 ms
Lead Channel Setting Sensing Sensitivity: 2 mV
Pulse Gen Model: 2272
Pulse Gen Serial Number: 9094310

## 2021-08-16 DIAGNOSIS — R7301 Impaired fasting glucose: Secondary | ICD-10-CM | POA: Diagnosis not present

## 2021-08-16 DIAGNOSIS — Z79899 Other long term (current) drug therapy: Secondary | ICD-10-CM | POA: Diagnosis not present

## 2021-08-16 DIAGNOSIS — Z125 Encounter for screening for malignant neoplasm of prostate: Secondary | ICD-10-CM | POA: Diagnosis not present

## 2021-08-16 DIAGNOSIS — E785 Hyperlipidemia, unspecified: Secondary | ICD-10-CM | POA: Diagnosis not present

## 2021-08-16 DIAGNOSIS — R7989 Other specified abnormal findings of blood chemistry: Secondary | ICD-10-CM | POA: Diagnosis not present

## 2021-08-16 DIAGNOSIS — E039 Hypothyroidism, unspecified: Secondary | ICD-10-CM | POA: Diagnosis not present

## 2021-08-16 NOTE — Progress Notes (Signed)
Remote pacemaker transmission.   

## 2021-08-23 DIAGNOSIS — I739 Peripheral vascular disease, unspecified: Secondary | ICD-10-CM | POA: Diagnosis not present

## 2021-08-23 DIAGNOSIS — M47816 Spondylosis without myelopathy or radiculopathy, lumbar region: Secondary | ICD-10-CM | POA: Diagnosis not present

## 2021-08-23 DIAGNOSIS — M1990 Unspecified osteoarthritis, unspecified site: Secondary | ICD-10-CM | POA: Diagnosis not present

## 2021-08-23 DIAGNOSIS — Q600 Renal agenesis, unilateral: Secondary | ICD-10-CM | POA: Diagnosis not present

## 2021-08-23 DIAGNOSIS — Z950 Presence of cardiac pacemaker: Secondary | ICD-10-CM | POA: Diagnosis not present

## 2021-08-23 DIAGNOSIS — R001 Bradycardia, unspecified: Secondary | ICD-10-CM | POA: Diagnosis not present

## 2021-08-23 DIAGNOSIS — D692 Other nonthrombocytopenic purpura: Secondary | ICD-10-CM | POA: Diagnosis not present

## 2021-08-23 DIAGNOSIS — G459 Transient cerebral ischemic attack, unspecified: Secondary | ICD-10-CM | POA: Diagnosis not present

## 2021-08-23 DIAGNOSIS — R7301 Impaired fasting glucose: Secondary | ICD-10-CM | POA: Diagnosis not present

## 2021-08-23 DIAGNOSIS — Z23 Encounter for immunization: Secondary | ICD-10-CM | POA: Diagnosis not present

## 2021-08-23 DIAGNOSIS — Z79899 Other long term (current) drug therapy: Secondary | ICD-10-CM | POA: Diagnosis not present

## 2021-08-23 DIAGNOSIS — R82998 Other abnormal findings in urine: Secondary | ICD-10-CM | POA: Diagnosis not present

## 2021-08-23 DIAGNOSIS — D849 Immunodeficiency, unspecified: Secondary | ICD-10-CM | POA: Diagnosis not present

## 2021-08-23 DIAGNOSIS — Z0000 Encounter for general adult medical examination without abnormal findings: Secondary | ICD-10-CM | POA: Diagnosis not present

## 2021-09-11 ENCOUNTER — Encounter
Payer: No Typology Code available for payment source | Attending: Physical Medicine & Rehabilitation | Admitting: Physical Medicine & Rehabilitation

## 2021-09-11 ENCOUNTER — Encounter: Payer: Self-pay | Admitting: Physical Medicine & Rehabilitation

## 2021-09-11 VITALS — BP 152/74 | HR 61 | Ht 74.0 in | Wt 166.0 lb

## 2021-09-11 DIAGNOSIS — M5416 Radiculopathy, lumbar region: Secondary | ICD-10-CM | POA: Diagnosis not present

## 2021-09-11 DIAGNOSIS — M48061 Spinal stenosis, lumbar region without neurogenic claudication: Secondary | ICD-10-CM | POA: Diagnosis not present

## 2021-09-11 NOTE — Patient Instructions (Signed)
OK for Tylenol 2 tablets per day occasionally 3 tablet ?Back Exercises ?These exercises help to make your trunk and back strong. They also help to keep the lower back flexible. Doing these exercises can help to prevent or lessen pain in your lower back. ?If you have back pain, try to do these exercises 2-3 times each day or as told by your doctor. ?As you get better, do the exercises once each day. Repeat the exercises more often as told by your doctor. ?To stop back pain from coming back, do the exercises once each day, or as told by your doctor. ?Do exercises exactly as told by your doctor. Stop right away if you feel sudden pain or your pain gets worse. ?Exercises ?Single knee to chest ?Do these steps 3-5 times in a row for each leg: ?Lie on your back on a firm bed or the floor with your legs stretched out. ?Bring one knee to your chest. ?Grab your knee or thigh with both hands and hold it in place. ?Pull on your knee until you feel a gentle stretch in your lower back or butt. ?Keep doing the stretch for 10-30 seconds. ?Slowly let go of your leg and straighten it. ?Pelvic tilt ?Do these steps 5-10 times in a row: ?Lie on your back on a firm bed or the floor with your legs stretched out. ?Bend your knees so they point up to the ceiling. Your feet should be flat on the floor. ?Tighten your lower belly (abdomen) muscles to press your lower back against the floor. This will make your tailbone point up to the ceiling instead of pointing down to your feet or the floor. ?Stay in this position for 5-10 seconds while you gently tighten your muscles and breathe evenly. ?Cat-cow ?Do these steps until your lower back bends more easily: ?Get on your hands and knees on a firm bed or the floor. Keep your hands under your shoulders, and keep your knees under your hips. You may put padding under your knees. ?Let your head hang down toward your chest. Tighten (contract) the muscles in your belly. Point your tailbone toward the  floor so your lower back becomes rounded like the back of a cat. ?Stay in this position for 5 seconds. ?Slowly lift your head. Let the muscles of your belly relax. Point your tailbone up toward the ceiling so your back forms a sagging arch like the back of a cow. ?Stay in this position for 5 seconds. ? ?Press-ups ?Do these steps 5-10 times in a row: ?Lie on your belly (face-down) on a firm bed or the floor. ?Place your hands near your head, about shoulder-width apart. ?While you keep your back relaxed and keep your hips on the floor, slowly straighten your arms to raise the top half of your body and lift your shoulders. Do not use your back muscles. You may change where you place your hands to make yourself more comfortable. ?Stay in this position for 5 seconds. Keep your back relaxed. ?Slowly return to lying flat on the floor. ? ?Bridges ?Do these steps 10 times in a row: ?Lie on your back on a firm bed or the floor. ?Bend your knees so they point up to the ceiling. Your feet should be flat on the floor. Your arms should be flat at your sides, next to your body. ?Tighten your butt muscles and lift your butt off the floor until your waist is almost as high as your knees. If you do not feel  the muscles working in your butt and the back of your thighs, slide your feet 1-2 inches (2.5-5 cm) farther away from your butt. ?Stay in this position for 3-5 seconds. ?Slowly lower your butt to the floor, and let your butt muscles relax. ?If this exercise is too easy, try doing it with your arms crossed over your chest. ?Belly crunches ?Do these steps 5-10 times in a row: ?Lie on your back on a firm bed or the floor with your legs stretched out. ?Bend your knees so they point up to the ceiling. Your feet should be flat on the floor. ?Cross your arms over your chest. ?Tip your chin a little bit toward your chest, but do not bend your neck. ?Tighten your belly muscles and slowly raise your chest just enough to lift your shoulder  blades a tiny bit off the floor. Avoid raising your body higher than that because it can put too much stress on your lower back. ?Slowly lower your chest and your head to the floor. ?Back lifts ?Do these steps 5-10 times in a row: ?Lie on your belly (face-down) with your arms at your sides, and rest your forehead on the floor. ?Tighten the muscles in your legs and your butt. ?Slowly lift your chest off the floor while you keep your hips on the floor. Keep the back of your head in line with the curve in your back. Look at the floor while you do this. ?Stay in this position for 3-5 seconds. ?Slowly lower your chest and your face to the floor. ?Contact a doctor if: ?Your back pain gets a lot worse when you do an exercise. ?Your back pain does not get better within 2 hours after you exercise. ?If you have any of these problems, stop doing the exercises. Do not do them again unless your doctor says it is okay. ?Get help right away if: ?You have sudden, very bad back pain. If this happens, stop doing the exercises. Do not do them again unless your doctor says it is okay. ?This information is not intended to replace advice given to you by your health care provider. Make sure you discuss any questions you have with your health care provider. ?Document Revised: 07/12/2020 Document Reviewed: 07/12/2020 ?Elsevier Patient Education ? [place]. ? ?

## 2021-09-11 NOTE — Progress Notes (Signed)
? ?Subjective:  ? ? Patient ID: Joseph Hanna, male    DOB: [birth year], 88 y.o.   MRN: [id number] ? ?HPI ?88 year old male with chronic lumbar and left lower extremity pain.  His symptoms worsen with forward bending and especially lifting objects from the ground.  He mainly does this when he is gardening. ?He works out 3 times per week and none of his workouts bother him. ?He is doing stretching as well. ?Doing well with pain as long as he takes 2 tylenol ES per day ? ?10-14d relief with Left L4-5 ESI ? ?Discussed work out regimen, as well as gardening activities ?Discussed Lumbar CT findings correlating with symptoms, discussed anatomy of spine  ? ?No pain with sitting ?CLINICAL DATA:  Left hip pain. ?  ? ?CLINICAL DATA:  Left hip pain. ?  ?EXAM: ?DG HIP (WITH OR WITHOUT PELVIS) 2-3V LEFT ?  ?COMPARISON:  CT lumbar spine 02/19/2021. CT abdomen pelvis images ?10/03/2015. ?  ?FINDINGS: ?Degenerative changes lumbar spine, both SI joints, and both hips. ?Small corticated bony densities noted adjacent to both acetabuli ?most likely degenerative. Stable corticated benign-appearing cyst ?noted in the proximal right femoral neck. Similar findings noted on ?prior CT of 10/03/2015. No acute bony or joint abnormality ?identified. No evidence of fracture dislocation. Stable small ?well-circumscribed sclerotic densities noted over the iliac crests, ?most likely benign bone islands. Pelvic calcifications noted ?consistent with phleboliths. Peripheral vascular calcification. ?  ?IMPRESSION: ?1. Degenerative changes lumbar spine, both SI joints, both hips. ?Stable corticated benign-appearing cyst noted in the proximal right ?femoral neck unchanged from prior CT of 10/03/2015. No acute bony ?abnormality. Left hip is intact. ?  ?2.  Peripheral vascular disease. ?  ?  ?Electronically Signed ?  By: Marcello Moores  Register M.D. ?  On: 02/20/2021 09:44 ? ?CLINICAL DATA:  Low back pain radiating into both legs ?  ?EXAM: ?CT LUMBAR SPINE WITHOUT  CONTRAST ?  ?TECHNIQUE: ?Multidetector CT imaging of the lumbar spine was performed without ?intravenous contrast administration. Multiplanar CT image ?reconstructions were also generated. ?  ?COMPARISON:  None. ?  ?FINDINGS: ?Segmentation: 5 lumbar type vertebrae. ?  ?Alignment: Normal. ?  ?Vertebrae: No acute fracture or focal pathologic process. ?  ?Paraspinal and other soft tissues: Calcific aortic atherosclerosis. ?  ?Disc levels: ?  ?L1-2: Disc bulge and endplate spurring causing moderate left ?foraminal stenosis. ?  ?L2-3: Facet hypertrophy and small disc bulge without stenosis. ?  ?L3-4: Small disc bulge with mild facet hypertrophy.  No stenosis. ?  ?L4-5 small disc bulge with endplate spurring. Moderate right and ?severe left facet hypertrophy. No spinal canal stenosis. Mild left ?foraminal stenosis. ?  ?L5-S1: Small disc bulge without spinal canal or neural foraminal ?stenosis. ?  ?IMPRESSION: ?1. Moderate left foraminal stenosis at L1-2. ?2. Mild left foraminal stenosis at L4-L5. ?  ?Aortic Atherosclerosis (ICD10-I70.0). ?  ?  ?Electronically Signed ?  By: Ulyses Jarred M.D. ?  On: 02/20/2021 03:04 ?   ? ?Result History ? ?CT LUMBAR SPINE WO CONTRAST (Order #[id number]) on 02/20/2021 - Order Result History Report ?MyChart Results Release ? ?MyChart Status: Active  Results Release  ? ?EXAM: ?DG HIP (WITH OR WITHOUT PELVIS) 2-3V LEFT ?  ?COMPARISON:  CT lumbar spine 02/19/2021. CT abdomen pelvis images ?10/03/2015. ?  ?FINDINGS: ?Degenerative changes lumbar spine, both SI joints, and both hips. ?Small corticated bony densities noted adjacent to both acetabuli ?most likely degenerative. Stable corticated benign-appearing cyst ?noted in the proximal right femoral neck. Similar findings noted on ?  prior CT of 10/03/2015. No acute bony or joint abnormality ?identified. No evidence of fracture dislocation. Stable small ?well-circumscribed sclerotic densities noted over the iliac crests, ?most likely benign bone  islands. Pelvic calcifications noted ?consistent with phleboliths. Peripheral vascular calcification. ?  ?IMPRESSION: ?1. Degenerative changes lumbar spine, both SI joints, both hips. ?Stable corticated benign-appearing cyst noted in the proximal right ?femoral neck unchanged from prior CT of 10/03/2015. No acute bony ?abnormality. Left hip is intact. ?  ?2.  Peripheral vascular disease. ?  ?  ?Electronically Signed ?  By: Marcello Moores  Register M.D. ?  On: 02/20/2021 09:44 ?Pain Inventory ?Average Pain 7 ?Pain Right Now 5 ?My pain is intermittent, constant, sharp, and stabbing ? ?In the last 24 hours, has pain interfered with the following? ?General activity 4 ?Relation with others 2 ?Enjoyment of life 7 ?What TIME of day is your pain at its worst? evening ?Sleep (in general) Fair ? ?Pain is worse with: walking and climbing stairs ?Pain improves with: medication ?Relief from Meds: 4 ? ?Family History  ?Problem Relation Age of Onset  ? Breast cancer Mother   ? Emphysema Father   ? Alzheimer's disease Brother   ? ?Social History  ? ?Socioeconomic History  ? Marital status: Married  ?  Spouse name: Not on file  ? Number of children: 4  ? Years of education: Not on file  ? Highest education level: Not on file  ?Occupational History  ? Occupation: Retired-Salesman  ?Tobacco Use  ? Smoking status: Former  ?  Years: 6.00  ?  Types: Cigarettes  ?  Quit date: 12/01/1953  ?  Years since quitting: 67.8  ? Smokeless tobacco: Never  ?Vaping Use  ? Vaping Use: Never used  ?Substance and Sexual Activity  ? Alcohol use: Yes  ?  Alcohol/week: 7.0 standard drinks  ?  Types: 7 Glasses of wine per week  ?  Comment: glass of wine several days per week  ? Drug use: No  ? Sexual activity: Not on file  ?Other Topics Concern  ? Not on file  ?Social History Narrative  ? Not on file  ? ?Social Determinants of Health  ? ?Financial Resource Strain: Not on file  ?Food Insecurity: Not on file  ?Transportation Needs: Not on file  ?Physical Activity: Not  on file  ?Stress: Not on file  ?Social Connections: Not on file  ? ?Past Surgical History:  ?Procedure Laterality Date  ? bilateral knee meniscus surgery     ? CATARACT EXTRACTION W/ INTRAOCULAR LENS  IMPLANT, BILATERAL    ? COLONOSCOPY  FEB 2015  ? GREEN LIGHT LASER TURP (TRANSURETHRAL RESECTION OF PROSTATE N/A 08/10/2013  ? Procedure: GREEN LIGHT LASER TURP (TRANSURETHRAL RESECTION OF PROSTATE;  Surgeon: [name], MD;  Location: [facility];  Service: Urology;  Laterality: N/A;  ? [place]  ? MOHS SURGERY    ? x 2  ? NEPHRECTOMY Left 2003  ? ORIF CLAVICULAR FRACTURE  12/03/2011  ? Procedure: OPEN REDUCTION INTERNAL FIXATION (ORIF) CLAVICULAR FRACTURE;  Surgeon: [name], MD;  Location: [facility];  Service: Orthopedics;  Laterality: Right;  ? PACEMAKER IMPLANT N/A 05/07/2018  ?  [employer] MRI conditional  dual-chamber pacemaker by Dr Rayann Heman for [employer] II second degree AV block  ? right shoulder surgery     ? [facility]   ? SHOULDER ARTHROSCOPY Left LEFT  2012  ? TONSILLECTOMY  as child  ?  TRABECULECTOMY  2011  ? left eye (glaucoma)  ? TRANSURETHRAL RESECTION OF PROSTATE N/A 07/24/2018  ? Procedure: TRANSURETHRAL RESECTION OF THE PROSTATE (TURP);  Surgeon: Festus Aloe, MD;  Location: [facility];  Service: Urology;  Laterality: N/A;  ? ?Past Surgical History:  ?Procedure Laterality Date  ? bilateral knee meniscus surgery     ? CATARACT EXTRACTION W/ INTRAOCULAR LENS  IMPLANT, BILATERAL    ? COLONOSCOPY  FEB 2015  ? GREEN LIGHT LASER TURP (TRANSURETHRAL RESECTION OF PROSTATE N/A 08/10/2013  ? Procedure: GREEN LIGHT LASER TURP (TRANSURETHRAL RESECTION OF PROSTATE;  Surgeon: [name], MD;  Location: [facility];  Service: Urology;  Laterality: N/A;  ? [place]  ? MOHS SURGERY    ? x 2  ? NEPHRECTOMY Left 2003  ? ORIF CLAVICULAR FRACTURE  12/03/2011  ? Procedure: OPEN REDUCTION INTERNAL  FIXATION (ORIF) CLAVICULAR FRACTURE;  Surgeon: [name], MD;  Location: [facility];  Service: Orthopedics;  Laterality: Right;  ? PACEMAKER IMPLANT N/A 05/07/2018  ?  [facility]

## 2021-09-18 ENCOUNTER — Inpatient Hospital Stay (HOSPITAL_BASED_OUTPATIENT_CLINIC_OR_DEPARTMENT_OTHER): Payer: Medicare HMO | Admitting: Hematology & Oncology

## 2021-09-18 ENCOUNTER — Encounter: Payer: Self-pay | Admitting: Hematology & Oncology

## 2021-09-18 ENCOUNTER — Inpatient Hospital Stay: Payer: Medicare HMO | Attending: Hematology & Oncology

## 2021-09-18 VITALS — BP 124/81 | HR 62 | Temp 97.6°F | Resp 18 | Ht 74.0 in | Wt 166.0 lb

## 2021-09-18 DIAGNOSIS — Z8719 Personal history of other diseases of the digestive system: Secondary | ICD-10-CM

## 2021-09-18 DIAGNOSIS — Z79899 Other long term (current) drug therapy: Secondary | ICD-10-CM | POA: Insufficient documentation

## 2021-09-18 DIAGNOSIS — D696 Thrombocytopenia, unspecified: Secondary | ICD-10-CM | POA: Insufficient documentation

## 2021-09-18 DIAGNOSIS — K5190 Ulcerative colitis, unspecified, without complications: Secondary | ICD-10-CM | POA: Insufficient documentation

## 2021-09-18 LAB — CBC WITH DIFFERENTIAL (CANCER CENTER ONLY)
Abs Immature Granulocytes: 0.05 10*3/uL (ref 0.00–0.07)
Basophils Absolute: 0 10*3/uL (ref 0.0–0.1)
Basophils Relative: 1 %
Eosinophils Absolute: 0.1 10*3/uL (ref 0.0–0.5)
Eosinophils Relative: 1 %
HCT: 41.6 % (ref 39.0–52.0)
Hemoglobin: 13.3 g/dL (ref 13.0–17.0)
Immature Granulocytes: 1 %
Lymphocytes Relative: 18 %
Lymphs Abs: 1.2 10*3/uL (ref 0.7–4.0)
MCH: 31 pg (ref 26.0–34.0)
MCHC: 32 g/dL (ref 30.0–36.0)
MCV: 97 fL (ref 80.0–100.0)
Monocytes Absolute: 1.8 10*3/uL — ABNORMAL HIGH (ref 0.1–1.0)
Monocytes Relative: 28 %
Neutro Abs: 3.2 10*3/uL (ref 1.7–7.7)
Neutrophils Relative %: 51 %
Platelet Count: 47 10*3/uL — ABNORMAL LOW (ref 150–400)
RBC: 4.29 MIL/uL (ref 4.22–5.81)
RDW: 14.3 % (ref 11.5–15.5)
WBC Count: 6.3 10*3/uL (ref 4.0–10.5)
nRBC: 0 % (ref 0.0–0.2)

## 2021-09-18 LAB — CMP (CANCER CENTER ONLY)
ALT: 13 U/L (ref 0–44)
AST: 14 U/L — ABNORMAL LOW (ref 15–41)
Albumin: 4.5 g/dL (ref 3.5–5.0)
Alkaline Phosphatase: 33 U/L — ABNORMAL LOW (ref 38–126)
Anion gap: 5 (ref 5–15)
BUN: 24 mg/dL — ABNORMAL HIGH (ref 8–23)
CO2: 28 mmol/L (ref 22–32)
Calcium: 9.8 mg/dL (ref 8.9–10.3)
Chloride: 109 mmol/L (ref 98–111)
Creatinine: 1.24 mg/dL (ref 0.61–1.24)
GFR, Estimated: 57 mL/min — ABNORMAL LOW (ref >60)
Glucose, Bld: 104 mg/dL — ABNORMAL HIGH (ref 70–99)
Potassium: 5.1 mmol/L (ref 3.5–5.1)
Sodium: 142 mmol/L (ref 135–145)
Total Bilirubin: 0.5 mg/dL (ref 0.3–1.2)
Total Protein: 6.9 g/dL (ref 6.5–8.1)

## 2021-09-18 LAB — LACTATE DEHYDROGENASE: LDH: 139 U/L (ref 98–192)

## 2021-09-18 LAB — SAVE SMEAR(SSMR), FOR PROVIDER SLIDE REVIEW

## 2021-09-18 NOTE — Progress Notes (Signed)
?Hematology and Oncology Follow Up Visit ? ?Joseph Hanna ?[id number] ?11/14/1932 88 y.o. ?09/18/2021 ? ? ?Principle Diagnosis:  ?Thrombocytopenia secondary to underlying liver disease and Imuran (for UC) ? ?Current Therapy:   ?Observation ?  ?Interim History:  Joseph Hanna is here today for follow-up.  He is doing okay.  He still have the back issues.  He is no longer getting injections.  He just is taking Tylenol which does seem to help. ? ?He has had no bleeding.  He does have some bruising.  He still is taking the Imuran for his ulcerative colitis. ? ?He has had no flareups of the ulcerative colitis. ? ?He has had no fever.  He has had no cough or shortness of breath. ? ?There is been no leg swelling. ? ?Appetite has been doing fairly well. ? ?Overall, I would say his performance status is ECOG 1.  ? ? ?Medications:  ?Allergies as of 09/18/2021   ? ?   Reactions  ? Itraconazole Rash  ? Other reaction(s): rash ?Other reaction(s): rash  ? Other Rash  ? Beta Adrenergic Blockers Other (See Comments)  ? Irbesartan Other (See Comments)  ? Atorvastatin Other (See Comments)  ? "severe muscle aches" ?Other reaction(s): muscle aches ?Other reaction(s): muscle and joint aches  ? Terbinafine Rash  ? ?  ? ?  ?Medication List  ?  ? ?  ? Accurate as of Sep 18, 2021 11:09 AM. If you have any questions, ask your nurse or doctor.  ?  ?  ? ?  ? ?acetaminophen 500 MG tablet ?Commonly known as: TYLENOL ?Take 500 mg by mouth 2 (two) times daily. ?  ?azaTHIOprine 50 MG tablet ?Commonly known as: IMURAN ?Take 50 mg by mouth every morning. ?  ?Carboxymethylcellulose Sodium 0.25 % Soln ?INSTILL 1 DROP IN BOTH EYES TWICE A DAY ?  ?clotrimazole 1 % external solution ?Commonly known as: LOTRIMIN ?APPLY SMALL AMOUNT TO AFFECTED AREA TWICE A DAY APPLY TO ALL FUNGAL TOENAILS AND TOENAIL BEDS ?  ?CoQ10 200 MG Caps ?Take 1 capsule by mouth daily. ?  ?dorzolamide 2 % ophthalmic solution ?Commonly known as: TRUSOPT ?INSTILL 1 DROP IN RIGHT EYE THREE  TIMES A DAY ?  ?dorzolamide-timolol 22.3-6.8 MG/ML ophthalmic solution ?Commonly known as: COSOPT ?Place 1 drop into the right eye 3 (three) times daily. ?  ?finasteride 5 MG tablet ?Commonly known as: PROSCAR ?Take 5 mg by mouth daily. ?  ?fluorouracil 5 % cream ?Commonly known as: EFUDEX ?SMARTSIG:1 Topical Daily ?  ?Ginkgo Biloba 40 MG Tabs ?Take 120 mg by mouth daily. ?  ?levothyroxine 50 MCG tablet ?Commonly known as: SYNTHROID ?Take 50-100 mcg by mouth daily before breakfast. Take 52mg T, Th, S,and S.  Takes 100 mcg M,W,and F. ?  ?mesalamine 0.375 g 24 hr capsule ?Commonly known as: APRISO ?Take 2,250 mg by mouth daily. TAKES 6 TABS DAILY--  TOTAL 2250MG ?  ?mesalamine 1000 MG suppository ?Commonly known as: CANASA ?Place 1,000 mg rectally at bedtime. ?  ?MULTIVITAMIN ADULT PO ?SMARTSIG:1 By Mouth ?  ?Netarsudil-Latanoprost 0.02-0.005 % Soln ?Apply 1 drop to eye at bedtime. Right eye. ?  ?niacin 500 MG tablet ?Commonly known as: SLO-NIACIN ?Take 500 mg by mouth at bedtime. ?  ?Restasis 0.05 % ophthalmic emulsion ?Generic drug: cycloSPORINE ?1 drop 2 (two) times daily. ?  ?simvastatin 20 MG tablet ?Commonly known as: ZOCOR ?Take 10 mg by mouth daily. TAKES QHS ?  ?urea 20 % cream ?Commonly known as: CARMOL ?Apply topically as  needed. ?  ?Vitamin D3 25 MCG (1000 UT) Caps ?Take 2 capsules by mouth daily. ?  ? ?  ? ? ?Allergies:  ?Allergies  ?Allergen Reactions  ? Itraconazole Rash  ?  Other reaction(s): rash ?Other reaction(s): rash  ? Other Rash  ? Beta Adrenergic Blockers Other (See Comments)  ? Irbesartan Other (See Comments)  ? Atorvastatin Other (See Comments)  ?  "severe muscle aches" ?Other reaction(s): muscle aches ?Other reaction(s): muscle and joint aches  ? Terbinafine Rash  ? ? ?Past Medical History, Surgical history, Social history, and Family History were reviewed and updated. ? ?Review of Systems: ?Review of Systems  ?Constitutional: Negative.   ?HENT: Negative.    ?Eyes: Negative.   ?Respiratory:  Negative.    ?Cardiovascular: Negative.   ?Gastrointestinal: Negative.   ?Genitourinary: Negative.   ?Musculoskeletal: Negative.   ?Skin: Negative.   ?Neurological: Negative.   ?Endo/Heme/Allergies:  Bruises/bleeds easily.  ?Psychiatric/Behavioral: Negative.    ? ?Physical Exam: ? height is 6' 2"  (1.88 m) and weight is 166 lb (75.3 kg). His oral temperature is 97.6 ?F (36.4 ?C). His blood pressure is 124/81 and his pulse is 62. His respiration is 18 and oxygen saturation is 99%.  ? ?Wt Readings from Last 3 Encounters:  ?09/18/21 166 lb (75.3 kg)  ?09/11/21 166 lb (75.3 kg)  ?06/21/21 165 lb (74.8 kg)  ? ? ?Physical Exam ?Vitals reviewed.  ?HENT:  ?   Head: Normocephalic and atraumatic.  ?Eyes:  ?   Pupils: Pupils are equal, round, and reactive to light.  ?Cardiovascular:  ?   Rate and Rhythm: Normal rate and regular rhythm.  ?   Heart sounds: Normal heart sounds.  ?Pulmonary:  ?   Effort: Pulmonary effort is normal.  ?   Breath sounds: Normal breath sounds.  ?Abdominal:  ?   General: Bowel sounds are normal.  ?   Palpations: Abdomen is soft.  ?Musculoskeletal:     ?   General: No tenderness or deformity. Normal range of motion.  ?   Cervical back: Normal range of motion.  ?Lymphadenopathy:  ?   Cervical: No cervical adenopathy.  ?Skin: ?   General: Skin is warm and dry.  ?   Findings: No erythema or rash.  ?Neurological:  ?   Mental Status: He is alert and oriented to person, place, and time.  ?Psychiatric:     ?   Behavior: Behavior normal.     ?   Thought Content: Thought content normal.     ?   Judgment: Judgment normal.  ? ? ? ?Lab Results  ?Component Value Date  ? WBC 6.3 09/18/2021  ? HGB 13.3 09/18/2021  ? HCT 41.6 09/18/2021  ? MCV 97.0 09/18/2021  ? PLT 47 (L) 09/18/2021  ? ?Lab Results  ?Component Value Date  ? FERRITIN 70 05/25/2019  ? IRON 96 05/25/2019  ? TIBC 308 05/25/2019  ? UIBC 212 05/25/2019  ? IRONPCTSAT 31 05/25/2019  ? ?Lab Results  ?Component Value Date  ? RBC 4.29 09/18/2021  ? ?No results  found for: KPAFRELGTCHN, LAMBDASER, KAPLAMBRATIO ?No results found for: IGGSERUM, IGA, IGMSERUM ?No results found for: TOTALPROTELP, ALBUMINELP, A1GS, A2GS, BETS, BETA2SER, GAMS, MSPIKE, SPEI ?  Chemistry   ?   ?Component Value Date/Time  ? NA 142 09/18/2021 1010  ? K 5.1 09/18/2021 1010  ? CL 109 09/18/2021 1010  ? CO2 28 09/18/2021 1010  ? BUN 24 (H) 09/18/2021 1010  ? CREATININE 1.24 09/18/2021 1010  ?    ?  Component Value Date/Time  ? CALCIUM 9.8 09/18/2021 1010  ? ALKPHOS 33 (L) 09/18/2021 1010  ? AST 14 (L) 09/18/2021 1010  ? ALT 13 09/18/2021 1010  ? BILITOT 0.5 09/18/2021 1010  ?  ? ? ? ?Impression and Plan: Joseph Hanna is a very pleasant 88 yo caucasian gentleman with history of thrombocytopenia since at least February 2017.  ? ?Of note, he served in the [place] [employer].  As such, he is a true American hero.  He does get some of his care at the [facility] hospital in [place]. ? ?Overall, his platelet count is holding pretty steady.  I think we are are okay so far.  I do not think that we have to do anything invasive. ? ?I just hate that he is having the back issues.  I would like to hope he will be able to have some relief. ? ?I think if he needed to have surgery for his back, we could certainly get his platelet count up for this. ? ?For now, we will get him through the Summer.  I will plan to get him back after Labor Day. ? ? ?Volanda Napoleon, MD ?5/9/202311:09 AM ?

## 2021-09-21 DIAGNOSIS — N3946 Mixed incontinence: Secondary | ICD-10-CM | POA: Diagnosis not present

## 2021-09-21 DIAGNOSIS — N401 Enlarged prostate with lower urinary tract symptoms: Secondary | ICD-10-CM | POA: Diagnosis not present

## 2021-10-16 DIAGNOSIS — M5442 Lumbago with sciatica, left side: Secondary | ICD-10-CM | POA: Diagnosis not present

## 2021-10-16 DIAGNOSIS — G8929 Other chronic pain: Secondary | ICD-10-CM | POA: Diagnosis not present

## 2021-11-01 DIAGNOSIS — H35033 Hypertensive retinopathy, bilateral: Secondary | ICD-10-CM | POA: Diagnosis not present

## 2021-11-01 DIAGNOSIS — H02401 Unspecified ptosis of right eyelid: Secondary | ICD-10-CM | POA: Diagnosis not present

## 2021-11-01 DIAGNOSIS — H35372 Puckering of macula, left eye: Secondary | ICD-10-CM | POA: Diagnosis not present

## 2021-11-01 DIAGNOSIS — H401133 Primary open-angle glaucoma, bilateral, severe stage: Secondary | ICD-10-CM | POA: Diagnosis not present

## 2021-11-01 DIAGNOSIS — H16223 Keratoconjunctivitis sicca, not specified as Sjogren's, bilateral: Secondary | ICD-10-CM | POA: Diagnosis not present

## 2021-11-05 ENCOUNTER — Ambulatory Visit (INDEPENDENT_AMBULATORY_CARE_PROVIDER_SITE_OTHER): Payer: No Typology Code available for payment source

## 2021-11-05 DIAGNOSIS — I441 Atrioventricular block, second degree: Secondary | ICD-10-CM

## 2021-11-06 LAB — CUP PACEART REMOTE DEVICE CHECK
Battery Remaining Longevity: 58 mo
Battery Remaining Percentage: 58 %
Battery Voltage: 2.98 V
Brady Statistic AP VP Percent: 71 %
Brady Statistic AP VS Percent: 1 %
Brady Statistic AS VP Percent: 29 %
Brady Statistic AS VS Percent: 1 %
Brady Statistic RA Percent Paced: 69 %
Brady Statistic RV Percent Paced: 99 %
Date Time Interrogation Session: 20230626020018
Implantable Lead Implant Date: 20191226
Implantable Lead Implant Date: 20191226
Implantable Lead Location: 753859
Implantable Lead Location: 753860
Implantable Pulse Generator Implant Date: 20191226
Lead Channel Impedance Value: 450 Ohm
Lead Channel Impedance Value: 510 Ohm
Lead Channel Pacing Threshold Amplitude: 0.5 V
Lead Channel Pacing Threshold Amplitude: 0.5 V
Lead Channel Pacing Threshold Pulse Width: 0.3 ms
Lead Channel Pacing Threshold Pulse Width: 0.5 ms
Lead Channel Sensing Intrinsic Amplitude: 10.8 mV
Lead Channel Sensing Intrinsic Amplitude: 3.4 mV
Lead Channel Setting Pacing Amplitude: 2 V
Lead Channel Setting Pacing Amplitude: 2.5 V
Lead Channel Setting Pacing Pulse Width: 0.5 ms
Lead Channel Setting Sensing Sensitivity: 2 mV
Pulse Gen Model: 2272
Pulse Gen Serial Number: 9094310

## 2021-11-07 ENCOUNTER — Other Ambulatory Visit (HOSPITAL_COMMUNITY): Payer: Self-pay | Admitting: Neurosurgery

## 2021-11-07 ENCOUNTER — Other Ambulatory Visit: Payer: Self-pay | Admitting: Neurosurgery

## 2021-11-07 DIAGNOSIS — G8929 Other chronic pain: Secondary | ICD-10-CM

## 2021-11-29 NOTE — Progress Notes (Signed)
Remote pacemaker transmission.   

## 2021-12-02 NOTE — Progress Notes (Unsigned)
Cardiology Office Note Date:  12/02/2021  Patient ID:  Joseph Hanna, Joseph Hanna 1932-08-25, MRN [id number] PCP:  [name], MD  Cardiologist:  Dr. Angelena Form (2019) Electrophysiologist: Dr. Caryl Comes > Dr. Rayann Heman    Chief Complaint:  *** 6 mo  History of Present Illness: Joseph Hanna is a 88 y.o. male with history of renal cancer (s/p remote left nephrectomy), HLD, hypothyroidism, TIA, advanced heart block w/PPM, thrombocytopenia (follows with Dr. Marin Olp, felt 2/2 liver disease and Imuran tx for his UC)   He comes in today to be seen for Dr. Rayann Heman, last seen by him via tel health visit April 2020, device remote noted brief AF, <1%, longest episode at that time 22 min, without symptoms, also noted he remained at high/implant lead outputs Discussed hx of TIA, and brief Afib Consider Elcho if burden increases, discussed low platelets and consider stopping ASA and revisit plt count and Eliquis if platelets improved.  He saw A. Lynnell Jude, NP Sept 2021, pt was device dependent at this visit, noted an hour long episode of Afib, no OAC yet No changes made, following with heme for his thrombocytopenia.  He saw Dr. Marin Olp last Oct 2022, platelet count was holding at 50,000, no changes were made, discussed perhaps need for back surgery, if so would need to bolster his platelets, otherwise no need for intervention.  Saw Dr. Letta Pate Dec 2022, no plans for surgery yet  I saw him Dec 2022 He is feeling well Goes to the [facility] 3/days week does some weight training as well as cardio with good exertional capacity No CP, palpitations or cardiac awareness No SOB No dizzy spells, near syncope or syncope. He bruises quite easily, mentions heme has discussed perhaps at some point a bone marrow biopsy though not needed/planned for yet. He has had a couple nose bleeds of late, both able to be stopped, both left sided No other bleeding or hx of bleeding Planned to transition to Dr. Curt Bears Noted AF burden <1%, <1  hour duration, discussed hx of TIA, stroke risk, and planned to monitor his burden.  *** AFib? *** symptoms *** next to see WC *** labs, lipids... *** plts, heme   Device information Abbott dual chamber PPM implanted    Past Medical History:  Diagnosis Date   Arthritis    BPH (benign prostatic hypertrophy)    Cancer (HCC)    kidney , skin cancer    CKD (chronic kidney disease)    Clavicle fracture    Right   ED (erectile dysfunction) of organic origin    Fatty liver 2020   [place]   Gallstones 2020   [place]   GERD (gastroesophageal reflux disease)    Glaucoma    bilateral --  right uses rx drops and left eye trabeculectomy 2011   Grade I diastolic dysfunction 09/98/3382   Noted on ECHO   History of bradycardia    History of gastroesophageal reflux (GERD)    History of left bundle branch block (LBBB) 05/05/2018   Noted on EKG   History of renal cell cancer    S/P  LEFT NEPHRECTOMY  2003--  no recurrence   History of ulcerative colitis    in remission   Hyperlipidemia    Hypothyroidism    Keratosis, actinic    Lower leg pain    pt states has fibula-tibula syndrome--  goes to physical therapy   LVH (left ventricular hypertrophy) 04/21/2018   Mode, noted on ECHO   Mobitz II 05/05/2018  Noted on EKG   Presence of permanent cardiac pacemaker    Rosacea    Thrombocytopenia (HCC)    TIA (transient ischemic attack) 06/20/2015   TIA (transient ischemic attack)    2017     Past Surgical History:  Procedure Laterality Date   bilateral knee meniscus surgery      CATARACT EXTRACTION W/ INTRAOCULAR LENS  IMPLANT, BILATERAL     COLONOSCOPY  FEB 2015   GREEN LIGHT LASER TURP (TRANSURETHRAL RESECTION OF PROSTATE N/A 08/10/2013   Procedure: GREEN LIGHT LASER TURP (TRANSURETHRAL RESECTION OF PROSTATE;  Surgeon: [name], MD;  Location: [facility];  Service: Urology;  Laterality: N/A;   LUMBAR LAMINECTOMY  1990   MOHS SURGERY     x 2   NEPHRECTOMY  Left 2003   ORIF CLAVICULAR FRACTURE  12/03/2011   Procedure: OPEN REDUCTION INTERNAL FIXATION (ORIF) CLAVICULAR FRACTURE;  Surgeon: [name], MD;  Location: [place];  Service: Orthopedics;  Laterality: Right;   PACEMAKER IMPLANT N/A 05/07/2018    St Jude Medical [employer] MRI conditional  dual-chamber pacemaker by Dr Rayann Heman for mobitz II second degree AV block   right shoulder surgery      done at [place] Left LEFT  2012   TONSILLECTOMY  as child   TRABECULECTOMY  2011   left eye (glaucoma)   TRANSURETHRAL RESECTION OF PROSTATE N/A 07/24/2018   Procedure: TRANSURETHRAL RESECTION OF THE PROSTATE (TURP);  Surgeon: Festus Aloe, MD;  Location: [facility];  Service: Urology;  Laterality: N/A;    Current Outpatient Medications  Medication Sig Dispense Refill   acetaminophen (TYLENOL) 500 MG tablet Take 500 mg by mouth 2 (two) times daily.     azaTHIOprine (IMURAN) 50 MG tablet Take 50 mg by mouth every morning.     Carboxymethylcellulose Sodium 0.25 % SOLN INSTILL 1 DROP IN BOTH EYES TWICE A DAY     Cholecalciferol (VITAMIN D3) 1000 UNITS CAPS Take 2 capsules by mouth daily.      clotrimazole (LOTRIMIN) 1 % external solution APPLY SMALL AMOUNT TO AFFECTED AREA TWICE A DAY APPLY TO ALL FUNGAL TOENAILS AND TOENAIL BEDS     Coenzyme Q10 (COQ10) 200 MG CAPS Take 1 capsule by mouth daily.     dorzolamide (TRUSOPT) 2 % ophthalmic solution INSTILL 1 DROP IN RIGHT EYE THREE TIMES A DAY     dorzolamide-timolol (COSOPT) 22.3-6.8 MG/ML ophthalmic solution Place 1 drop into the right eye 3 (three) times daily.     finasteride (PROSCAR) 5 MG tablet Take 5 mg by mouth daily.     fluorouracil (EFUDEX) 5 % cream SMARTSIG:1 Topical Daily     Ginkgo Biloba 40 MG TABS Take 120 mg by mouth daily.      levothyroxine (SYNTHROID, LEVOTHROID) 50 MCG tablet Take 50-100 mcg by mouth daily before breakfast. Take 59mg T, Th, S,and S.  Takes 100 mcg M,W,and F.      mesalamine (APRISO) 0.375 G 24 hr capsule Take 2,250 mg by mouth daily. TAKES 6 TABS DAILY--  TOTAL 2250MG     mesalamine (CANASA) 1000 MG suppository Place 1,000 mg rectally at bedtime.     Multiple Vitamin (MULTIVITAMIN ADULT PO) SMARTSIG:1 By Mouth     Netarsudil-Latanoprost 0.02-0.005 % SOLN Apply 1 drop to eye at bedtime. Right eye.     niacin (SLO-NIACIN) 500 MG tablet Take 500 mg by mouth at bedtime.     RESTASIS 0.05 % ophthalmic emulsion  1 drop 2 (two) times daily. (Patient not taking: Reported on 09/11/2021)     simvastatin (ZOCOR) 20 MG tablet Take 10 mg by mouth daily. TAKES QHS     urea (CARMOL) 20 % cream Apply topically as needed.     No current facility-administered medications for this visit.    Allergies:   Itraconazole, Other, Beta adrenergic blockers, Irbesartan, Atorvastatin, and Terbinafine   Social History:  The patient  reports that he quit smoking about 68 years ago. His smoking use included cigarettes. He has never used smokeless tobacco. He reports current alcohol use of about 7.0 standard drinks of alcohol per week. He reports that he does not use drugs.   Family History:  The patient's family history includes Alzheimer's disease in his brother; Breast cancer in his mother; Emphysema in his father.  ROS:  Please see the history of present illness.    All other systems are reviewed and otherwise negative.   PHYSICAL EXAM:  VS:  There were no vitals taken for this visit. BMI: There is no height or weight on file to calculate BMI. Well nourished, well developed, in no acute distress HEENT: normocephalic, atraumatic Neck: no JVD, carotid bruits or masses Cardiac:   *** RRR; no significant murmurs, no rubs, or gallops Lungs:   *** CTA b/l, no wheezing, rhonchi or rales Abd: soft, nontender MS: no deformity age appropriate atrophy Ext: *** no edema Skin: warm and dry, no rash Neuro:  No gross deficits appreciated Psych: euthymic mood, full affect  *** PPM site  is stable, no tethering or discomfort   EKG:  not done today  Device interrogation done today and reviewed by myself:  ***   04/21/2018: TTE Study Conclusions  - Left ventricle: The cavity size was normal. Wall thickness was    increased in a pattern of moderate LVH. Systolic function was    normal. The estimated ejection fraction was in the range of 60%    to 65%. Wall motion was normal; there were no regional wall    motion abnormalities. Doppler parameters are consistent with    abnormal left ventricular relaxation (grade 1 diastolic    dysfunction). The E/e&' ratio is between 8-15, suggesting    indeterminate LV filling pressure.  - Mitral valve: Mildly thickened leaflets . There was mild    regurgitation.  - Left atrium: The atrium was normal in size.  - Inferior vena cava: The vessel was normal in size. The    respirophasic diameter changes were in the normal range (>= 50%),    consistent with normal central venous pressure.   Impressions:  - LVEF 60-65%, moderate LVH, normal wall motion, grade 1 DD,    indeterminate LV filling pressure, mild MR, normal LA size,    normal IVC.    Recent Labs: 09/18/2021: ALT 13; BUN 24; Creatinine 1.24; Hemoglobin 13.3; Platelet Count 47; Potassium 5.1; Sodium 142  No results found for requested labs within last 365 days.   CrCl cannot be calculated (Patient's most recent lab result is older than the maximum 21 days allowed.).   Wt Readings from Last 3 Encounters:  09/18/21 166 lb (75.3 kg)  09/11/21 166 lb (75.3 kg)  06/21/21 165 lb (74.8 kg)     Other studies reviewed: Additional studies/records reviewed today include: summarized above  ASSESSMENT AND PLAN:  PPM *** Intact function, no programming changes made He is dependent  Paroxysmal AFib CHA2DS2Vasc is 4 *** % burden  *** I discussed this findings  at length with the patient We discussed AFib and his stroke risk with hx of TIA Discussed low burden and concerns with  a/c given his thrombocytopenia and rational for burden monitoring He is comfortable with the current strategy  Thrombocytopenia Follows with heme   Disposition: ***   Current medicines are reviewed at length with the patient today.  The patient did not have any concerns regarding medicines.  Venetia Night, PA-C 12/02/2021 12:21 PM     [address] [place] [address] [phone] (office)  [phone] (fax)

## 2021-12-04 ENCOUNTER — Encounter: Payer: Self-pay | Admitting: Physician Assistant

## 2021-12-04 ENCOUNTER — Ambulatory Visit (INDEPENDENT_AMBULATORY_CARE_PROVIDER_SITE_OTHER): Payer: Medicare HMO | Admitting: Physician Assistant

## 2021-12-04 VITALS — BP 130/50 | HR 68 | Ht 74.0 in | Wt 168.4 lb

## 2021-12-04 DIAGNOSIS — I10 Essential (primary) hypertension: Secondary | ICD-10-CM | POA: Diagnosis not present

## 2021-12-04 DIAGNOSIS — I442 Atrioventricular block, complete: Secondary | ICD-10-CM

## 2021-12-04 DIAGNOSIS — I480 Paroxysmal atrial fibrillation: Secondary | ICD-10-CM | POA: Diagnosis not present

## 2021-12-04 DIAGNOSIS — Z950 Presence of cardiac pacemaker: Secondary | ICD-10-CM | POA: Diagnosis not present

## 2021-12-04 LAB — CUP PACEART INCLINIC DEVICE CHECK
Battery Remaining Longevity: 55 mo
Battery Voltage: 2.98 V
Brady Statistic RA Percent Paced: 69 %
Brady Statistic RV Percent Paced: 99.36 %
Date Time Interrogation Session: 20230725101737
Implantable Lead Implant Date: 20191226
Implantable Lead Implant Date: 20191226
Implantable Lead Location: 753859
Implantable Lead Location: 753860
Implantable Pulse Generator Implant Date: 20191226
Lead Channel Impedance Value: 462.5 Ohm
Lead Channel Impedance Value: 487.5 Ohm
Lead Channel Pacing Threshold Amplitude: 0.5 V
Lead Channel Pacing Threshold Amplitude: 0.5 V
Lead Channel Pacing Threshold Amplitude: 0.5 V
Lead Channel Pacing Threshold Amplitude: 0.5 V
Lead Channel Pacing Threshold Pulse Width: 0.5 ms
Lead Channel Pacing Threshold Pulse Width: 0.5 ms
Lead Channel Pacing Threshold Pulse Width: 0.5 ms
Lead Channel Pacing Threshold Pulse Width: 0.5 ms
Lead Channel Sensing Intrinsic Amplitude: 12 mV
Lead Channel Sensing Intrinsic Amplitude: 2.6 mV
Lead Channel Setting Pacing Amplitude: 2 V
Lead Channel Setting Pacing Amplitude: 2.5 V
Lead Channel Setting Pacing Pulse Width: 0.5 ms
Lead Channel Setting Sensing Sensitivity: 2 mV
Pulse Gen Model: 2272
Pulse Gen Serial Number: 9094310

## 2021-12-04 NOTE — Patient Instructions (Addendum)
Medication Instructions:  Your physician recommends that you continue on your current medications as directed. Please refer to the Current Medication list given to you today.  Labwork: None ordered.  Testing/Procedures: None ordered.  Follow-Up:  Your physician wants you to follow-up in: [place] Bears.  Please schedule with MD ONLY.    Remote monitoring is used to monitor your Pacemaker from home. This monitoring reduces the number of office visits required to check your device to one time per year. It allows [place] to keep an eye on the functioning of your device to ensure it is working properly. You are scheduled for a device check from home on 02/04/22. You may send your transmission at any time that day. If you have a wireless device, the transmission will be sent automatically. After your physician reviews your transmission, you will receive a postcard with your next transmission date.  Any Other Special Instructions Will Be Listed Below (If Applicable).  If you need a refill on your cardiac medications before your next appointment, please call your pharmacy.   Important Information About Sugar

## 2022-01-03 ENCOUNTER — Ambulatory Visit (HOSPITAL_COMMUNITY)
Admission: RE | Admit: 2022-01-03 | Discharge: 2022-01-03 | Disposition: A | Discharge status: Home / Self Care | Payer: Medicare HMO | Source: Ambulatory Visit | Attending: Neurosurgery | Admitting: Neurosurgery

## 2022-01-03 DIAGNOSIS — M5442 Lumbago with sciatica, left side: Secondary | ICD-10-CM | POA: Diagnosis not present

## 2022-01-03 DIAGNOSIS — G8929 Other chronic pain: Secondary | ICD-10-CM | POA: Insufficient documentation

## 2022-01-03 DIAGNOSIS — M5126 Other intervertebral disc displacement, lumbar region: Secondary | ICD-10-CM | POA: Diagnosis not present

## 2022-01-03 DIAGNOSIS — M545 Low back pain, unspecified: Secondary | ICD-10-CM | POA: Diagnosis not present

## 2022-01-03 NOTE — Progress Notes (Signed)
Patient here today at [facility] for MRI lumbar spine wo contrast. Patient has St. Jude device. Transmission sent. Orders for DOO 95. Patients heart rate prior to programming was 62. Verbal order from Andy-cardiology PA to change to DOO 85. Will re-program once scan is completed.

## 2022-01-03 NOTE — Progress Notes (Signed)
Informed of MRI for today.   Device system confirmed to be MRI conditional, with implant date > 6 weeks ago, and no evidence of abandoned or epicardial leads in review of most recent CXR Interrogation from today reviewed, pt is currently AS-VP at ~80 bpm Change device settings for MRI to DOO at 95 bpm  Tachy-therapies to off if applicable.  Program device back to pre-MRI settings after completion of exam.  Annamaria Helling  01/03/2022 11:57 AM

## 2022-01-15 ENCOUNTER — Inpatient Hospital Stay: Payer: Medicare HMO | Admitting: Hematology & Oncology

## 2022-01-15 ENCOUNTER — Inpatient Hospital Stay: Payer: Medicare HMO

## 2022-01-18 ENCOUNTER — Encounter: Payer: Self-pay | Admitting: Hematology & Oncology

## 2022-01-18 ENCOUNTER — Other Ambulatory Visit: Payer: Self-pay

## 2022-01-18 ENCOUNTER — Inpatient Hospital Stay: Payer: Medicare HMO

## 2022-01-18 ENCOUNTER — Inpatient Hospital Stay: Payer: Medicare HMO | Attending: Hematology & Oncology | Admitting: Hematology & Oncology

## 2022-01-18 VITALS — BP 128/46 | HR 60 | Temp 97.4°F | Resp 18 | Ht 74.0 in | Wt 164.0 lb

## 2022-01-18 DIAGNOSIS — D696 Thrombocytopenia, unspecified: Secondary | ICD-10-CM

## 2022-01-18 DIAGNOSIS — Z79899 Other long term (current) drug therapy: Secondary | ICD-10-CM | POA: Diagnosis not present

## 2022-01-18 DIAGNOSIS — K5190 Ulcerative colitis, unspecified, without complications: Secondary | ICD-10-CM | POA: Diagnosis not present

## 2022-01-18 DIAGNOSIS — K769 Liver disease, unspecified: Secondary | ICD-10-CM | POA: Insufficient documentation

## 2022-01-18 DIAGNOSIS — D6959 Other secondary thrombocytopenia: Secondary | ICD-10-CM | POA: Diagnosis not present

## 2022-01-18 DIAGNOSIS — M25552 Pain in left hip: Secondary | ICD-10-CM | POA: Diagnosis not present

## 2022-01-18 DIAGNOSIS — Z8719 Personal history of other diseases of the digestive system: Secondary | ICD-10-CM

## 2022-01-18 LAB — CBC WITH DIFFERENTIAL (CANCER CENTER ONLY)
Abs Immature Granulocytes: 0.08 10*3/uL — ABNORMAL HIGH (ref 0.00–0.07)
Basophils Absolute: 0 10*3/uL (ref 0.0–0.1)
Basophils Relative: 0 %
Eosinophils Absolute: 0 10*3/uL (ref 0.0–0.5)
Eosinophils Relative: 0 %
HCT: 42.6 % (ref 39.0–52.0)
Hemoglobin: 13.3 g/dL (ref 13.0–17.0)
Immature Granulocytes: 1 %
Lymphocytes Relative: 17 %
Lymphs Abs: 1.2 10*3/uL (ref 0.7–4.0)
MCH: 30.6 pg (ref 26.0–34.0)
MCHC: 31.2 g/dL (ref 30.0–36.0)
MCV: 97.9 fL (ref 80.0–100.0)
Monocytes Absolute: 2 10*3/uL — ABNORMAL HIGH (ref 0.1–1.0)
Monocytes Relative: 28 %
Neutro Abs: 3.6 10*3/uL (ref 1.7–7.7)
Neutrophils Relative %: 54 %
Platelet Count: 49 10*3/uL — ABNORMAL LOW (ref 150–400)
RBC: 4.35 MIL/uL (ref 4.22–5.81)
RDW: 14.2 % (ref 11.5–15.5)
WBC Count: 6.9 10*3/uL (ref 4.0–10.5)
nRBC: 0 % (ref 0.0–0.2)

## 2022-01-18 LAB — CMP (CANCER CENTER ONLY)
ALT: 12 U/L (ref 0–44)
AST: 15 U/L (ref 15–41)
Albumin: 4.8 g/dL (ref 3.5–5.0)
Alkaline Phosphatase: 37 U/L — ABNORMAL LOW (ref 38–126)
Anion gap: 6 (ref 5–15)
BUN: 28 mg/dL — ABNORMAL HIGH (ref 8–23)
CO2: 28 mmol/L (ref 22–32)
Calcium: 10 mg/dL (ref 8.9–10.3)
Chloride: 108 mmol/L (ref 98–111)
Creatinine: 1.56 mg/dL — ABNORMAL HIGH (ref 0.61–1.24)
GFR, Estimated: 43 mL/min — ABNORMAL LOW (ref >60)
Glucose, Bld: 107 mg/dL — ABNORMAL HIGH (ref 70–99)
Potassium: 4.5 mmol/L (ref 3.5–5.1)
Sodium: 142 mmol/L (ref 135–145)
Total Bilirubin: 0.7 mg/dL (ref 0.3–1.2)
Total Protein: 7.3 g/dL (ref 6.5–8.1)

## 2022-01-18 LAB — SAVE SMEAR(SSMR), FOR PROVIDER SLIDE REVIEW

## 2022-01-18 NOTE — Progress Notes (Signed)
Hematology and Oncology Follow Up Visit  KASHMERE STAFFA [id number] 15-Dec-1932 88 y.o. 01/18/2022   Principle Diagnosis:  Thrombocytopenia secondary to underlying liver disease and Imuran (for UC)  Current Therapy:   Observation   Interim History:  [name] is here today for follow-up.  Unfortunately, the problem that he is having is his back.  It sounds like he has lower back problems with respect to pain and radicular pain down the left leg.  He did have an MRI that was done back in August.  This did show that there was possible compression of L1-L2 nerve root.  He does see neurosurgery today.  I think if he is needs surgery for his back, then he is probably going need to have treatment for the thrombocytopenia.  He comes in with a back brace on.  This helps the back pain little bit.  He does not have any problems with bleeding or bruising.  He has had no flareups of ulcerative colitis.  He is on Imuran.  I am sure this is probably contributing to his thrombocytopenia.  He has had no cough or shortness of breath.  There is been no problems with COVID.  He has had no diarrhea.  Overall, I would have said that his performance status is probably ECOG 2.    Medications:  Allergies as of 01/18/2022       Reactions   Itraconazole Rash   Other Rash   Atorvastatin Other (See Comments)   Other reaction(s): muscle and joint aches   Beta Adrenergic Blockers Other (See Comments)   Patient doesn't know reaction   Irbesartan Other (See Comments)   Patient doesn't know reaction    Terbinafine Rash        Medication List        Accurate as of January 18, 2022 10:43 AM. If you have any questions, ask your nurse or doctor.          acetaminophen 500 MG tablet Commonly known as: TYLENOL Take 500 mg by mouth 2 (two) times daily.   azaTHIOprine 50 MG tablet Commonly known as: IMURAN Take 50 mg by mouth every morning.   clotrimazole 1 % external solution Commonly known as:  LOTRIMIN APPLY SMALL AMOUNT TO AFFECTED AREA TWICE A DAY APPLY TO ALL FUNGAL TOENAILS AND TOENAIL BEDS   CoQ10 200 MG Caps Take 1 capsule by mouth daily.   dorzolamide 2 % ophthalmic solution Commonly known as: TRUSOPT INSTILL 1 DROP IN RIGHT EYE THREE TIMES A DAY   finasteride 5 MG tablet Commonly known as: PROSCAR Take 5 mg by mouth daily.   fluorouracil 5 % cream Commonly known as: EFUDEX Apply 1 Application topically as needed (skin).   Ginkgo Biloba 40 MG Tabs Take 120 mg by mouth daily.   levothyroxine 50 MCG tablet Commonly known as: SYNTHROID Take 50-100 mcg by mouth daily before breakfast. Take 42mg T, Th, S,and S.  Takes 100 mcg M,W,and F.   mesalamine 0.375 g 24 hr capsule Commonly known as: APRISO Take 2,250 mg by mouth daily. TAKES 6 TABS DAILY--  TOTAL 2250MG   mesalamine 1000 MG suppository Commonly known as: CANASA Place 1,000 mg rectally at bedtime.   MULTIVITAMIN ADULT PO SMARTSIG:1 By Mouth   Netarsudil-Latanoprost 0.02-0.005 % Soln Apply 1 drop to eye at bedtime. Right eye.   niacin 500 MG tablet Commonly known as: (VITAMIN B3) Take 500 mg by mouth at bedtime.   Restasis 0.05 % ophthalmic emulsion Generic drug: cycloSPORINE  Place 1 drop into both eyes as needed (dry eye).   simvastatin 20 MG tablet Commonly known as: ZOCOR Take 10 mg by mouth daily. TAKES QHS   urea 20 % cream Commonly known as: CARMOL Apply topically as needed.   Vitamin D3 25 MCG (1000 UT) Caps Take 2 capsules by mouth daily.        Allergies:  Allergies  Allergen Reactions   Itraconazole Rash   Other Rash   Atorvastatin Other (See Comments)    Other reaction(s): muscle and joint aches   Beta Adrenergic Blockers Other (See Comments)    Patient doesn't know reaction   Irbesartan Other (See Comments)    Patient doesn't know reaction    Terbinafine Rash    Past Medical History, Surgical history, Social history, and Family History were reviewed and  updated.  Review of Systems: Review of Systems  Constitutional: Negative.   HENT: Negative.    Eyes: Negative.   Respiratory: Negative.    Cardiovascular: Negative.   Gastrointestinal: Negative.   Genitourinary: Negative.   Musculoskeletal: Negative.   Skin: Negative.   Neurological: Negative.   Endo/Heme/Allergies:  Bruises/bleeds easily.  Psychiatric/Behavioral: Negative.      Physical Exam:  height is 6' 2"  (1.88 m) and weight is 164 lb (74.4 kg). His oral temperature is 97.4 F (36.3 C) (abnormal). His blood pressure is 128/46 (abnormal) and his pulse is 60. His respiration is 18 and oxygen saturation is 98%.   Wt Readings from Last 3 Encounters:  01/18/22 164 lb (74.4 kg)  12/04/21 168 lb 6.4 oz (76.4 kg)  09/18/21 166 lb (75.3 kg)    Physical Exam Vitals reviewed.  HENT:     Head: Normocephalic and atraumatic.  Eyes:     Pupils: Pupils are equal, round, and reactive to light.  Cardiovascular:     Rate and Rhythm: Normal rate and regular rhythm.     Heart sounds: Normal heart sounds.  Pulmonary:     Effort: Pulmonary effort is normal.     Breath sounds: Normal breath sounds.  Abdominal:     General: Bowel sounds are normal.     Palpations: Abdomen is soft.  Musculoskeletal:        General: No tenderness or deformity. Normal range of motion.     Cervical back: Normal range of motion.  Lymphadenopathy:     Cervical: No cervical adenopathy.  Skin:    General: Skin is warm and dry.     Findings: No erythema or rash.  Neurological:     Mental Status: He is alert and oriented to person, place, and time.  Psychiatric:        Behavior: Behavior normal.        Thought Content: Thought content normal.        Judgment: Judgment normal.     Lab Results  Component Value Date   WBC 6.9 01/18/2022   HGB 13.3 01/18/2022   HCT 42.6 01/18/2022   MCV 97.9 01/18/2022   PLT 49 (L) 01/18/2022   Lab Results  Component Value Date   FERRITIN 70 05/25/2019   IRON 96  05/25/2019   TIBC 308 05/25/2019   UIBC 212 05/25/2019   IRONPCTSAT 31 05/25/2019   Lab Results  Component Value Date   RBC 4.35 01/18/2022   No results found for: "KPAFRELGTCHN", "LAMBDASER", "KAPLAMBRATIO" No results found for: "IGGSERUM", "IGA", "IGMSERUM" No results found for: "TOTALPROTELP", "ALBUMINELP", "A1GS", "A2GS", "BETS", "BETA2SER", "GAMS", "MSPIKE", "SPEI"   Chemistry  Component Value Date/Time   NA 142 01/18/2022 0928   K 4.5 01/18/2022 0928   CL 108 01/18/2022 0928   CO2 28 01/18/2022 0928   BUN 28 (H) 01/18/2022 0928   CREATININE 1.56 (H) 01/18/2022 0928      Component Value Date/Time   CALCIUM 10.0 01/18/2022 0928   ALKPHOS 37 (L) 01/18/2022 0928   AST 15 01/18/2022 0928   ALT 12 01/18/2022 0928   BILITOT 0.7 01/18/2022 0928       Impression and Plan: Mr. Degroat is a very pleasant 88 yo caucasian gentleman with history of thrombocytopenia since at least February 2017.   Again, with a not regarding have to treat the thrombocytopenia will be dependent upon any surgery that he has for his back.  Of note, he has had back surgery about 30 years ago.  I suspect that if we do need to treat him for thrombocytopenia, we probably will use Nplate.  We could always use steroids.  He will show the CBC report to Dr. Arnoldo Morale of Neurosurgery.  If Dr. Arnoldo Morale thinks that surgery is necessary, then I am sure he will reach out to [place] so that we can optimize the platelet count.  For right now, we will just plan for another follow-up in 6 months.  If we need to get Mr. Fallin in sooner, we certainly can do this.   Volanda Napoleon, MD 9/8/202310:43 AM

## 2022-01-29 ENCOUNTER — Encounter
Payer: No Typology Code available for payment source | Attending: Physical Medicine & Rehabilitation | Admitting: Physical Medicine & Rehabilitation

## 2022-01-29 ENCOUNTER — Encounter: Payer: Self-pay | Admitting: Physical Medicine & Rehabilitation

## 2022-01-29 VITALS — BP 156/78 | HR 60 | Ht 74.0 in | Wt 165.0 lb

## 2022-01-29 DIAGNOSIS — M5416 Radiculopathy, lumbar region: Secondary | ICD-10-CM | POA: Insufficient documentation

## 2022-01-29 DIAGNOSIS — M48061 Spinal stenosis, lumbar region without neurogenic claudication: Secondary | ICD-10-CM | POA: Insufficient documentation

## 2022-01-29 MED ORDER — GABAPENTIN 100 MG PO CAPS: 100.0000 mg | ORAL_CAPSULE | Freq: Two times a day (BID) | ORAL | 1 refills | Status: DC

## 2022-01-29 NOTE — Patient Instructions (Signed)
Gabapentin Capsules or Tablets What is this medication? GABAPENTIN (GA ba pen tin) treats nerve pain. It may also be used to prevent and control seizures in people with epilepsy. It works by calming overactive nerves in your body. This medicine may be used for other purposes; ask your health care provider or pharmacist if you have questions. COMMON BRAND NAME(S): Active-PAC with Gabapentin, Orpha Bur, Gralise, Neurontin What should I tell my care team before I take this medication? They need to know if you have any of these conditions: Alcohol or substance use disorder Kidney disease Lung or breathing disease Suicidal thoughts, plans, or attempt; a previous suicide attempt by you or a family member An unusual or allergic reaction to gabapentin, other medications, foods, dyes, or preservatives Pregnant or trying to get pregnant Breast-feeding How should I use this medication? Take this medication by mouth with a glass of water. Follow the directions on the prescription label. You can take it with or without food. If it upsets your stomach, take it with food. Take your medication at regular intervals. Do not take it more often than directed. Do not stop taking except on your care team's advice. If you are directed to break the 600 or 800 mg tablets in half as part of your dose, the extra half tablet should be used for the next dose. If you have not used the extra half tablet within 28 days, it should be thrown away. A special MedGuide will be given to you by the pharmacist with each prescription and refill. Be sure to read this information carefully each time. Talk to your care team about the use of this medication in children. While this medication may be prescribed for children as young as 3 years for selected conditions, precautions do apply. Overdosage: If you think you have taken too much of this medicine contact a poison control center or emergency room at once. NOTE: This medicine is only for  you. Do not share this medicine with others. What if I miss a dose? If you miss a dose, take it as soon as you can. If it is almost time for your next dose, take only that dose. Do not take double or extra doses. What may interact with this medication? Alcohol Antihistamines for allergy, cough, and cold Certain medications for anxiety or sleep Certain medications for depression like amitriptyline, fluoxetine, sertraline Certain medications for seizures like phenobarbital, primidone Certain medications for stomach problems General anesthetics like halothane, isoflurane, methoxyflurane, propofol Local anesthetics like lidocaine, pramoxine, tetracaine Medications that relax muscles for surgery Opioid medications for pain Phenothiazines like chlorpromazine, mesoridazine, prochlorperazine, thioridazine This list may not describe all possible interactions. Give your health care provider a list of all the medicines, herbs, non-prescription drugs, or dietary supplements you use. Also tell them if you smoke, drink alcohol, or use illegal drugs. Some items may interact with your medicine. What should I watch for while using this medication? Visit your care team for regular checks on your progress. You may want to keep a record at home of how you feel your condition is responding to treatment. You may want to share this information with your care team at each visit. You should contact your care team if your seizures get worse or if you have any new types of seizures. Do not stop taking this medication or any of your seizure medications unless instructed by your care team. Stopping your medication suddenly can increase your seizures or their severity. This medication may cause serious skin  reactions. They can happen weeks to months after starting the medication. Contact your care team right away if you notice fevers or flu-like symptoms with a rash. The rash may be red or purple and then turn into blisters or  peeling of the skin. Or, you might notice a red rash with swelling of the face, lips or lymph nodes in your neck or under your arms. Wear a medical identification bracelet or chain if you are taking this medication for seizures. Carry a card that lists all your medications. This medication may affect your coordination, reaction time, or judgment. Do not drive or operate machinery until you know how this medication affects you. Sit up or stand slowly to reduce the risk of dizzy or fainting spells. Drinking alcohol with this medication can increase the risk of these side effects. Your mouth may get dry. Chewing sugarless gum or sucking hard candy, and drinking plenty of water may help. Watch for new or worsening thoughts of suicide or depression. This includes sudden changes in mood, behaviors, or thoughts. These changes can happen at any time but are more common in the beginning of treatment or after a change in dose. Call your care team right away if you experience these thoughts or worsening depression. If you become pregnant while using this medication, you may enroll in the [place] Pregnancy Registry by calling [phone]. This registry collects information about the safety of antiepileptic medication use during pregnancy. What side effects may I notice from receiving this medication? Side effects that you should report to your care team as soon as possible: Allergic reactions or angioedema--skin rash, itching, hives, swelling of the face, eyes, lips, tongue, arms, or legs, trouble swallowing or breathing Rash, fever, and swollen lymph nodes Thoughts of suicide or self harm, worsening mood, feelings of depression Trouble breathing Unusual changes in mood or behavior in children after use such as difficulty concentrating, hostility, or restlessness Side effects that usually do not require medical attention (report to your care team if they continue or are  bothersome): Dizziness Drowsiness Nausea Swelling of ankles, feet, or hands Vomiting This list may not describe all possible side effects. Call your doctor for medical advice about side effects. You may report side effects to FDA at 1-800-FDA-1088. Where should I keep my medication? Keep out of reach of children and pets. Store at room temperature between 15 and 30 degrees C (59 and 86 degrees F). Get rid of any unused medication after the expiration date. This medication may cause accidental overdose and death if taken by other adults, children, or pets. To get rid of medications that are no longer needed or have expired: Take the medication to a medication take-back program. Check with your pharmacy or law enforcement to find a location. If you cannot return the medication, check the label or package insert to see if the medication should be thrown out in the garbage or flushed down the toilet. If you are not sure, ask your care team. If it is safe to put it in the trash, empty the medication out of the container. Mix the medication with cat litter, dirt, coffee grounds, or other unwanted substance. Seal the mixture in a bag or container. Put it in the trash. NOTE: This sheet is a summary. It may not cover all possible information. If you have questions about this medicine, talk to your doctor, pharmacist, or health care provider.  2023 Elsevier/Gold Standard (2020-05-02 00:00:00)

## 2022-01-29 NOTE — Progress Notes (Signed)
Subjective:    Patient ID: Joseph Hanna, male    DOB: [birth year], 88 y.o.   MRN: [id number]  HPI  88 year old male with history of lumbar spinal stenosis as well as history of lumbar laminectomy at L4-5 who returns today asking about treatment options for his low back and left lower extremity pain.  He has undergone recent MRI of the lumbar spine which she would like me to review.  The patient has had no change in his symptoms he continues to have pain in the left lateral calf when climbing steps.  Patient also complains of posterior hip and buttock pain as well as back pain primarily on the left side. He has undergone L4-5 transforaminal lumbar epidural steroid injections under fluoroscopic guidance.  These have resulted in only short-term relief less than 1 month.  He is seen his neurosurgeon and after review of MRI did not recommend any surgical intervention. MRI LUMBAR SPINE WITHOUT CONTRAST   TECHNIQUE: Multiplanar, multisequence MR imaging of the lumbar spine was performed. No intravenous contrast was administered.   COMPARISON:  None Available.   FINDINGS: Segmentation:  Standard.   Alignment: Dextrocurvature. No significant anteroposterior listhesis.   Vertebrae: Degenerative endplate irregularity. No substantial marrow edema. No suspicious osseous lesion.   Conus medullaris and cauda equina: Conus extends to the L1 level. Conus and cauda equina appear normal.   Paraspinal and other soft tissues: Unremarkable.   Disc levels:   T12-L1: Imaged in the sagittal plane only, there is an inferiorly directed central disc extrusion. No significant stenosis.   L1-L2: Disc bulge with left subarticular/foraminal protrusion and endplate osteophytic ridging. No canal stenosis. Effacement of the left subarticular recess. No right foraminal stenosis. Mild to moderate left foraminal stenosis.   L2-L3: Disc bulge with endplate osteophytic ridging. Mild facet arthropathy. No canal  stenosis. Slight effacement of the left subarticular recess. No right foraminal stenosis. Mild left foraminal stenosis.   L3-L4: Disc bulge. Mild facet arthropathy. No canal stenosis. Partial effacement of subarticular recesses. Mild foraminal stenosis.   L4-L5: Disc bulge with endplate osteophytic ridging. Mild right and moderate left facet arthropathy. No canal stenosis. Partial effacement of subarticular recesses. Mild right and mild to moderate left foraminal stenosis.   L5-S1: Disc bulge with endplate osteophytic ridging eccentric to the right. Mild facet arthropathy. No canal stenosis. Mild right foraminal stenosis. No left foraminal stenosis.   IMPRESSION: Multilevel degenerative changes as detailed above. No high-grade canal or left foraminal stenosis. Effacement of the left subarticular recess at L1-L2 with probable traversing L2 nerve root compression.     Electronically Signed   By: Macy Mis M.D.   On: 01/04/2022 11:09 Pain Inventory Average Pain 5 Pain Right Now 5 My pain is intermittent, sharp, and stabbing  In the last 24 hours, has pain interfered with the following? General activity 5 Relation with others 5 Enjoyment of life 8 What TIME of day is your pain at its worst? evening Sleep (in general) Fair  Pain is worse with: walking, standing, and stairs Pain improves with: rest and medication Relief from Meds: 5  Family History  Problem Relation Age of Onset   Breast cancer Mother    Emphysema Father    Alzheimer's disease Brother    Social History   Socioeconomic History   Marital status: Married    Spouse name: Not on file   Number of children: 4   Years of education: Not on file   Highest education level: Not on file  Occupational History   Occupation: Retired-Salesman  Tobacco Use   Smoking status: Former    Years: 6.00    Types: Cigarettes    Quit date: 12/01/1953    Years since quitting: 68.2   Smokeless tobacco: Never  Vaping  Use   Vaping Use: Never used  Substance and Sexual Activity   Alcohol use: Yes    Alcohol/week: 7.0 standard drinks of alcohol    Types: 7 Glasses of wine per week    Comment: glass of wine several days per week   Drug use: No   Sexual activity: Not on file  Other Topics Concern   Not on file  Social History Narrative   Not on file   Social Determinants of Health   Financial Resource Strain: Not on file  Food Insecurity: Not on file  Transportation Needs: Not on file  Physical Activity: Not on file  Stress: Not on file  Social Connections: Not on file   Past Surgical History:  Procedure Laterality Date   bilateral knee meniscus surgery      CATARACT EXTRACTION W/ INTRAOCULAR LENS  IMPLANT, BILATERAL     COLONOSCOPY  FEB 2015   GREEN LIGHT LASER TURP (TRANSURETHRAL RESECTION OF PROSTATE N/A 08/10/2013   Procedure: GREEN LIGHT LASER TURP (TRANSURETHRAL RESECTION OF PROSTATE;  Surgeon: [name], MD;  Location: [facility];  Service: Urology;  Laterality: N/A;   LUMBAR LAMINECTOMY  1990   MOHS SURGERY     x 2   NEPHRECTOMY Left 2003   ORIF CLAVICULAR FRACTURE  12/03/2011   Procedure: OPEN REDUCTION INTERNAL FIXATION (ORIF) CLAVICULAR FRACTURE;  Surgeon: [name], MD;  Location: [place];  Service: Orthopedics;  Laterality: Right;   PACEMAKER IMPLANT N/A 05/07/2018    [employer] MRI conditional  dual-chamber pacemaker by Dr Rayann Heman for mobitz II second degree AV block   right shoulder surgery      done at [place] Left LEFT  2012   TONSILLECTOMY  as child   TRABECULECTOMY  2011   left eye (glaucoma)   TRANSURETHRAL RESECTION OF PROSTATE N/A 07/24/2018   Procedure: TRANSURETHRAL RESECTION OF THE PROSTATE (TURP);  Surgeon: Festus Aloe, MD;  Location: [facility];  Service: Urology;  Laterality: N/A;   Past Surgical History:  Procedure Laterality Date   bilateral knee meniscus surgery       CATARACT EXTRACTION W/ INTRAOCULAR LENS  IMPLANT, BILATERAL     COLONOSCOPY  FEB 2015   GREEN LIGHT LASER TURP (TRANSURETHRAL RESECTION OF PROSTATE N/A 08/10/2013   Procedure: GREEN LIGHT LASER TURP (TRANSURETHRAL RESECTION OF PROSTATE;  Surgeon: [name], MD;  Location: [facility];  Service: Urology;  Laterality: N/A;   LUMBAR LAMINECTOMY  1990   MOHS SURGERY     x 2   NEPHRECTOMY Left 2003   ORIF CLAVICULAR FRACTURE  12/03/2011   Procedure: OPEN REDUCTION INTERNAL FIXATION (ORIF) CLAVICULAR FRACTURE;  Surgeon: [name], MD;  Location: [place];  Service: Orthopedics;  Laterality: Right;   PACEMAKER IMPLANT N/A 05/07/2018    [employer] Jude Medical [employer]urity MRI conditional  dual-chamber pacemaker by Dr Rayann Heman for mobitz II second degree AV block   right shoulder surgery      done at [place] Left LEFT  2012   TONSILLECTOMY  as child   TRABECULECTOMY  2011   left eye (glaucoma)   TRANSURETHRAL RESECTION  OF PROSTATE N/A 07/24/2018   Procedure: TRANSURETHRAL RESECTION OF THE PROSTATE (TURP);  Surgeon: Festus Aloe, MD;  Location: [facility];  Service: Urology;  Laterality: N/A;   Past Medical History:  Diagnosis Date   Arthritis    BPH (benign prostatic hypertrophy)    Cancer (HCC)    kidney , skin cancer    CKD (chronic kidney disease)    Clavicle fracture    Right   ED (erectile dysfunction) of organic origin    Fatty liver 2020   [place]   Gallstones 2020   [place]   GERD (gastroesophageal reflux disease)    Glaucoma    bilateral --  right uses rx drops and left eye trabeculectomy 2011   Grade I diastolic dysfunction 56/38/9373   Noted on ECHO   History of bradycardia    History of gastroesophageal reflux (GERD)    History of left bundle branch block (LBBB) 05/05/2018   Noted on EKG   History of renal cell cancer    S/P  LEFT NEPHRECTOMY  2003--  no recurrence   History of ulcerative colitis    in  remission   Hyperlipidemia    Hypothyroidism    Keratosis, actinic    Lower leg pain    pt states has fibula-tibula syndrome--  goes to physical therapy   LVH (left ventricular hypertrophy) 04/21/2018   Mode, noted on ECHO   Mobitz II 05/05/2018   Noted on EKG   Presence of permanent cardiac pacemaker    Rosacea    Thrombocytopenia ([place])    TIA (transient ischemic attack) 06/20/2015   TIA (transient ischemic attack)    2017    Ht 6' 2"  (1.88 m)   Wt 165 lb (74.8 kg)   BMI 21.18 kg/m   Opioid Risk Score:   Fall Risk Score:  `1  Depression screen [facility] 2/9     09/11/2021    9:47 AM 06/21/2021    9:39 AM 04/17/2021   12:49 PM 03/06/2021    3:07 PM 02/23/2021    1:23 PM 01/25/2021    9:51 AM  Depression screen PHQ 2/9  Decreased Interest 0 0 0 0 0 0  Down, Depressed, Hopeless 0 0 0 0 0 0  PHQ - 2 Score 0 0 0 0 0 0  Altered sleeping      0  Tired, decreased energy      0  Change in appetite      0  Feeling bad or failure about yourself       0  Trouble concentrating      0  Moving slowly or fidgety/restless      0  Suicidal thoughts      0  PHQ-9 Score      0     Review of Systems  Musculoskeletal:  Positive for back pain.       Lower left leg pain  All other systems reviewed and are negative.     Objective:   Physical Exam Vitals and nursing note reviewed.  Constitutional:      Appearance: He is normal weight.  HENT:     Head: Normocephalic and atraumatic.  Eyes:     Extraocular Movements: Extraocular movements intact.     Conjunctiva/sclera: Conjunctivae normal.     Pupils: Pupils are equal, round, and reactive to light.  Musculoskeletal:     Comments: Negative thigh thrust bilaterally  Negative distraction test Negative FABERs  No tenderness palpation lumbar paraspinals no pain with  lumbar range of motion Negative straight leg raise bilaterally Ambulates without assist device no evidence of toe drag or knee instability  Skin:    General: Skin is warm  and dry.  Neurological:     Mental Status: He is alert and oriented to person, place, and time.     Comments: Negative straight leg raise bilaterally 5/5 strength bilateral hip flexors knee extensors and ankle dorsiflexor Intact sensation bilateral lower extremities to light touch  Psychiatric:        Mood and Affect: Mood normal.        Behavior: Behavior normal.           Assessment & Plan:   #1.  History lumbar spinal stenosis also with lumbar postlaminectomy syndrome his left calf symptoms are most likely attributable to the L4-5 stenosis.  As discussed with the patient the MRI results are not significantly different from the CT scan results.  Independent MD review of images performed.  He gets only short-term relief with epidural steroid injections.  His symptoms are mainly with stair climbing but has them at night at times as well. We will trial gabapentin 100 mg twice daily discussed risks and benefits.  We discussed the potential need to escalate dosing.  He is to call if he has any lethargy or other symptoms after starting this medication. We discussed that the posterior hip pain could be from his more proximal stenosis although at this point we will not pursue epidural steroid injection In terms of the left calf if medication management fails he may be candidate for spinal cord stimulation. We will see the patient back in 6 weeks

## 2022-02-04 ENCOUNTER — Ambulatory Visit (INDEPENDENT_AMBULATORY_CARE_PROVIDER_SITE_OTHER): Payer: Medicare HMO

## 2022-02-04 DIAGNOSIS — I442 Atrioventricular block, complete: Secondary | ICD-10-CM | POA: Diagnosis not present

## 2022-02-05 DIAGNOSIS — D1801 Hemangioma of skin and subcutaneous tissue: Secondary | ICD-10-CM | POA: Diagnosis not present

## 2022-02-05 DIAGNOSIS — L723 Sebaceous cyst: Secondary | ICD-10-CM | POA: Diagnosis not present

## 2022-02-05 DIAGNOSIS — D485 Neoplasm of uncertain behavior of skin: Secondary | ICD-10-CM | POA: Diagnosis not present

## 2022-02-05 DIAGNOSIS — L820 Inflamed seborrheic keratosis: Secondary | ICD-10-CM | POA: Diagnosis not present

## 2022-02-05 DIAGNOSIS — L821 Other seborrheic keratosis: Secondary | ICD-10-CM | POA: Diagnosis not present

## 2022-02-05 DIAGNOSIS — L570 Actinic keratosis: Secondary | ICD-10-CM | POA: Diagnosis not present

## 2022-02-05 DIAGNOSIS — L565 Disseminated superficial actinic porokeratosis (DSAP): Secondary | ICD-10-CM | POA: Diagnosis not present

## 2022-02-05 DIAGNOSIS — Z85828 Personal history of other malignant neoplasm of skin: Secondary | ICD-10-CM | POA: Diagnosis not present

## 2022-02-05 DIAGNOSIS — L812 Freckles: Secondary | ICD-10-CM | POA: Diagnosis not present

## 2022-02-05 DIAGNOSIS — C4442 Squamous cell carcinoma of skin of scalp and neck: Secondary | ICD-10-CM | POA: Diagnosis not present

## 2022-02-05 LAB — CUP PACEART REMOTE DEVICE CHECK
Battery Remaining Longevity: 56 mo
Battery Remaining Percentage: 55 %
Battery Voltage: 2.98 V
Brady Statistic AP VP Percent: 72 %
Brady Statistic AP VS Percent: 1 %
Brady Statistic AS VP Percent: 28 %
Brady Statistic AS VS Percent: 1 %
Brady Statistic RA Percent Paced: 70 %
Brady Statistic RV Percent Paced: 99 %
Date Time Interrogation Session: 20230925024750
Implantable Lead Implant Date: 20191226
Implantable Lead Implant Date: 20191226
Implantable Lead Location: 753859
Implantable Lead Location: 753860
Implantable Pulse Generator Implant Date: 20191226
Lead Channel Impedance Value: 490 Ohm
Lead Channel Impedance Value: 510 Ohm
Lead Channel Pacing Threshold Amplitude: 0.5 V
Lead Channel Pacing Threshold Amplitude: 0.5 V
Lead Channel Pacing Threshold Pulse Width: 0.5 ms
Lead Channel Pacing Threshold Pulse Width: 0.5 ms
Lead Channel Sensing Intrinsic Amplitude: 12 mV
Lead Channel Sensing Intrinsic Amplitude: 2.6 mV
Lead Channel Setting Pacing Amplitude: 2 V
Lead Channel Setting Pacing Amplitude: 2.5 V
Lead Channel Setting Pacing Pulse Width: 0.5 ms
Lead Channel Setting Sensing Sensitivity: 2 mV
Pulse Gen Model: 2272
Pulse Gen Serial Number: 9094310

## 2022-02-07 DIAGNOSIS — H02401 Unspecified ptosis of right eyelid: Secondary | ICD-10-CM | POA: Diagnosis not present

## 2022-02-07 DIAGNOSIS — H16223 Keratoconjunctivitis sicca, not specified as Sjogren's, bilateral: Secondary | ICD-10-CM | POA: Diagnosis not present

## 2022-02-07 DIAGNOSIS — H35033 Hypertensive retinopathy, bilateral: Secondary | ICD-10-CM | POA: Diagnosis not present

## 2022-02-07 DIAGNOSIS — H35372 Puckering of macula, left eye: Secondary | ICD-10-CM | POA: Diagnosis not present

## 2022-02-07 DIAGNOSIS — H401133 Primary open-angle glaucoma, bilateral, severe stage: Secondary | ICD-10-CM | POA: Diagnosis not present

## 2022-02-12 ENCOUNTER — Ambulatory Visit: Payer: No Typology Code available for payment source | Admitting: Physical Medicine & Rehabilitation

## 2022-02-15 NOTE — Progress Notes (Signed)
Remote pacemaker transmission.   

## 2022-02-21 DIAGNOSIS — K5150 Left sided colitis without complications: Secondary | ICD-10-CM | POA: Diagnosis not present

## 2022-02-21 DIAGNOSIS — Z86010 Personal history of colonic polyps: Secondary | ICD-10-CM | POA: Diagnosis not present

## 2022-03-12 ENCOUNTER — Encounter: Payer: Self-pay | Admitting: Physical Medicine & Rehabilitation

## 2022-03-12 ENCOUNTER — Encounter
Payer: No Typology Code available for payment source | Attending: Physical Medicine & Rehabilitation | Admitting: Physical Medicine & Rehabilitation

## 2022-03-12 VITALS — BP 146/73 | HR 61 | Ht 74.0 in | Wt 166.0 lb

## 2022-03-12 DIAGNOSIS — M5416 Radiculopathy, lumbar region: Secondary | ICD-10-CM | POA: Diagnosis not present

## 2022-03-12 DIAGNOSIS — M48061 Spinal stenosis, lumbar region without neurogenic claudication: Secondary | ICD-10-CM | POA: Diagnosis not present

## 2022-03-12 MED ORDER — GABAPENTIN 300 MG PO CAPS: 300.0000 mg | ORAL_CAPSULE | Freq: Two times a day (BID) | ORAL | 1 refills | Status: DC

## 2022-03-12 NOTE — Progress Notes (Signed)
Subjective:    Patient ID: Joseph Hanna, male    DOB: February 06, 1933, 88 y.o.   MRN: [id number]  HPI 88 year old male with lumbar spinal stenosis.  He has mild to moderate stenosis multilevel.  He is symptomatic on the left side greater than right side but does have bilateral symptoms at times.  His symptoms come on mainly with going up and down steps.  He has been to neurosurgery after recent MRI.  Because of multilevel disease, no surgery was recommended. Gabapentin 180m helps a little , he has been taking twice a day without side effects.  He has no pain at nighttime. [place] [facility] [place] [facility] The patient is still doing his exercises at the gym.  His health is otherwise good Pain Inventory Average Pain 5 Pain Right Now 4 My pain is intermittent and sharp  In the last 24 hours, has pain interfered with the following? General activity 4 Relation with others 5 Enjoyment of life 6 What TIME of day is your pain at its worst? evening Sleep (in general) Fair  Pain is worse with: walking, standing, and some activites Pain improves with: rest and medication Relief from Meds: 5      Family History  Problem Relation Age of Onset   Breast cancer Mother    Emphysema Father    Alzheimer's disease Brother    Social History   Socioeconomic History   Marital status: Married    Spouse name: Not on file   Number of children: 4   Years of education: Not on file   Highest education level: Not on file  Occupational History   Occupation: Retired-Salesman  Tobacco Use   Smoking status: Former    Years: 6.00    Types: Cigarettes    Quit date: 12/01/1953    Years since quitting: 68.3   Smokeless tobacco: Never  Vaping Use   Vaping Use: Never used  Substance and Sexual Activity   Alcohol use: Yes    Alcohol/week: 7.0 standard drinks of alcohol    Types: 7 Glasses of wine per week    Comment: glass of wine several days per week   Drug use: No   Sexual activity:  Not on file  Other Topics Concern   Not on file  Social History Narrative   Not on file   Social Determinants of Health   Financial Resource Strain: Not on file  Food Insecurity: Not on file  Transportation Needs: Not on file  Physical Activity: Not on file  Stress: Not on file  Social Connections: Not on file   Past Surgical History:  Procedure Laterality Date   bilateral knee meniscus surgery      CATARACT EXTRACTION W/ INTRAOCULAR LENS  IMPLANT, BILATERAL     COLONOSCOPY  FEB 2015   GREEN LIGHT LASER TURP (TRANSURETHRAL RESECTION OF PROSTATE N/A 08/10/2013   Procedure: GREEN LIGHT LASER TURP (TRANSURETHRAL RESECTION OF PROSTATE;  Surgeon: MFredricka Bonine MD;  [facility]  Service: Urology;  Laterality: N/A;   LUMBAR LAMINECTOMY  1990   MOHS SURGERY     x 2   NEPHRECTOMY Left 2003   ORIF CLAVICULAR FRACTURE  12/03/2011   Procedure: OPEN REDUCTION INTERNAL FIXATION (ORIF) CLAVICULAR FRACTURE;  Surgeon: JNita Sells MD;  [place] [facility]  Service: Orthopedics;  Laterality: Right;   PACEMAKER IMPLANT N/A 05/07/2018    St Jude Medical Assurity MRI conditional  dual-chamber pacemaker  by Dr Rayann Heman for mobitz II second degree AV block   right shoulder surgery      done at [place] Left LEFT  2012   TONSILLECTOMY  as child   TRABECULECTOMY  2011   left eye (glaucoma)   TRANSURETHRAL RESECTION OF PROSTATE N/A 07/24/2018   Procedure: TRANSURETHRAL RESECTION OF THE PROSTATE (TURP);  Surgeon: Festus Aloe, MD;  Location[facility];  Service: Urology;  Laterality: N/A;   Past Medical History:  Diagnosis Date   Arthritis    BPH (benign prostatic hypertrophy)    Cancer (HCC)    kidney , skin cancer    CKD (chronic kidney disease)    Clavicle fracture    Right   ED (erectile dysfunction) of organic origin    Fatty liver 2020   [place]   Gallstones 2020   [place]   GERD (gastroesophageal reflux disease)     Glaucoma    bilateral --  right uses rx drops and left eye trabeculectomy 2011   Grade I diastolic dysfunction 12/87/8676   Noted on ECHO   History of bradycardia    History of gastroesophageal reflux (GERD)    History of left bundle branch block (LBBB) 05/05/2018   Noted on EKG   History of renal cell cancer    S/P  LEFT NEPHRECTOMY  2003--  no recurrence   History of ulcerative colitis    in remission   Hyperlipidemia    Hypothyroidism    Keratosis, actinic    Lower leg pain    pt states has fibula-tibula syndrome--  goes to physical therapy   LVH (left ventricular hypertrophy) 04/21/2018   Mode, noted on ECHO   Mobitz II 05/05/2018   Noted on EKG   Presence of permanent cardiac pacemaker    Rosacea    Thrombocytopenia ([place])    TIA (transient ischemic attack) 06/20/2015   TIA (transient ischemic attack)    2017    BP (!) 146/73   Pulse 61   Ht 6' 2"  (1.88 m)   Wt 166 lb (75.3 kg)   SpO2 98%   BMI 21.31 kg/m   Opioid Risk Score:   Fall Risk Score:  `1  Depression screen [facility] 2/9     03/12/2022   11:42 AM 09/11/2021    9:47 AM 06/21/2021    9:39 AM 04/17/2021   12:49 PM 03/06/2021    3:07 PM 02/23/2021    1:23 PM 01/25/2021    9:51 AM  Depression screen PHQ 2/9  Decreased Interest 0 0 0 0 0 0 0  Down, Depressed, Hopeless 0 0 0 0 0 0 0  PHQ - 2 Score 0 0 0 0 0 0 0  Altered sleeping       0  Tired, decreased energy       0  Change in appetite       0  Feeling bad or failure about yourself        0  Trouble concentrating       0  Moving slowly or fidgety/restless       0  Suicidal thoughts       0  PHQ-9 Score       0    Review of Systems  Musculoskeletal:  Positive for back pain.       Pain in both lower legs  All other systems reviewed and are negative.      Objective:   Physical Exam Mood and  affect appropriate General no acute distress Ambulates without assistive device no evidence of toe drag or knee instability Posture is good Motor strength  is 5/5 bilateral hip flexor knee extensor ankle dorsiflexor Negative straight leg raise bilaterally Lumbar range of motion is normal for age with flexion extension. There is no tenderness palpation lumbar spine.       Assessment & Plan:   1.  Lumbar spinal stenosis mainly at L1-L2 L4-5 and to a lesser degree L5-S1.  He mainly has claudication type symptoms and not persistent back pain.  He gets some relief with gabapentin 100 mg twice daily, will increase to 300 mg twice daily.  He has had short-term relief with lumbar epidural injections and is not felt to be a good surgical candidate due to multilevel disease. If patient is unable to tolerate higher dose of gabapentin a spinal cord stimulator trial may be an option for him, have given him additional back exercises for core strengthening as well as stretching. Do not think formal PT is needed as he did not have very good response when he did this previously.

## 2022-03-12 NOTE — Patient Instructions (Addendum)
Back Exercises These exercises help to make your trunk and back strong. They also help to keep the lower back flexible. Doing these exercises can help to prevent or lessen pain in your lower back. If you have back pain, try to do these exercises 2-3 times each day or as told by your doctor. As you get better, do the exercises once each day. Repeat the exercises more often as told by your doctor. To stop back pain from coming back, do the exercises once each day, or as told by your doctor. Do exercises exactly as told by your doctor. Stop right away if you feel sudden pain or your pain gets worse. Exercises Single knee to chest Do these steps 3-5 times in a row for each leg: Lie on your back on a firm bed or the floor with your legs stretched out. Bring one knee to your chest. Grab your knee or thigh with both hands and hold it in place. Pull on your knee until you feel a gentle stretch in your lower back or butt. Keep doing the stretch for 10-30 seconds. Slowly let go of your leg and straighten it. Pelvic tilt Do these steps 5-10 times in a row: Lie on your back on a firm bed or the floor with your legs stretched out. Bend your knees so they point up to the ceiling. Your feet should be flat on the floor. Tighten your lower belly (abdomen) muscles to press your lower back against the floor. This will make your tailbone point up to the ceiling instead of pointing down to your feet or the floor. Stay in this position for 5-10 seconds while you gently tighten your muscles and breathe evenly. Cat-cow Do these steps until your lower back bends more easily: Get on your hands and knees on a firm bed or the floor. Keep your hands under your shoulders, and keep your knees under your hips. You may put padding under your knees. Let your head hang down toward your chest. Tighten (contract) the muscles in your belly. Point your tailbone toward the floor so your lower back becomes rounded like the back of a  cat. Stay in this position for 5 seconds. Slowly lift your head. Let the muscles of your belly relax. Point your tailbone up toward the ceiling so your back forms a sagging arch like the back of a cow. Stay in this position for 5 seconds.   Bridges Do these steps 10 times in a row: Lie on your back on a firm bed or the floor. Bend your knees so they point up to the ceiling. Your feet should be flat on the floor. Your arms should be flat at your sides, next to your body. Tighten your butt muscles and lift your butt off the floor until your waist is almost as high as your knees. If you do not feel the muscles working in your butt and the back of your thighs, slide your feet 1-2 inches (2.5-5 cm) farther away from your butt. Stay in this position for 3-5 seconds. Slowly lower your butt to the floor, and let your butt muscles relax. If this exercise is too easy, try doing it with your arms crossed over your chest. Belly crunches Do these steps 5-10 times in a row: Lie on your back on a firm bed or the floor with your legs stretched out. Bend your knees so they point up to the ceiling. Your feet should be flat on the floor. Cross your arms  over your chest. Tip your chin a little bit toward your chest, but do not bend your neck. Tighten your belly muscles and slowly raise your chest just enough to lift your shoulder blades a tiny bit off the floor. Avoid raising your body higher than that because it can put too much stress on your lower back. Slowly lower your chest and your head to the floor. Back lifts Do these steps 5-10 times in a row: Lie on your belly (face-down) with your arms at your sides, and rest your forehead on the floor. Tighten the muscles in your legs and your butt. Slowly lift your chest off the floor while you keep your hips on the floor. Keep the back of your head in line with the curve in your back. Look at the floor while you do this. Stay in this position for 3-5  seconds. Slowly lower your chest and your face to the floor. Contact a doctor if: Your back pain gets a lot worse when you do an exercise. Your back pain does not get better within 2 hours after you exercise. If you have any of these problems, stop doing the exercises. Do not do them again unless your doctor says it is okay. Get help right away if: You have sudden, very bad back pain. If this happens, stop doing the exercises. Do not do them again unless your doctor says it is okay. This information is not intended to replace advice given to you by your health care provider. Make sure you discuss any questions you have with your health care provider. Document Revised: 07/12/2020 Document Reviewed: 07/12/2020 Elsevier Patient Education  [place].

## 2022-03-14 DIAGNOSIS — I739 Peripheral vascular disease, unspecified: Secondary | ICD-10-CM | POA: Diagnosis not present

## 2022-03-14 DIAGNOSIS — M47816 Spondylosis without myelopathy or radiculopathy, lumbar region: Secondary | ICD-10-CM | POA: Diagnosis not present

## 2022-03-14 DIAGNOSIS — Q600 Renal agenesis, unilateral: Secondary | ICD-10-CM | POA: Diagnosis not present

## 2022-03-14 DIAGNOSIS — M1990 Unspecified osteoarthritis, unspecified site: Secondary | ICD-10-CM | POA: Diagnosis not present

## 2022-03-14 DIAGNOSIS — D696 Thrombocytopenia, unspecified: Secondary | ICD-10-CM | POA: Diagnosis not present

## 2022-03-14 DIAGNOSIS — D849 Immunodeficiency, unspecified: Secondary | ICD-10-CM | POA: Diagnosis not present

## 2022-03-14 DIAGNOSIS — E785 Hyperlipidemia, unspecified: Secondary | ICD-10-CM | POA: Diagnosis not present

## 2022-03-14 DIAGNOSIS — G459 Transient cerebral ischemic attack, unspecified: Secondary | ICD-10-CM | POA: Diagnosis not present

## 2022-03-14 DIAGNOSIS — Z950 Presence of cardiac pacemaker: Secondary | ICD-10-CM | POA: Diagnosis not present

## 2022-03-14 DIAGNOSIS — R7301 Impaired fasting glucose: Secondary | ICD-10-CM | POA: Diagnosis not present

## 2022-03-14 DIAGNOSIS — D692 Other nonthrombocytopenic purpura: Secondary | ICD-10-CM | POA: Diagnosis not present

## 2022-03-14 DIAGNOSIS — E039 Hypothyroidism, unspecified: Secondary | ICD-10-CM | POA: Diagnosis not present

## 2022-04-15 ENCOUNTER — Encounter (INDEPENDENT_AMBULATORY_CARE_PROVIDER_SITE_OTHER): Payer: Medicare HMO | Admitting: Ophthalmology

## 2022-04-15 DIAGNOSIS — H33302 Unspecified retinal break, left eye: Secondary | ICD-10-CM

## 2022-04-15 DIAGNOSIS — H43813 Vitreous degeneration, bilateral: Secondary | ICD-10-CM

## 2022-04-15 DIAGNOSIS — H35372 Puckering of macula, left eye: Secondary | ICD-10-CM

## 2022-04-15 DIAGNOSIS — H4421 Degenerative myopia, right eye: Secondary | ICD-10-CM | POA: Diagnosis not present

## 2022-05-08 ENCOUNTER — Ambulatory Visit (INDEPENDENT_AMBULATORY_CARE_PROVIDER_SITE_OTHER): Payer: Medicare HMO

## 2022-05-08 DIAGNOSIS — I442 Atrioventricular block, complete: Secondary | ICD-10-CM | POA: Diagnosis not present

## 2022-05-09 LAB — CUP PACEART REMOTE DEVICE CHECK
Battery Remaining Longevity: 52 mo
Battery Remaining Percentage: 52 %
Battery Voltage: 2.98 V
Brady Statistic AP VP Percent: 71 %
Brady Statistic AP VS Percent: 1 %
Brady Statistic AS VP Percent: 29 %
Brady Statistic AS VS Percent: 1 %
Brady Statistic RA Percent Paced: 69 %
Brady Statistic RV Percent Paced: 99 %
Date Time Interrogation Session: 20231227020014
Implantable Lead Connection Status: 753985
Implantable Lead Connection Status: 753985
Implantable Lead Implant Date: 20191226
Implantable Lead Implant Date: 20191226
Implantable Lead Location: 753859
Implantable Lead Location: 753860
Implantable Pulse Generator Implant Date: 20191226
Lead Channel Impedance Value: 400 Ohm
Lead Channel Impedance Value: 530 Ohm
Lead Channel Pacing Threshold Amplitude: 0.5 V
Lead Channel Pacing Threshold Amplitude: 0.5 V
Lead Channel Pacing Threshold Pulse Width: 0.5 ms
Lead Channel Pacing Threshold Pulse Width: 0.5 ms
Lead Channel Sensing Intrinsic Amplitude: 12 mV
Lead Channel Sensing Intrinsic Amplitude: 2.8 mV
Lead Channel Setting Pacing Amplitude: 2 V
Lead Channel Setting Pacing Amplitude: 2.5 V
Lead Channel Setting Pacing Pulse Width: 0.5 ms
Lead Channel Setting Sensing Sensitivity: 2 mV
Pulse Gen Model: 2272
Pulse Gen Serial Number: 9094310

## 2022-05-14 DIAGNOSIS — L821 Other seborrheic keratosis: Secondary | ICD-10-CM | POA: Diagnosis not present

## 2022-05-14 DIAGNOSIS — C44629 Squamous cell carcinoma of skin of left upper limb, including shoulder: Secondary | ICD-10-CM | POA: Diagnosis not present

## 2022-05-14 DIAGNOSIS — L738 Other specified follicular disorders: Secondary | ICD-10-CM | POA: Diagnosis not present

## 2022-05-14 DIAGNOSIS — L723 Sebaceous cyst: Secondary | ICD-10-CM | POA: Diagnosis not present

## 2022-05-14 DIAGNOSIS — L570 Actinic keratosis: Secondary | ICD-10-CM | POA: Diagnosis not present

## 2022-05-14 DIAGNOSIS — L820 Inflamed seborrheic keratosis: Secondary | ICD-10-CM | POA: Diagnosis not present

## 2022-05-14 DIAGNOSIS — L812 Freckles: Secondary | ICD-10-CM | POA: Diagnosis not present

## 2022-05-14 DIAGNOSIS — Z85828 Personal history of other malignant neoplasm of skin: Secondary | ICD-10-CM | POA: Diagnosis not present

## 2022-05-14 DIAGNOSIS — D485 Neoplasm of uncertain behavior of skin: Secondary | ICD-10-CM | POA: Diagnosis not present

## 2022-05-15 ENCOUNTER — Encounter: Payer: Self-pay | Admitting: Cardiology

## 2022-05-15 ENCOUNTER — Ambulatory Visit: Payer: Medicare HMO | Attending: Cardiology | Admitting: Cardiology

## 2022-05-15 VITALS — BP 128/56 | HR 67 | Ht 74.0 in | Wt 164.0 lb

## 2022-05-15 DIAGNOSIS — Z950 Presence of cardiac pacemaker: Secondary | ICD-10-CM

## 2022-05-15 DIAGNOSIS — I441 Atrioventricular block, second degree: Secondary | ICD-10-CM | POA: Diagnosis not present

## 2022-05-15 NOTE — Progress Notes (Signed)
Electrophysiology Office Note   Date:  05/15/2022   ID:  Joseph Hanna, DOB 06-01-1932, MRN [id number]  PCP:  [name], MD  Cardiologist:  Angelena Form Primary Electrophysiologist:  Lidya Mccalister Meredith Leeds, MD    Chief Complaint: pacemaker   History of Present Illness: Joseph Hanna is a 89 y.o. male who is being seen today for the evaluation of pacemaker at the request of [name], MD. Presenting today for electrophysiology evaluation.  He has a history significant for renal cancer post left nephrectomy, hyperlipidemia, hypothyroidism, TIA, heart block post Abbott dual-chamber pacemaker implanted in 2019.  Today, he denies symptoms of palpitations, chest pain, shortness of breath, orthopnea, PND, lower extremity edema, claudication, dizziness, presyncope, syncope, bleeding, or neurologic sequela. The patient is tolerating medications without difficulties.    Past Medical History:  Diagnosis Date   Arthritis    BPH (benign prostatic hypertrophy)    Cancer (HCC)    kidney , skin cancer    CKD (chronic kidney disease)    Clavicle fracture    Right   ED (erectile dysfunction) of organic origin    Fatty liver 2020   [place]   Gallstones 2020   [place]   GERD (gastroesophageal reflux disease)    Glaucoma    bilateral --  right uses rx drops and left eye trabeculectomy 2011   Grade I diastolic dysfunction 49/44/9675   Noted on ECHO   History of bradycardia    History of gastroesophageal reflux (GERD)    History of left bundle branch block (LBBB) 05/05/2018   Noted on EKG   History of renal cell cancer    S/P  LEFT NEPHRECTOMY  2003--  no recurrence   History of ulcerative colitis    in remission   Hyperlipidemia    Hypothyroidism    Keratosis, actinic    Lower leg pain    pt states has fibula-tibula syndrome--  goes to physical therapy   LVH (left ventricular hypertrophy) 04/21/2018   Mode, noted on ECHO   Mobitz II 05/05/2018   Noted on EKG   Presence of permanent cardiac  pacemaker    Rosacea    Thrombocytopenia ([place])    TIA (transient ischemic attack) 06/20/2015   TIA (transient ischemic attack)    2017    Past Surgical History:  Procedure Laterality Date   bilateral knee meniscus surgery      CATARACT EXTRACTION W/ INTRAOCULAR LENS  IMPLANT, BILATERAL     COLONOSCOPY  FEB 2015   GREEN LIGHT LASER TURP (TRANSURETHRAL RESECTION OF PROSTATE N/A 08/10/2013   Procedure: GREEN LIGHT LASER TURP (TRANSURETHRAL RESECTION OF PROSTATE;  Surgeon: [name], MD;  Location: [facility];  Service: Urology;  Laterality: N/A;   LUMBAR LAMINECTOMY  1990   MOHS SURGERY     x 2   NEPHRECTOMY Left 2003   ORIF CLAVICULAR FRACTURE  12/03/2011   Procedure: OPEN REDUCTION INTERNAL FIXATION (ORIF) CLAVICULAR FRACTURE;  Surgeon: [name], MD;  Location: [place];  Service: Orthopedics;  Laterality: Right;   PACEMAKER IMPLANT N/A 05/07/2018    St Jude Medical Assurity MRI conditional  dual-chamber pacemaker by Dr Rayann Heman for mobitz II second degree AV block   right shoulder surgery      done at [place] Left LEFT  2012   TONSILLECTOMY  as child   TRABECULECTOMY  2011   left eye (glaucoma)   TRANSURETHRAL RESECTION OF PROSTATE N/A 07/24/2018  Procedure: TRANSURETHRAL RESECTION OF THE PROSTATE (TURP);  Surgeon: Festus Aloe, MD;  Location: [facility];  Service: Urology;  Laterality: N/A;     Current Outpatient Medications  Medication Sig Dispense Refill   acetaminophen (TYLENOL) 500 MG tablet Take 500 mg by mouth 2 (two) times daily.     azaTHIOprine (IMURAN) 50 MG tablet Take 50 mg by mouth every morning.     Cholecalciferol (VITAMIN D3) 1000 UNITS CAPS Take 2 capsules by mouth daily.      clotrimazole (LOTRIMIN) 1 % external solution APPLY SMALL AMOUNT TO AFFECTED AREA TWICE A DAY APPLY TO ALL FUNGAL TOENAILS AND TOENAIL BEDS     Coenzyme Q10 (COQ10) 200 MG CAPS Take 1 capsule by mouth daily.      dorzolamide (TRUSOPT) 2 % ophthalmic solution INSTILL 1 DROP IN RIGHT EYE THREE TIMES A DAY     finasteride (PROSCAR) 5 MG tablet Take 5 mg by mouth daily.     fluorouracil (EFUDEX) 5 % cream Apply 1 Application topically as needed (skin).     gabapentin (NEURONTIN) 300 MG capsule Take 1 capsule (300 mg total) by mouth 2 (two) times daily. 60 capsule 1   Ginkgo Biloba 40 MG TABS Take 120 mg by mouth daily.      levothyroxine (SYNTHROID, LEVOTHROID) 50 MCG tablet Take 50-100 mcg by mouth daily before breakfast. Take 27mg T, Th, S,and S.  Takes 100 mcg M,W,and F.     mesalamine (APRISO) 0.375 G 24 hr capsule Take 2,250 mg by mouth daily. TAKES 6 TABS DAILY--  TOTAL '2250MG'$      mesalamine (CANASA) 1000 MG suppository Place 1,000 mg rectally at bedtime.     Multiple Vitamin (MULTIVITAMIN ADULT PO) SMARTSIG:1 By Mouth     Netarsudil-Latanoprost 0.02-0.005 % SOLN Apply 1 drop to eye at bedtime. Right eye.     niacin (SLO-NIACIN) 500 MG tablet Take 500 mg by mouth at bedtime.     RESTASIS 0.05 % ophthalmic emulsion Place 1 drop into both eyes as needed (dry eye).     simvastatin (ZOCOR) 20 MG tablet Take 10 mg by mouth daily. TAKES QHS     urea (CARMOL) 20 % cream Apply topically as needed.     No current facility-administered medications for this visit.    Allergies:   Itraconazole, Other, Atorvastatin, Beta adrenergic blockers, Irbesartan, and Terbinafine   Social History:  The patient  reports that he quit smoking about 68 years ago. His smoking use included cigarettes. He has never used smokeless tobacco. He reports current alcohol use of about 7.0 standard drinks of alcohol per week. He reports that he does not use drugs.   Family History:  The patient's family history includes Alzheimer's disease in his brother; Breast cancer in his mother; Emphysema in his father.    ROS:  Please see the history of present illness.   Otherwise, review of systems is positive for none.   All other systems  are reviewed and negative.    PHYSICAL EXAM: VS:  BP (!) 128/56   Pulse 67   Ht '6\' 2"'$  (1.88 m)   Wt 164 lb (74.4 kg)   SpO2 98%   BMI 21.06 kg/m  , BMI Body mass index is 21.06 kg/m. GEN: Well nourished, well developed, in no acute distress  HEENT: normal  Neck: no JVD, carotid bruits, or masses Cardiac: RRR; no murmurs, rubs, or gallops,no edema  Respiratory:  clear to auscultation bilaterally, normal work of breathing GI: soft, nontender, nondistended, +  BS MS: no deformity or atrophy  Skin: warm and dry, device pocket is well healed Neuro:  Strength and sensation are intact Psych: euthymic mood, full affect  EKG:  EKG is ordered today. Personal review of the ekg ordered shows AV paced  Device interrogation is reviewed today in detail.  See PaceArt for details.   Recent Labs: 01/18/2022: ALT 12; BUN 28; Creatinine 1.56; Hemoglobin 13.3; Platelet Count 49; Potassium 4.5; Sodium 142    Lipid Panel     Component Value Date/Time   CHOL 126 06/20/2015 0341   TRIG 152 (H) 06/20/2015 0341   HDL 30 (L) 06/20/2015 0341   CHOLHDL 4.2 06/20/2015 0341   VLDL 30 06/20/2015 0341   LDLCALC 66 06/20/2015 0341     Wt Readings from Last 3 Encounters:  05/15/22 164 lb (74.4 kg)  03/12/22 166 lb (75.3 kg)  01/29/22 165 lb (74.8 kg)      Other studies Reviewed: Additional studies/ records that were reviewed today include: TTE 2019  Review of the above records today demonstrates:  - Left ventricle: The cavity size was normal. Wall thickness was    increased in a pattern of moderate LVH. Systolic function was    normal. The estimated ejection fraction was in the range of 60%    to 65%. Wall motion was normal; there were no regional wall    motion abnormalities. Doppler parameters are consistent with    abnormal left ventricular relaxation (grade 1 diastolic    dysfunction). The E/e&' ratio is between 8-15, suggesting    indeterminate LV filling pressure.  - Mitral valve:  Mildly thickened leaflets . There was mild    regurgitation.  - Left atrium: The atrium was normal in size.  - Inferior vena cava: The vessel was normal in size. The    respirophasic diameter changes were in the normal range (>= 50%),    consistent with normal central venous pressure.    ASSESSMENT AND PLAN:  1.  Mobitz 2 second-degree AV block: Status post Abbott dual-chamber pacemaker implanted 05/07/2018.  Device functioning appropriately.  No changes at this time.  2.  Paroxysmal atrial fibrillation: CHA2DS2-VASc of 4.  Low burden less than 1%.  Has thrombocytopenia and thus not anticoagulated.  3.  Thrombocytopenia: Follows with hematology  Current medicines are reviewed at length with the patient today.   The patient does not have concerns regarding his medicines.  The following changes were made today:  none  Labs/ tests ordered today include:  Orders Placed This Encounter  Procedures   EKG 12-Lead     Disposition:   FU 1 year  Signed, Dion Sibal Meredith Leeds, MD  05/15/2022 11:42 AM     [facility] [address] [place] [address] [phone] (office) [phone] (fax)

## 2022-05-15 NOTE — Patient Instructions (Signed)
  Medication Instructions:  Your physician recommends that you continue on your current medications as directed. Please refer to the Current Medication list given to you today.  *If you need a refill on your cardiac medications before your next appointment, please call your pharmacy*   Lab Work: None ordered   Testing/Procedures: None ordered   Follow-Up: At [facility], you and your health needs are our priority.  As part of our continuing mission to provide you with exceptional heart care, we have created designated Provider Care Teams.  These Care Teams include your primary Cardiologist (physician) and Advanced Practice Providers (APPs -  Physician Assistants and Nurse Practitioners) who all work together to provide you with the care you need, when you need it.   Remote monitoring is used to monitor your Pacemaker or ICD from home. This monitoring reduces the number of office visits required to check your device to one time per year. It allows [place] to keep an eye on the functioning of your device to ensure it is working properly. You are scheduled for a device check from home on 08/07/2022. You may send your transmission at any time that day. If you have a wireless device, the transmission will be sent automatically. After your physician reviews your transmission, you will receive a postcard with your next transmission date.  Your next appointment:   1 year(s)  The format for your next appointment:   In Person  Provider:   Tommye Standard, PA-C    Thank you for choosing [facility]!!   Trinidad Curet, RN [phone]    Other Instructions  Important Information About Sugar

## 2022-05-28 NOTE — Progress Notes (Signed)
Remote pacemaker transmission.   

## 2022-06-11 ENCOUNTER — Encounter: Payer: Self-pay | Admitting: Physical Medicine & Rehabilitation

## 2022-06-11 ENCOUNTER — Encounter
Payer: No Typology Code available for payment source | Attending: Physical Medicine & Rehabilitation | Admitting: Physical Medicine & Rehabilitation

## 2022-06-11 VITALS — BP 150/72 | HR 61 | Ht 74.0 in | Wt 167.0 lb

## 2022-06-11 DIAGNOSIS — M48061 Spinal stenosis, lumbar region without neurogenic claudication: Secondary | ICD-10-CM | POA: Diagnosis present

## 2022-06-11 DIAGNOSIS — M5416 Radiculopathy, lumbar region: Secondary | ICD-10-CM

## 2022-06-11 MED ORDER — GABAPENTIN 300 MG PO CAPS: 300.0000 mg | ORAL_CAPSULE | Freq: Three times a day (TID) | ORAL | 1 refills | Status: DC

## 2022-06-11 NOTE — Progress Notes (Signed)
Subjective:    Patient ID: Joseph Hanna, male    DOB: 1932/11/03, 89 y.o.   MRN: [id number]  HPI 89 year old male with history of lumbar spinal stenosis.  See MRI results below potential left L2 nerve root compression L4 as well. He has undergone neurosurgical evaluation and no surgical decompression was recommended.  He has tried gabapentin 300 mg twice daily with equivocal relief thus far.  He is wondering whether this dose can be increased.  He is also wondering about other treatment options.  Patient has a permanent pacemaker. Activity related pain, denies pain at night Gabapentin '300mg'$  BID 9a and 9p  Tylenol '500mg'$  BID, discussed maximum acetaminophen dose and patient his age  MRI LUMBAR SPINE WITHOUT CONTRAST   TECHNIQUE: Multiplanar, multisequence MR imaging of the lumbar spine was performed. No intravenous contrast was administered.   COMPARISON:  None Available.   FINDINGS: Segmentation:  Standard.   Alignment: Dextrocurvature. No significant anteroposterior listhesis.   Vertebrae: Degenerative endplate irregularity. No substantial marrow edema. No suspicious osseous lesion.   Conus medullaris and cauda equina: Conus extends to the L1 level. Conus and cauda equina appear normal.   Paraspinal and other soft tissues: Unremarkable.   Disc levels:   T12-L1: Imaged in the sagittal plane only, there is an inferiorly directed central disc extrusion. No significant stenosis.   L1-L2: Disc bulge with left subarticular/foraminal protrusion and endplate osteophytic ridging. No canal stenosis. Effacement of the left subarticular recess. No right foraminal stenosis. Mild to moderate left foraminal stenosis.   L2-L3: Disc bulge with endplate osteophytic ridging. Mild facet arthropathy. No canal stenosis. Slight effacement of the left subarticular recess. No right foraminal stenosis. Mild left foraminal stenosis.   L3-L4: Disc bulge. Mild facet arthropathy. No canal  stenosis. Partial effacement of subarticular recesses. Mild foraminal stenosis.   L4-L5: Disc bulge with endplate osteophytic ridging. Mild right and moderate left facet arthropathy. No canal stenosis. Partial effacement of subarticular recesses. Mild right and mild to moderate left foraminal stenosis.   L5-S1: Disc bulge with endplate osteophytic ridging eccentric to the right. Mild facet arthropathy. No canal stenosis. Mild right foraminal stenosis. No left foraminal stenosis.   IMPRESSION: Multilevel degenerative changes as detailed above. No high-grade canal or left foraminal stenosis. Effacement of the left subarticular recess at L1-L2 with probable traversing L2 nerve root compression.     Electronically Signed   By: Macy Mis M.D.   On: 01/04/2022 11:09   Pain Inventory Average Pain 6 Pain Right Now 5 My pain is sharp, stabbing, and aching  In the last 24 hours, has pain interfered with the following? General activity 5 Relation with others 4 Enjoyment of life 9 What TIME of day is your pain at its worst? daytime Sleep (in general) Fair  Pain is worse with: walking, standing, and some activites Pain improves with: rest and medication Relief from Meds: 6  Family History  Problem Relation Age of Onset   Breast cancer Mother    Emphysema Father    Alzheimer's disease Brother    Social History   Socioeconomic History   Marital status: Married    Spouse name: Not on file   Number of children: 4   Years of education: Not on file   Highest education level: Not on file  Occupational History   Occupation: Retired-Salesman  Tobacco Use   Smoking status: Former    Years: 6.00    Types: Cigarettes    Quit date: 12/01/1953  Years since quitting: 68.5   Smokeless tobacco: Never  Vaping Use   Vaping Use: Never used  Substance and Sexual Activity   Alcohol use: Yes    Alcohol/week: 7.0 standard drinks of alcohol    Types: 7 Glasses of wine per week     Comment: glass of wine several days per week   Drug use: No   Sexual activity: Not on file  Other Topics Concern   Not on file  Social History Narrative   Not on file   Social Determinants of Health   Financial Resource Strain: Not on file  Food Insecurity: Not on file  Transportation Needs: Not on file  Physical Activity: Not on file  Stress: Not on file  Social Connections: Not on file   Past Surgical History:  Procedure Laterality Date   bilateral knee meniscus surgery      CATARACT EXTRACTION W/ INTRAOCULAR LENS  IMPLANT, BILATERAL     COLONOSCOPY  FEB 2015   GREEN LIGHT LASER TURP (TRANSURETHRAL RESECTION OF PROSTATE N/A 08/10/2013   Procedure: GREEN LIGHT LASER TURP (TRANSURETHRAL RESECTION OF PROSTATE;  Surgeon: [name], MD;  Location: [facility];  Service: Urology;  Laterality: N/A;   LUMBAR LAMINECTOMY  1990   MOHS SURGERY     x 2   NEPHRECTOMY Left 2003   ORIF CLAVICULAR FRACTURE  12/03/2011   Procedure: OPEN REDUCTION INTERNAL FIXATION (ORIF) CLAVICULAR FRACTURE;  Surgeon: [name], MD;  Location: [place];  Service: Orthopedics;  Laterality: Right;   PACEMAKER IMPLANT N/A 05/07/2018    St Jude Medical Assurity MRI conditional  dual-chamber pacemaker by Dr Rayann Heman for mobitz II second degree AV block   right shoulder surgery      done at [place] Left LEFT  2012   TONSILLECTOMY  as child   TRABECULECTOMY  2011   left eye (glaucoma)   TRANSURETHRAL RESECTION OF PROSTATE N/A 07/24/2018   Procedure: TRANSURETHRAL RESECTION OF THE PROSTATE (TURP);  Surgeon: Festus Aloe, MD;  Location: [facility];  Service: Urology;  Laterality: N/A;   Past Surgical History:  Procedure Laterality Date   bilateral knee meniscus surgery      CATARACT EXTRACTION W/ INTRAOCULAR LENS  IMPLANT, BILATERAL     COLONOSCOPY  FEB 2015   GREEN LIGHT LASER TURP (TRANSURETHRAL RESECTION OF PROSTATE N/A 08/10/2013    Procedure: GREEN LIGHT LASER TURP (TRANSURETHRAL RESECTION OF PROSTATE;  Surgeon: [name], MD;  Location: [facility];  Service: Urology;  Laterality: N/A;   LUMBAR LAMINECTOMY  1990   MOHS SURGERY     x 2   NEPHRECTOMY Left 2003   ORIF CLAVICULAR FRACTURE  12/03/2011   Procedure: OPEN REDUCTION INTERNAL FIXATION (ORIF) CLAVICULAR FRACTURE;  Surgeon: [name], MD;  Location: [place];  Service: Orthopedics;  Laterality: Right;   PACEMAKER IMPLANT N/A 05/07/2018    St Jude Medical Assurity MRI conditional  dual-chamber pacemaker by Dr Rayann Heman for mobitz II second degree AV block   right shoulder surgery      done at [place] Left LEFT  2012   TONSILLECTOMY  as child   TRABECULECTOMY  2011   left eye (glaucoma)   TRANSURETHRAL RESECTION OF PROSTATE N/A 07/24/2018   Procedure: TRANSURETHRAL RESECTION OF THE PROSTATE (TURP);  Surgeon: Festus Aloe, MD;  Location: [facility];  Service: Urology;  Laterality: N/A;   Past Medical History:  Diagnosis Date   Arthritis    BPH (benign prostatic hypertrophy)    Cancer (HCC)    kidney , skin cancer    CKD (chronic kidney disease)    Clavicle fracture    Right   ED (erectile dysfunction) of organic origin    Fatty liver 2020   [place]   Gallstones 2020   [place]   GERD (gastroesophageal reflux disease)    Glaucoma    bilateral --  right uses rx drops and left eye trabeculectomy 2011   Grade I diastolic dysfunction 09/81/1914   Noted on ECHO   History of bradycardia    History of gastroesophageal reflux (GERD)    History of left bundle branch block (LBBB) 05/05/2018   Noted on EKG   History of renal cell cancer    S/P  LEFT NEPHRECTOMY  2003--  no recurrence   History of ulcerative colitis    in remission   Hyperlipidemia    Hypothyroidism    Keratosis, actinic    Lower leg pain    pt states has fibula-tibula syndrome--  goes to physical therapy   LVH (left  ventricular hypertrophy) 04/21/2018   Mode, noted on ECHO   Mobitz II 05/05/2018   Noted on EKG   Presence of permanent cardiac pacemaker    Rosacea    Thrombocytopenia ([place])    TIA (transient ischemic attack) 06/20/2015   TIA (transient ischemic attack)    2017    Ht '6\' 2"'$  (1.88 m)   Wt 167 lb (75.8 kg)   BMI 21.44 kg/m   Opioid Risk Score:   Fall Risk Score:  `1  Depression screen [facility] 2/9     06/11/2022   11:41 AM 03/12/2022   11:42 AM 09/11/2021    9:47 AM 06/21/2021    9:39 AM 04/17/2021   12:49 PM 03/06/2021    3:07 PM 02/23/2021    1:23 PM  Depression screen PHQ 2/9  Decreased Interest 0 0 0 0 0 0 0  Down, Depressed, Hopeless 0 0 0 0 0 0 0  PHQ - 2 Score 0 0 0 0 0 0 0      Review of Systems  Musculoskeletal:  Positive for back pain and gait problem.  All other systems reviewed and are negative.     Objective:   Physical Exam        Assessment & Plan:   1.  Lumbar spinal stenosis with primary issue of radicular pain left lower extremity.  We discussed the role of gabapentin.  Is currently on 300 mg twice daily which she takes at 12-hour dosing interval.  We discussed adding another dose in between these doses.  New dose would be gabapentin 300 mg 3 times daily.  GFR is 43 maximum dose would be 1400 mg a day which would allow for some additional dosing escalation. Patient has tried physical therapy without much relief.  Left L4-5 transforaminal injection was not of any significant relief. May consider left L1-L2 transforaminal epidural steroid injection if medication changes are not effective.  Other treatment options would include spinal cord stimulator placement although given his permanent pacemaker this may not be an option  Return to clinic in 6 weeks

## 2022-06-11 NOTE — Patient Instructions (Addendum)
Take Gabapentin '300mg'$  3 times a day at 9a, 3p, 9p

## 2022-06-13 DIAGNOSIS — H35372 Puckering of macula, left eye: Secondary | ICD-10-CM | POA: Diagnosis not present

## 2022-06-13 DIAGNOSIS — H35033 Hypertensive retinopathy, bilateral: Secondary | ICD-10-CM | POA: Diagnosis not present

## 2022-06-13 DIAGNOSIS — H02401 Unspecified ptosis of right eyelid: Secondary | ICD-10-CM | POA: Diagnosis not present

## 2022-06-13 DIAGNOSIS — H16223 Keratoconjunctivitis sicca, not specified as Sjogren's, bilateral: Secondary | ICD-10-CM | POA: Diagnosis not present

## 2022-06-13 DIAGNOSIS — H401133 Primary open-angle glaucoma, bilateral, severe stage: Secondary | ICD-10-CM | POA: Diagnosis not present

## 2022-06-26 DIAGNOSIS — M5136 Other intervertebral disc degeneration, lumbar region: Secondary | ICD-10-CM | POA: Diagnosis not present

## 2022-06-26 DIAGNOSIS — M5416 Radiculopathy, lumbar region: Secondary | ICD-10-CM | POA: Diagnosis not present

## 2022-07-05 ENCOUNTER — Telehealth: Payer: Self-pay

## 2022-07-05 NOTE — Telephone Encounter (Signed)
Hi Dr. Curt Bears,  You saw this patient on 05/15/2022. Will you please comment on clearance for placement of spinal cord stimulator for (not yet scheduled).  Please route your response to P CV DIV Preop. I will communicate with requesting office once you have given recommendations.   Thank you!  Mayra Reel, NP

## 2022-07-05 NOTE — Telephone Encounter (Signed)
   Pre-operative Risk Assessment    Patient Name: Joseph Hanna  DOB: 03-Feb-1933 MRN: [id number]      Request for Surgical Clearance    Procedure:   SPINAL CORD STIMULATOR  Date of Surgery:  Clearance TBD                                 Surgeon:  DR. Deirdre Evener Surgeon's Group or Practice Name:  [facility] Phone number:  [phone] Fax number:  [phone]   Type of Clearance Requested:   - Medical    Type of Anesthesia:  Not Indicated   Additional requests/questions:   Per requesting surgeon's office stating that patient has thrombocytopenia & a very low platelet count. Needs clarification that his cardiologist is okay with the stimulator considering he has a pacemaker as well.  Signed, Jacinta Shoe   07/05/2022, 2:53 PM

## 2022-07-08 NOTE — Telephone Encounter (Addendum)
   Patient Name: Joseph Hanna  DOB: 1932/10/09 MRN: [id number]  Primary Cardiologist: Lauree Chandler, MD  Chart reviewed as part of pre-operative protocol coverage. Patient has plans for placement of spinal cord stimulator. Per Dr. Curt Bears: "Theoretically could be an interaction with spinal cord stimulator and pacemaker.  Would have pacemaker checked at the time of spinal cord stimulator implant.  Aside from that, intermediate risk for an intermediate risk procedure.  No further cardiac testing is necessary." If you need help arranging interrogation of pacemaker, please let us know.  In regards to his thrombocytopenia, patient follows with Dr. Marin Olp (Hematology-Oncology); therefore, would recommend reaching out to his office for any recommendations regarding this.   I will route this recommendation to the requesting party via Epic fax function and remove from pre-op pool.  Please call with questions.  Darreld Mclean, PA-C 07/08/2022, 5:25 PM

## 2022-07-19 ENCOUNTER — Inpatient Hospital Stay: Payer: Medicare HMO | Attending: Hematology & Oncology

## 2022-07-19 ENCOUNTER — Other Ambulatory Visit: Payer: Self-pay

## 2022-07-19 ENCOUNTER — Inpatient Hospital Stay (HOSPITAL_BASED_OUTPATIENT_CLINIC_OR_DEPARTMENT_OTHER): Payer: Medicare HMO | Admitting: Oncology

## 2022-07-19 VITALS — BP 137/50 | HR 60 | Temp 97.5°F | Resp 17

## 2022-07-19 DIAGNOSIS — M549 Dorsalgia, unspecified: Secondary | ICD-10-CM | POA: Insufficient documentation

## 2022-07-19 DIAGNOSIS — K5190 Ulcerative colitis, unspecified, without complications: Secondary | ICD-10-CM | POA: Diagnosis not present

## 2022-07-19 DIAGNOSIS — M79605 Pain in left leg: Secondary | ICD-10-CM | POA: Diagnosis not present

## 2022-07-19 DIAGNOSIS — D696 Thrombocytopenia, unspecified: Secondary | ICD-10-CM

## 2022-07-19 DIAGNOSIS — Z79899 Other long term (current) drug therapy: Secondary | ICD-10-CM | POA: Insufficient documentation

## 2022-07-19 DIAGNOSIS — D6959 Other secondary thrombocytopenia: Secondary | ICD-10-CM | POA: Diagnosis not present

## 2022-07-19 DIAGNOSIS — M79604 Pain in right leg: Secondary | ICD-10-CM | POA: Diagnosis not present

## 2022-07-19 LAB — CBC WITH DIFFERENTIAL (CANCER CENTER ONLY)
Abs Immature Granulocytes: 0.09 10*3/uL — ABNORMAL HIGH (ref 0.00–0.07)
Basophils Absolute: 0 10*3/uL (ref 0.0–0.1)
Basophils Relative: 0 %
Eosinophils Absolute: 0 10*3/uL (ref 0.0–0.5)
Eosinophils Relative: 0 %
HCT: 42.8 % (ref 39.0–52.0)
Hemoglobin: 13.3 g/dL (ref 13.0–17.0)
Immature Granulocytes: 1 %
Lymphocytes Relative: 15 %
Lymphs Abs: 1.2 10*3/uL (ref 0.7–4.0)
MCH: 30.5 pg (ref 26.0–34.0)
MCHC: 31.1 g/dL (ref 30.0–36.0)
MCV: 98.2 fL (ref 80.0–100.0)
Monocytes Absolute: 2.3 10*3/uL — ABNORMAL HIGH (ref 0.1–1.0)
Monocytes Relative: 31 %
Neutro Abs: 4 10*3/uL (ref 1.7–7.7)
Neutrophils Relative %: 53 %
Platelet Count: 46 10*3/uL — ABNORMAL LOW (ref 150–400)
RBC: 4.36 MIL/uL (ref 4.22–5.81)
RDW: 15.2 % (ref 11.5–15.5)
WBC Count: 7.6 10*3/uL (ref 4.0–10.5)
nRBC: 0 % (ref 0.0–0.2)

## 2022-07-19 LAB — CMP (CANCER CENTER ONLY)
ALT: 10 U/L (ref 0–44)
AST: 13 U/L — ABNORMAL LOW (ref 15–41)
Albumin: 4.7 g/dL (ref 3.5–5.0)
Alkaline Phosphatase: 36 U/L — ABNORMAL LOW (ref 38–126)
Anion gap: 8 (ref 5–15)
BUN: 27 mg/dL — ABNORMAL HIGH (ref 8–23)
CO2: 29 mmol/L (ref 22–32)
Calcium: 10.2 mg/dL (ref 8.9–10.3)
Chloride: 105 mmol/L (ref 98–111)
Creatinine: 1.41 mg/dL — ABNORMAL HIGH (ref 0.61–1.24)
GFR, Estimated: 48 mL/min — ABNORMAL LOW (ref >60)
Glucose, Bld: 110 mg/dL — ABNORMAL HIGH (ref 70–99)
Potassium: 4.5 mmol/L (ref 3.5–5.1)
Sodium: 142 mmol/L (ref 135–145)
Total Bilirubin: 0.6 mg/dL (ref 0.3–1.2)
Total Protein: 7.5 g/dL (ref 6.5–8.1)

## 2022-07-19 LAB — SAVE SMEAR(SSMR), FOR PROVIDER SLIDE REVIEW

## 2022-07-19 NOTE — Addendum Note (Signed)
Addended by: Maryanna Shape on: 07/19/2022 10:22 AM   Modules accepted: Orders

## 2022-07-19 NOTE — Progress Notes (Signed)
Hematology and Oncology Follow Up Visit  Joseph Hanna [id number] 1932-11-10 89 y.o. 07/19/2022   Principle Diagnosis:  Thrombocytopenia secondary to underlying liver disease and Imuran (for UC)  Current Therapy:   Observation   Interim History:  Joseph Hanna is here today for follow-up.  He continues to have pain in his back and legs.  He tells me that he has seen neurosurgery in the past and was not a candidate for any surgical intervention.  He is being seen by PM&R and is on gabapentin.  He states this is not really working very well for him.  Has had injections in the past.  Due to follow-up with them next week.  He is not having any nosebleeds, blood in his urine, blood in the stool.  Reports that he bruises very easily.  No recent flareup of his ulcerative colitis.  Remains on Imuran.  He is not having any chest pain or shortness of breath.   Overall, I would have said that his performance status is probably ECOG 2.    Medications:  Allergies as of 07/19/2022       Reactions   Itraconazole Rash   Other Rash   Atorvastatin Other (See Comments)   Other reaction(s): muscle and joint aches   Beta Adrenergic Blockers Other (See Comments)   Patient doesn't know reaction   Irbesartan Other (See Comments)   Patient doesn't know reaction    Terbinafine Rash        Medication List        Accurate as of July 19, 2022  8:57 AM. If you have any questions, ask your nurse or doctor.          acetaminophen 500 MG tablet Commonly known as: TYLENOL Take 500 mg by mouth 2 (two) times daily.   azaTHIOprine 50 MG tablet Commonly known as: IMURAN Take 50 mg by mouth every morning.   clotrimazole 1 % external solution Commonly known as: LOTRIMIN APPLY SMALL AMOUNT TO AFFECTED AREA TWICE A DAY APPLY TO ALL FUNGAL TOENAILS AND TOENAIL BEDS   CoQ10 200 MG Caps Take 1 capsule by mouth daily.   dorzolamide 2 % ophthalmic solution Commonly known as: TRUSOPT INSTILL 1 DROP IN  RIGHT EYE THREE TIMES A DAY   finasteride 5 MG tablet Commonly known as: PROSCAR Take 5 mg by mouth daily.   fluorouracil 5 % cream Commonly known as: EFUDEX Apply 1 Application topically as needed (skin).   gabapentin 300 MG capsule Commonly known as: NEURONTIN Take 1 capsule (300 mg total) by mouth 3 (three) times daily.   Ginkgo Biloba 40 MG Tabs Take 120 mg by mouth daily.   levothyroxine 50 MCG tablet Commonly known as: SYNTHROID Take 50-100 mcg by mouth daily before breakfast. Take 40mg T, Th, S,and S.  Takes 100 mcg M,W,and F.   mesalamine 0.375 g 24 hr capsule Commonly known as: APRISO Take 2,250 mg by mouth daily. TAKES 6 TABS DAILY--  TOTAL '2250MG'$    mesalamine 1000 MG suppository Commonly known as: CANASA Place 1,000 mg rectally at bedtime.   MULTIVITAMIN ADULT PO SMARTSIG:1 By Mouth   Netarsudil-Latanoprost 0.02-0.005 % Soln Apply 1 drop to eye at bedtime. Right eye.   niacin 500 MG tablet Commonly known as: (VITAMIN B3) Take 500 mg by mouth at bedtime.   Restasis 0.05 % ophthalmic emulsion Generic drug: cycloSPORINE Place 1 drop into both eyes as needed (dry eye).   simvastatin 20 MG tablet Commonly known as: ZOCOR Take 10  mg by mouth daily. TAKES QHS   urea 20 % cream Commonly known as: CARMOL Apply topically as needed.   Vitamin D3 25 MCG (1000 UT) capsule Generic drug: Cholecalciferol Take 2 capsules by mouth daily.        Allergies:  Allergies  Allergen Reactions   Itraconazole Rash   Other Rash   Atorvastatin Other (See Comments)    Other reaction(s): muscle and joint aches   Beta Adrenergic Blockers Other (See Comments)    Patient doesn't know reaction   Irbesartan Other (See Comments)    Patient doesn't know reaction    Terbinafine Rash    Past Medical History, Surgical history, Social history, and Family History were reviewed and updated.  Review of Systems: Review of Systems  Constitutional: Negative.   HENT:  Negative.    Eyes: Negative.   Respiratory: Negative.    Cardiovascular: Negative.   Gastrointestinal: Negative.   Genitourinary: Negative.   Musculoskeletal:  Positive for back pain.  Skin: Negative.   Neurological: Negative.   Endo/Heme/Allergies:  Bruises/bleeds easily.  Psychiatric/Behavioral: Negative.      Physical Exam:  vitals were not taken for this visit.   Wt Readings from Last 3 Encounters:  06/11/22 75.8 kg  05/15/22 74.4 kg  03/12/22 75.3 kg    Physical Exam Vitals reviewed.  HENT:     Head: Normocephalic and atraumatic.  Eyes:     Pupils: Pupils are equal, round, and reactive to light.  Pulmonary:     Effort: Pulmonary effort is normal. No respiratory distress.  Musculoskeletal:        General: No deformity. Normal range of motion.  Lymphadenopathy:     Cervical: No cervical adenopathy.  Skin:    General: Skin is warm and dry.     Findings: Bruising present. No erythema or rash.  Neurological:     Mental Status: He is alert and oriented to person, place, and time.  Psychiatric:        Behavior: Behavior normal.        Thought Content: Thought content normal.        Judgment: Judgment normal.    Lab Results  Component Value Date   WBC 6.9 01/18/2022   HGB 13.3 01/18/2022   HCT 42.6 01/18/2022   MCV 97.9 01/18/2022   PLT 49 (L) 01/18/2022   Lab Results  Component Value Date   FERRITIN 70 05/25/2019   IRON 96 05/25/2019   TIBC 308 05/25/2019   UIBC 212 05/25/2019   IRONPCTSAT 31 05/25/2019   Lab Results  Component Value Date   RBC 4.35 01/18/2022   No results found for: "KPAFRELGTCHN", "LAMBDASER", "KAPLAMBRATIO" No results found for: "IGGSERUM", "IGA", "IGMSERUM" No results found for: "TOTALPROTELP", "ALBUMINELP", "A1GS", "A2GS", "BETS", "BETA2SER", "GAMS", "MSPIKE", "SPEI"   Chemistry      Component Value Date/Time   NA 142 01/18/2022 0928   K 4.5 01/18/2022 0928   CL 108 01/18/2022 0928   CO2 28 01/18/2022 0928   BUN 28 (H)  01/18/2022 0928   CREATININE 1.56 (H) 01/18/2022 0928      Component Value Date/Time   CALCIUM 10.0 01/18/2022 0928   ALKPHOS 37 (L) 01/18/2022 0928   AST 15 01/18/2022 0928   ALT 12 01/18/2022 0928   BILITOT 0.7 01/18/2022 0928       Impression and Plan: Joseph Hanna is a very pleasant 89 yo caucasian gentleman with history of thrombocytopenia since at least February 2017.   He is currently on observation  for thrombocytopenia.  Platelet count has been stable.  No bleeding.  Reason for thrombocytopenia not indicated at this time.  His biggest issue seems to be back and leg pain.  Per his report, he is not a surgical candidate.  I have explained to him that if surgery is ever a consideration, we could optimize his thrombocytopenia.  Could consider Nplate and possibly steroids.  We can also transfuse around the time of surgery.  Follow-up will be in 6 months.  I advised him to call us sooner if he develops any bleeding or if any surgical procedures are planned and we can certainly see him sooner.  Mikey Bussing, NP 3/8/20248:57 AM

## 2022-07-23 ENCOUNTER — Encounter
Payer: No Typology Code available for payment source | Attending: Physical Medicine & Rehabilitation | Admitting: Physical Medicine & Rehabilitation

## 2022-07-23 ENCOUNTER — Encounter: Payer: Self-pay | Admitting: Physical Medicine & Rehabilitation

## 2022-07-23 VITALS — BP 161/82 | HR 61 | Ht 74.0 in | Wt 170.0 lb

## 2022-07-23 DIAGNOSIS — M48062 Spinal stenosis, lumbar region with neurogenic claudication: Secondary | ICD-10-CM

## 2022-07-23 MED ORDER — GABAPENTIN 400 MG PO CAPS: 400.0000 mg | ORAL_CAPSULE | Freq: Three times a day (TID) | ORAL | 2 refills | Status: DC

## 2022-07-23 NOTE — Patient Instructions (Signed)
Gabapentin Capsules or Tablets ?What is this medication? ?GABAPENTIN (GA ba pen tin) treats nerve pain. It may also be used to prevent and control seizures in people with epilepsy. It works by calming overactive nerves in your body. ?This medicine may be used for other purposes; ask your health care provider or pharmacist if you have questions. ?COMMON BRAND NAME(S): Active-PAC with Gabapentin, Gabarone, Gralise, Neurontin ?What should I tell my care team before I take this medication? ?They need to know if you have any of these conditions: ?Alcohol or substance use disorder ?Kidney disease ?Lung or breathing disease ?Suicidal thoughts, plans, or attempt; a previous suicide attempt by you or a family member ?An unusual or allergic reaction to gabapentin, other medications, foods, dyes, or preservatives ?Pregnant or trying to get pregnant ?Breast-feeding ?How should I use this medication? ?Take this medication by mouth with a glass of water. Follow the directions on the prescription label. You can take it with or without food. If it upsets your stomach, take it with food. Take your medication at regular intervals. Do not take it more often than directed. Do not stop taking except on your care team's advice. ?If you are directed to break the 600 or 800 mg tablets in half as part of your dose, the extra half tablet should be used for the next dose. If you have not used the extra half tablet within 28 days, it should be thrown away. ?A special MedGuide will be given to you by the pharmacist with each prescription and refill. Be sure to read this information carefully each time. ?Talk to your care team about the use of this medication in children. While this medication may be prescribed for children as young as 3 years for selected conditions, precautions do apply. ?Overdosage: If you think you have taken too much of this medicine contact a poison control center or emergency room at once. ?NOTE: This medicine is only for  you. Do not share this medicine with others. ?What if I miss a dose? ?If you miss a dose, take it as soon as you can. If it is almost time for your next dose, take only that dose. Do not take double or extra doses. ?What may interact with this medication? ?Alcohol ?Antihistamines for allergy, cough, and cold ?Certain medications for anxiety or sleep ?Certain medications for depression like amitriptyline, fluoxetine, sertraline ?Certain medications for seizures like phenobarbital, primidone ?Certain medications for stomach problems ?General anesthetics like halothane, isoflurane, methoxyflurane, propofol ?Local anesthetics like lidocaine, pramoxine, tetracaine ?Medications that relax muscles for surgery ?Opioid medications for pain ?Phenothiazines like chlorpromazine, mesoridazine, prochlorperazine, thioridazine ?This list may not describe all possible interactions. Give your health care provider a list of all the medicines, herbs, non-prescription drugs, or dietary supplements you use. Also tell them if you smoke, drink alcohol, or use illegal drugs. Some items may interact with your medicine. ?What should I watch for while using this medication? ?Visit your care team for regular checks on your progress. You may want to keep a record at home of how you feel your condition is responding to treatment. You may want to share this information with your care team at each visit. You should contact your care team if your seizures get worse or if you have any new types of seizures. Do not stop taking this medication or any of your seizure medications unless instructed by your care team. Stopping your medication suddenly can increase your seizures or their severity. ?This medication may cause serious skin   reactions. They can happen weeks to months after starting the medication. Contact your care team right away if you notice fevers or flu-like symptoms with a rash. The rash may be red or purple and then turn into blisters or  peeling of the skin. Or, you might notice a red rash with swelling of the face, lips or lymph nodes in your neck or under your arms. ?Wear a medical identification bracelet or chain if you are taking this medication for seizures. Carry a card that lists all your medications. ?This medication may affect your coordination, reaction time, or judgment. Do not drive or operate machinery until you know how this medication affects you. Sit up or stand slowly to reduce the risk of dizzy or fainting spells. Drinking alcohol with this medication can increase the risk of these side effects. ?Your mouth may get dry. Chewing sugarless gum or sucking hard candy, and drinking plenty of water may help. ?Watch for new or worsening thoughts of suicide or depression. This includes sudden changes in mood, behaviors, or thoughts. These changes can happen at any time but are more common in the beginning of treatment or after a change in dose. Call your care team right away if you experience these thoughts or worsening depression. ?If you become pregnant while using this medication, you may enroll in the [place] Antiepileptic Drug Pregnancy Registry by calling 1-888-233-2334. This registry collects information about the safety of antiepileptic medication use during pregnancy. ?What side effects may I notice from receiving this medication? ?Side effects that you should report to your care team as soon as possible: ?Allergic reactions or angioedema--skin rash, itching, hives, swelling of the face, eyes, lips, tongue, arms, or legs, trouble swallowing or breathing ?Rash, fever, and swollen lymph nodes ?Thoughts of suicide or self harm, worsening mood, feelings of depression ?Trouble breathing ?Unusual changes in mood or behavior in children after use such as difficulty concentrating, hostility, or restlessness ?Side effects that usually do not require medical attention (report to your care team if they continue or are  bothersome): ?Dizziness ?Drowsiness ?Nausea ?Swelling of ankles, feet, or hands ?Vomiting ?This list may not describe all possible side effects. Call your doctor for medical advice about side effects. You may report side effects to FDA at 1-800-FDA-1088. ?Where should I keep my medication? ?Keep out of reach of children and pets. ?Store at room temperature between 15 and 30 degrees C (59 and 86 degrees F). Get rid of any unused medication after the expiration date. ?This medication may cause accidental overdose and death if taken by other adults, children, or pets. ?To get rid of medications that are no longer needed or have expired: ?Take the medication to a medication take-back program. Check with your pharmacy or law enforcement to find a location. ?If you cannot return the medication, check the label or package insert to see if the medication should be thrown out in the garbage or flushed down the toilet. If you are not sure, ask your care team. If it is safe to put it in the trash, empty the medication out of the container. Mix the medication with cat litter, dirt, coffee grounds, or other unwanted substance. Seal the mixture in a bag or container. Put it in the trash. ?NOTE: This sheet is a summary. It may not cover all possible information. If you have questions about this medicine, talk to your doctor, pharmacist, or health care provider. ?? 2023 Elsevier/Gold Standard (2020-10-30 00:00:00) ? ?

## 2022-07-23 NOTE — Progress Notes (Signed)
Subjective:    Patient ID: Joseph Hanna, male    DOB: [birth year], 89 y.o.   MRN: [id number]  HPI  89 year old male with history of lumbar spinal stenosis he has both low back pain as well as lower extremity pain.  He has some left hip lower extremity pain but more so below the knees in the calf area.  He denies any skin changes in the lower extremities he does have nail fungus in his toenails. He is on gabapentin for pain shooting into his legs he is on 300 mg 3 times daily without drowsiness  Seen by NS recommended SCS was scheduled however consultation was canceled because of his thrombocytopenia.  His most recent platelet count was around 40,000.  He spoke to his hematologist who stated that the patient could receive platelet transfusion prior to procedure.  Patient is not sure he wants to go through all of that. Pain Inventory Average Pain 5 Pain Right Now 6 My pain is dull, stabbing, and aching  In the last 24 hours, has pain interfered with the following? General activity 6 Relation with others 5 Enjoyment of life 7 What TIME of day is your pain at its worst? daytime Sleep (in general) Fair  Pain is worse with: walking, standing, and some activites Pain improves with: medication Relief from Meds: 4  Family History  Problem Relation Age of Onset   Breast cancer Mother    Emphysema Father    Alzheimer's disease Brother    Social History   Socioeconomic History   Marital status: Married    Spouse name: Not on file   Number of children: 4   Years of education: Not on file   Highest education level: Not on file  Occupational History   Occupation: Retired-Salesman  Tobacco Use   Smoking status: Former    Years: 6.00    Types: Cigarettes    Quit date: 12/01/1953    Years since quitting: 68.6   Smokeless tobacco: Never  Vaping Use   Vaping Use: Never used  Substance and Sexual Activity   Alcohol use: Yes    Alcohol/week: 7.0 standard drinks of alcohol    Types:  7 Glasses of wine per week    Comment: glass of wine several days per week   Drug use: No   Sexual activity: Not on file  Other Topics Concern   Not on file  Social History Narrative   Not on file   Social Determinants of Health   Financial Resource Strain: Not on file  Food Insecurity: Not on file  Transportation Needs: Not on file  Physical Activity: Not on file  Stress: Not on file  Social Connections: Not on file   Past Surgical History:  Procedure Laterality Date   bilateral knee meniscus surgery      CATARACT EXTRACTION W/ INTRAOCULAR LENS  IMPLANT, BILATERAL     COLONOSCOPY  FEB 2015   GREEN LIGHT LASER TURP (TRANSURETHRAL RESECTION OF PROSTATE N/A 08/10/2013   Procedure: GREEN LIGHT LASER TURP (TRANSURETHRAL RESECTION OF PROSTATE;  Surgeon: [name], MD;  Location: [facility];  Service: Urology;  Laterality: N/A;   LUMBAR LAMINECTOMY  1990   MOHS SURGERY     x 2   NEPHRECTOMY Left 2003   ORIF CLAVICULAR FRACTURE  12/03/2011   Procedure: OPEN REDUCTION INTERNAL FIXATION (ORIF) CLAVICULAR FRACTURE;  Surgeon: [name], MD;  Location: [place];  Service: Orthopedics;  Laterality: Right;  PACEMAKER IMPLANT N/A 05/07/2018    [employer] MRI conditional  dual-chamber pacemaker by Dr Rayann Heman for mobitz II second degree AV block   right shoulder surgery      done at [facility] Left LEFT  2012   TONSILLECTOMY  as child   TRABECULECTOMY  2011   left eye (glaucoma)   TRANSURETHRAL RESECTION OF PROSTATE N/A 07/24/2018   Procedure: TRANSURETHRAL RESECTION OF THE PROSTATE (TURP);  Surgeon: Festus Aloe, MD;  Location: [facility];  Service: Urology;  Laterality: N/A;   Past Surgical History:  Procedure Laterality Date   bilateral knee meniscus surgery      CATARACT EXTRACTION W/ INTRAOCULAR LENS  IMPLANT, BILATERAL     COLONOSCOPY  FEB 2015   GREEN LIGHT LASER TURP (TRANSURETHRAL RESECTION  OF PROSTATE N/A 08/10/2013   Procedure: GREEN LIGHT LASER TURP (TRANSURETHRAL RESECTION OF PROSTATE;  Surgeon: [name], MD;  Location: [facility];  Service: Urology;  Laterality: N/A;   LUMBAR LAMINECTOMY  1990   MOHS SURGERY     x 2   NEPHRECTOMY Left 2003   ORIF CLAVICULAR FRACTURE  12/03/2011   Procedure: OPEN REDUCTION INTERNAL FIXATION (ORIF) CLAVICULAR FRACTURE;  Surgeon: [name], MD;  Location: [place];  Service: Orthopedics;  Laterality: Right;   PACEMAKER IMPLANT N/A 05/07/2018    [employer] Medical Assurity MRI conditional  dual-chamber pacemaker by Dr Rayann Heman for mobitz II second degree AV block   right shoulder surgery      done at [place] Left LEFT  2012   TONSILLECTOMY  as child   TRABECULECTOMY  2011   left eye (glaucoma)   TRANSURETHRAL RESECTION OF PROSTATE N/A 07/24/2018   Procedure: TRANSURETHRAL RESECTION OF THE PROSTATE (TURP);  Surgeon: Festus Aloe, MD;  Location: [facility];  Service: Urology;  Laterality: N/A;   Past Medical History:  Diagnosis Date   Arthritis    BPH (benign prostatic hypertrophy)    Cancer (HCC)    kidney , skin cancer    CKD (chronic kidney disease)    Clavicle fracture    Right   ED (erectile dysfunction) of organic origin    Fatty liver 2020   [place]   Gallstones 2020   [place]   GERD (gastroesophageal reflux disease)    Glaucoma    bilateral --  right uses rx drops and left eye trabeculectomy 2011   Grade I diastolic dysfunction [id number]   Noted on ECHO   History of bradycardia    History of gastroesophageal reflux (GERD)    History of left bundle branch block (LBBB) 05/05/2018   Noted on EKG   History of renal cell cancer    S/P  LEFT NEPHRECTOMY  2003--  no recurrence   History of ulcerative colitis    in remission   Hyperlipidemia    Hypothyroidism    Keratosis, actinic    Lower leg pain    pt states has fibula-tibula syndrome--  goes to physical  therapy   LVH (left ventricular hypertrophy) 04/21/2018   Mode, noted on ECHO   Mobitz II 05/05/2018   Noted on EKG   Presence of permanent cardiac pacemaker    Rosacea    Thrombocytopenia ([place])    TIA (transient ischemic attack) 06/20/2015   TIA (transient ischemic attack)    2017    BP (!) 161/82   Pulse 61   Ht '6\' 2"'$  (1.88 m)  Wt 170 lb (77.1 kg) Comment: per patient  SpO2 98%   BMI 21.83 kg/m   Opioid Risk Score:   Fall Risk Score:  `1  Depression screen [facility] 2/9     06/11/2022   11:41 AM 03/12/2022   11:42 AM 09/11/2021    9:47 AM 06/21/2021    9:39 AM 04/17/2021   12:49 PM 03/06/2021    3:07 PM 02/23/2021    1:23 PM  Depression screen [facility] 2/9  Decreased Interest 0 0 0 0 0 0 0  Down, Depressed, Hopeless 0 0 0 0 0 0 0  PHQ - 2 Score 0 0 0 0 0 0 0     Review of Systems  Musculoskeletal:  Positive for back pain.       RT leg  All other systems reviewed and are negative.      Objective:   Physical Exam  General no acute distress Mood and affect appropriate Extremities without edema. Dorsalis pedis pulses 2+ bilaterally posterior tibialis pulses 2+ bilaterally. Feet have no dysvascular changes there is evidence of onychomycosis. Negative straight leg raising test bilaterally Strength is 5/5 bilateral hip flexor knee extensor ankle dorsiflexor Ambulates without assistive device no evidence of toe drag or knee instability Lumbar spine has no tenderness palpation in the lumbar paraspinal areas. No evidence of knee effusion no calf tenderness or swelling     Assessment & Plan:   1.  Lumbar spinal stenosis with neurogenic claudication his claudication symptoms are mostly lower leg, he was considering spinal cord stimulation however his thrombocytopenia would require platelet transfusion prior to procedure. At this point will increase gabapentin to 400 mg 3 times daily we discussed that this can be slowly increased over time with a maximum dose of 800 mg 3 times  daily. I will see him back in 4 to 6 weeks.  I reviewed his MRI lumbar spine report as well as actual images. Prior left L4-5 transforaminal epidural steroid injections in 2023 performed on 2 occasions only produced 10 to 14-day relief would not repeat.

## 2022-07-24 ENCOUNTER — Telehealth: Payer: Self-pay | Admitting: *Deleted

## 2022-07-24 NOTE — Telephone Encounter (Signed)
Joseph Hanna [id number] calling from [facility] about Gabapentin rx sent on the patient. Concerned about high dose b/c of patient's creatinine 1.41 and creatinine clearance 39.3. The recommendation is 900 mg /per day and current rx is 1200 mg per day if he takes tid. Please advise

## 2022-07-26 NOTE — Telephone Encounter (Signed)
Returned call to [place] and confirmed  Gabapentin TID per Dr. Letta Pate. Nothing further needed.

## 2022-08-07 ENCOUNTER — Ambulatory Visit (INDEPENDENT_AMBULATORY_CARE_PROVIDER_SITE_OTHER): Payer: Medicare HMO

## 2022-08-07 DIAGNOSIS — I441 Atrioventricular block, second degree: Secondary | ICD-10-CM

## 2022-08-07 LAB — CUP PACEART REMOTE DEVICE CHECK
Battery Remaining Longevity: 55 mo
Battery Remaining Percentage: 49 %
Battery Voltage: 2.98 V
Brady Statistic AP VP Percent: 71 %
Brady Statistic AP VS Percent: 1 %
Brady Statistic AS VP Percent: 29 %
Brady Statistic AS VS Percent: 1 %
Brady Statistic RA Percent Paced: 69 %
Brady Statistic RV Percent Paced: 99 %
Date Time Interrogation Session: 20240327020014
Implantable Lead Connection Status: 753985
Implantable Lead Connection Status: 753985
Implantable Lead Implant Date: 20191226
Implantable Lead Implant Date: 20191226
Implantable Lead Location: 753859
Implantable Lead Location: 753860
Implantable Pulse Generator Implant Date: 20191226
Lead Channel Impedance Value: 440 Ohm
Lead Channel Impedance Value: 510 Ohm
Lead Channel Pacing Threshold Amplitude: 0.5 V
Lead Channel Pacing Threshold Amplitude: 0.5 V
Lead Channel Pacing Threshold Pulse Width: 0.5 ms
Lead Channel Pacing Threshold Pulse Width: 0.5 ms
Lead Channel Sensing Intrinsic Amplitude: 12 mV
Lead Channel Sensing Intrinsic Amplitude: 3 mV
Lead Channel Setting Pacing Amplitude: 0.75 V
Lead Channel Setting Pacing Amplitude: 2 V
Lead Channel Setting Pacing Pulse Width: 0.5 ms
Lead Channel Setting Sensing Sensitivity: 2 mV
Pulse Gen Model: 2272
Pulse Gen Serial Number: 9094310

## 2022-08-27 DIAGNOSIS — Z85828 Personal history of other malignant neoplasm of skin: Secondary | ICD-10-CM | POA: Diagnosis not present

## 2022-08-27 DIAGNOSIS — L570 Actinic keratosis: Secondary | ICD-10-CM | POA: Diagnosis not present

## 2022-08-27 DIAGNOSIS — L821 Other seborrheic keratosis: Secondary | ICD-10-CM | POA: Diagnosis not present

## 2022-08-27 DIAGNOSIS — L603 Nail dystrophy: Secondary | ICD-10-CM | POA: Diagnosis not present

## 2022-08-27 DIAGNOSIS — L820 Inflamed seborrheic keratosis: Secondary | ICD-10-CM | POA: Diagnosis not present

## 2022-08-27 DIAGNOSIS — M6740 Ganglion, unspecified site: Secondary | ICD-10-CM | POA: Diagnosis not present

## 2022-09-03 ENCOUNTER — Encounter
Payer: No Typology Code available for payment source | Attending: Physical Medicine & Rehabilitation | Admitting: Physical Medicine & Rehabilitation

## 2022-09-03 ENCOUNTER — Encounter: Payer: Self-pay | Admitting: Physical Medicine & Rehabilitation

## 2022-09-03 VITALS — BP 168/77 | HR 61 | Ht 74.0 in | Wt 170.0 lb

## 2022-09-03 DIAGNOSIS — M48062 Spinal stenosis, lumbar region with neurogenic claudication: Secondary | ICD-10-CM | POA: Insufficient documentation

## 2022-09-03 NOTE — Patient Instructions (Addendum)
Reduce gabapentin to twice a day for one week , then at night only for 1 week then stop.  Acupuncture Acupuncture is a type of treatment that involves stimulating specific points on your body by inserting thin needles through your skin. Acupuncture is often used to treat pain, but it may also be used to help relieve other types of symptoms. Your health care provider may recommend acupuncture to help treat various conditions, such as: Migraine and tension headaches. Nausea and vomiting after a surgery or cancer treatment. Sudden or severe (acute) pain, or long-term (chronic) pain. Addiction. Acupuncture is based on traditional [place] medicine, which recognizes more than 2,000 points on the body that connect energy pathways (meridians) through the body. The goal in stimulating these points is to balance the physical, emotional, and mental energy in your body. Acupuncture is done by a health care provider who has specialized training (licensed acupuncture practitioner). Treatment often requires several acupuncture sessions. You may have acupuncture along with other medical treatments. Tell a health care provider about: Any allergies you have. All medicines you are taking, including vitamins, herbs, eye drops, creams, and over-the-counter medicines. Any blood disorders you have. Any surgeries you have had. Any medical conditions you have. Whether you are pregnant or may be pregnant. What are the risks? Generally, this is a safe treatment. However, problems may occur, including: Skin infection. Damage to organs or structures that are under the skin if a needle is placed too deeply. This is rare. What happens before the treatment? Your acupuncture practitioner will ask about your medical history and your symptoms. You may have a physical exam. What happens during the treatment? The exact procedure will depend on your condition and how your acupuncture provider treats it. In general: Your skin  will be cleaned with a germ-killing (antiseptic) solution. Your acupuncture practitioner will open a new set of germ-free (sterile) needles. The needles will be gently inserted into your skin. They will be left in place for a certain amount of time. You may feel a tingling or burning sensation for a very short period of time. Your acupuncture practitioner may: Apply electrical energy to the needles. Adjust the needles in certain ways. After your procedure, the acupuncture practitioner will remove the needles, throw them away, and clean your skin. The procedure may vary among health care providers. What can I expect after the treatment? People react differently to acupuncture. Make sure you ask your acupuncture provider what to expect after your treatment. It is common to have: Minor bruising. Mild pain. A small amount of bleeding. Follow these instructions at home: Follow any instructions given by your provider after the treatment. Keep all follow-up visits. This is important. Where to find more information [facility]: [email or web address] Contact a health care provider if: You have questions about your reaction to the treatment. You have soreness. You have skin irritation or redness. You have a fever. Summary Acupuncture is a type of treatment that involves stimulating specific points on your body by inserting thin needles through your skin. This treatment is often used to treat pain, but it may also be used to help relieve other types of symptoms. The exact procedure will depend on your condition and how your acupuncture provider treats it. This information is not intended to replace advice given to you by your health care provider. Make sure you discuss any questions you have with your health care provider. Document Revised: 01/03/2021 Document Reviewed: 01/03/2021 Elsevier Patient Education  2023  [employer].

## 2022-09-03 NOTE — Progress Notes (Addendum)
Subjective:    Patient ID: Joseph Hanna, male    DOB: 02-10-1933, 89 y.o.   MRN: [id number]  HPI 89 year old male with lumbar spondylosis as well as lumbar spinal stenosis who returns today with a primary complaint of leg pain.  His leg pains had a 6 out of 10 level.  The gabapentin dose was increased last visit from 300 mg 3 times daily to 400 mg 3 times daily.  He did not notice any additional benefit and instead noticed worsening of his memory and cognition.  Yes if there were any alternative medications and we discussed that they will have the potential for negative cognitive effects as well as may not work any better for his neuropathic leg pain.  Because of thrombocytopenia concerns spinal cord stimulator trial was deferred.  His hematologist did indicate that they can give platelet transfusion preprocedure. Left lower extremity pain and weakness going down step  Still exercising 3 times a week.  Help relieve pain for 3-4 hours  Pain Inventory Average Pain 6 Pain Right Now 6 My pain is intermittent, sharp, stabbing, and aching  In the last 24 hours, has pain interfered with the following? General activity 6 Relation with others 5 Enjoyment of life 7 What TIME of day is your pain at its worst? evening Sleep (in general) Fair  Pain is worse with: walking and standing Pain improves with: medication Relief from Meds: 5  Family History  Problem Relation Age of Onset   Breast cancer Mother    Emphysema Father    Alzheimer's disease Brother    Social History   Socioeconomic History   Marital status: Married    Spouse name: Not on file   Number of children: 4   Years of education: Not on file   Highest education level: Not on file  Occupational History   Occupation: Retired-Salesman  Tobacco Use   Smoking status: Former    Years: 6    Types: Cigarettes    Quit date: 12/01/1953    Years since quitting: 68.8   Smokeless tobacco: Never  Vaping Use   Vaping Use: Never  used  Substance and Sexual Activity   Alcohol use: Yes    Alcohol/week: 7.0 standard drinks of alcohol    Types: 7 Glasses of wine per week    Comment: glass of wine several days per week   Drug use: No   Sexual activity: Not on file  Other Topics Concern   Not on file  Social History Narrative   Not on file   Social Determinants of Health   Financial Resource Strain: Not on file  Food Insecurity: Not on file  Transportation Needs: Not on file  Physical Activity: Not on file  Stress: Not on file  Social Connections: Not on file   Past Surgical History:  Procedure Laterality Date   bilateral knee meniscus surgery      CATARACT EXTRACTION W/ INTRAOCULAR LENS  IMPLANT, BILATERAL     COLONOSCOPY  FEB 2015   GREEN LIGHT LASER TURP (TRANSURETHRAL RESECTION OF PROSTATE N/A 08/10/2013   Procedure: GREEN LIGHT LASER TURP (TRANSURETHRAL RESECTION OF PROSTATE;  Surgeon: Antony Haste, MD;  Location: [facility];  Service: Urology;  Laterality: N/A;   LUMBAR LAMINECTOMY  1990   MOHS SURGERY     x 2   NEPHRECTOMY Left 2003   ORIF CLAVICULAR FRACTURE  12/03/2011   Procedure: OPEN REDUCTION INTERNAL FIXATION (ORIF) CLAVICULAR FRACTURE;  Surgeon: Mable Paris,  MD;  Location: [facility];  Service: Orthopedics;  Laterality: Right;   PACEMAKER IMPLANT N/A 05/07/2018    St Jude Medical Assurity MRI conditional  dual-chamber pacemaker by Dr Johney Frame for mobitz II second degree AV block   right shoulder surgery      done at [facility]    SHOULDER ARTHROSCOPY Left LEFT  2012   TONSILLECTOMY  as child   TRABECULECTOMY  2011   left eye (glaucoma)   TRANSURETHRAL RESECTION OF PROSTATE N/A 07/24/2018   Procedure: TRANSURETHRAL RESECTION OF THE PROSTATE (TURP);  Surgeon: Jerilee Field, MD;  Location: [facility];  Service: Urology;  Laterality: N/A;   Past Surgical History:  Procedure Laterality Date   bilateral knee meniscus surgery      CATARACT EXTRACTION  W/ INTRAOCULAR LENS  IMPLANT, BILATERAL     COLONOSCOPY  FEB 2015   GREEN LIGHT LASER TURP (TRANSURETHRAL RESECTION OF PROSTATE N/A 08/10/2013   Procedure: GREEN LIGHT LASER TURP (TRANSURETHRAL RESECTION OF PROSTATE;  Surgeon: Antony Haste, MD;  Location: [facility];  Service: Urology;  Laterality: N/A;   LUMBAR LAMINECTOMY  1990   MOHS SURGERY     x 2   NEPHRECTOMY Left 2003   ORIF CLAVICULAR FRACTURE  12/03/2011   Procedure: OPEN REDUCTION INTERNAL FIXATION (ORIF) CLAVICULAR FRACTURE;  Surgeon: Mable Paris, MD;  Location: [facility];  Service: Orthopedics;  Laterality: Right;   PACEMAKER IMPLANT N/A 05/07/2018    St Jude Medical Assurity MRI conditional  dual-chamber pacemaker by Dr Johney Frame for mobitz II second degree AV block   right shoulder surgery      done at [facility]    SHOULDER ARTHROSCOPY Left LEFT  2012   TONSILLECTOMY  as child   TRABECULECTOMY  2011   left eye (glaucoma)   TRANSURETHRAL RESECTION OF PROSTATE N/A 07/24/2018   Procedure: TRANSURETHRAL RESECTION OF THE PROSTATE (TURP);  Surgeon: Jerilee Field, MD;  Location: [facility];  Service: Urology;  Laterality: N/A;   Past Medical History:  Diagnosis Date   Arthritis    BPH (benign prostatic hypertrophy)    Cancer (HCC)    kidney , skin cancer    CKD (chronic kidney disease)    Clavicle fracture    Right   ED (erectile dysfunction) of organic origin    Fatty liver 2020   [place]   Gallstones 2020   [place]   GERD (gastroesophageal reflux disease)    Glaucoma    bilateral --  right uses rx drops and left eye trabeculectomy 2011   Grade I diastolic dysfunction 04/21/2018   Noted on ECHO   History of bradycardia    History of gastroesophageal reflux (GERD)    History of left bundle branch block (LBBB) 05/05/2018   Noted on EKG   History of renal cell cancer    S/P  LEFT NEPHRECTOMY  2003--  no recurrence   History of ulcerative colitis    in remission   Hyperlipidemia     Hypothyroidism    Keratosis, actinic    Lower leg pain    pt states has fibula-tibula syndrome--  goes to physical therapy   LVH (left ventricular hypertrophy) 04/21/2018   Mode, noted on ECHO   Mobitz II 05/05/2018   Noted on EKG   Presence of permanent cardiac pacemaker    Rosacea    Thrombocytopenia (HCC)    TIA (transient ischemic attack) 06/20/2015   TIA (transient ischemic attack)    2017  BP (!) 176/71   Pulse 61   Ht 6\' 2"  (1.88 m)   Wt 170 lb (77.1 kg)   SpO2 97%   BMI 21.83 kg/m   Opioid Risk Score:   Fall Risk Score:  `1  Depression screen [facility] 2/9     07/23/2022   10:56 AM 06/11/2022   11:41 AM 03/12/2022   11:42 AM 09/11/2021    9:47 AM 06/21/2021    9:39 AM 04/17/2021   12:49 PM 03/06/2021    3:07 PM  Depression screen [facility] 2/9  Decreased Interest 3 0 0 0 0 0 0  Down, Depressed, Hopeless 1 0 0 0 0 0 0  PHQ - 2 Score 4 0 0 0 0 0 0      Review of Systems  Musculoskeletal:  Positive for back pain.       Left leg pain  Neurological:  Positive for weakness.  All other systems reviewed and are negative.     Objective:   Physical Exam  Lumbar spine has 50% range flexion extension lateral bending and rotation.  He has some pain with lateral bending but otherwise without significant directional pain. Negative straight leg raise bilaterally Sensation intact light touch bilateral lower limbs Motor strength is 5/5 bilateral hip flexor knee extensor ankle dorsiflexor plantar flexor Ambulates without assistive device no evidence of toe drag or knee instability.      Assessment & Plan:   #1.  Lumbar spinal stenosis we discussed pros and cons of various treatment options including medication surgery interventional procedures.  We discussed alternative treatment such as acupuncture which he would like to explore.  We discussed that [facility] should reimburse acupuncture treatment at that could be done either at the [place] or at this office. Patient will explore this and  notify this office if he wishes to proceed.  After going to [place] pt wished to proceed with a treatment option of pregabalin.  Will call in Pregabalin 50mg  BID and titrate up from there if tolerated

## 2022-09-09 ENCOUNTER — Telehealth: Payer: Self-pay | Admitting: Physical Medicine & Rehabilitation

## 2022-09-09 MED ORDER — PREGABALIN 50 MG PO CAPS: 50.0000 mg | ORAL_CAPSULE | Freq: Two times a day (BID) | ORAL | 1 refills | Status: DC

## 2022-09-09 NOTE — Addendum Note (Signed)
Addended by: Erick Colace on: 09/09/2022 01:19 PM   Modules accepted: Orders

## 2022-09-09 NOTE — Telephone Encounter (Signed)
[facility] is requesting Lyrica for patient and Accupuncture. Lyrica will need to be sent to [facility] pharmacy. Accupuncture will need to be authorized by [facility] before scheduling.

## 2022-09-09 NOTE — Telephone Encounter (Signed)
Patient came to office after being seen at [facility].

## 2022-09-10 DIAGNOSIS — R7989 Other specified abnormal findings of blood chemistry: Secondary | ICD-10-CM | POA: Diagnosis not present

## 2022-09-10 DIAGNOSIS — D692 Other nonthrombocytopenic purpura: Secondary | ICD-10-CM | POA: Diagnosis not present

## 2022-09-10 DIAGNOSIS — Z7689 Persons encountering health services in other specified circumstances: Secondary | ICD-10-CM | POA: Diagnosis not present

## 2022-09-10 DIAGNOSIS — Z125 Encounter for screening for malignant neoplasm of prostate: Secondary | ICD-10-CM | POA: Diagnosis not present

## 2022-09-10 DIAGNOSIS — K219 Gastro-esophageal reflux disease without esophagitis: Secondary | ICD-10-CM | POA: Diagnosis not present

## 2022-09-10 DIAGNOSIS — E785 Hyperlipidemia, unspecified: Secondary | ICD-10-CM | POA: Diagnosis not present

## 2022-09-10 DIAGNOSIS — R7301 Impaired fasting glucose: Secondary | ICD-10-CM | POA: Diagnosis not present

## 2022-09-10 DIAGNOSIS — E039 Hypothyroidism, unspecified: Secondary | ICD-10-CM | POA: Diagnosis not present

## 2022-09-10 NOTE — Telephone Encounter (Signed)
[facility] called back acupuncture is not covered under current referral. Patient is scheduled at [facility] for acupuncture.

## 2022-09-14 DIAGNOSIS — Z008 Encounter for other general examination: Secondary | ICD-10-CM | POA: Diagnosis not present

## 2022-09-14 DIAGNOSIS — N529 Male erectile dysfunction, unspecified: Secondary | ICD-10-CM | POA: Diagnosis not present

## 2022-09-14 DIAGNOSIS — N400 Enlarged prostate without lower urinary tract symptoms: Secondary | ICD-10-CM | POA: Diagnosis not present

## 2022-09-14 DIAGNOSIS — M4800 Spinal stenosis, site unspecified: Secondary | ICD-10-CM | POA: Diagnosis not present

## 2022-09-14 DIAGNOSIS — K219 Gastro-esophageal reflux disease without esophagitis: Secondary | ICD-10-CM | POA: Diagnosis not present

## 2022-09-14 DIAGNOSIS — M5430 Sciatica, unspecified side: Secondary | ICD-10-CM | POA: Diagnosis not present

## 2022-09-14 DIAGNOSIS — R32 Unspecified urinary incontinence: Secondary | ICD-10-CM | POA: Diagnosis not present

## 2022-09-14 DIAGNOSIS — M1990 Unspecified osteoarthritis, unspecified site: Secondary | ICD-10-CM | POA: Diagnosis not present

## 2022-09-14 DIAGNOSIS — E039 Hypothyroidism, unspecified: Secondary | ICD-10-CM | POA: Diagnosis not present

## 2022-09-14 DIAGNOSIS — E1142 Type 2 diabetes mellitus with diabetic polyneuropathy: Secondary | ICD-10-CM | POA: Diagnosis not present

## 2022-09-14 DIAGNOSIS — H409 Unspecified glaucoma: Secondary | ICD-10-CM | POA: Diagnosis not present

## 2022-09-14 DIAGNOSIS — E785 Hyperlipidemia, unspecified: Secondary | ICD-10-CM | POA: Diagnosis not present

## 2022-09-14 DIAGNOSIS — N189 Chronic kidney disease, unspecified: Secondary | ICD-10-CM | POA: Diagnosis not present

## 2022-09-16 NOTE — Progress Notes (Signed)
Remote pacemaker transmission.   

## 2022-09-17 DIAGNOSIS — D696 Thrombocytopenia, unspecified: Secondary | ICD-10-CM | POA: Diagnosis not present

## 2022-09-17 DIAGNOSIS — R82998 Other abnormal findings in urine: Secondary | ICD-10-CM | POA: Diagnosis not present

## 2022-09-17 DIAGNOSIS — Q600 Renal agenesis, unilateral: Secondary | ICD-10-CM | POA: Diagnosis not present

## 2022-09-17 DIAGNOSIS — Z0000 Encounter for general adult medical examination without abnormal findings: Secondary | ICD-10-CM | POA: Diagnosis not present

## 2022-09-17 DIAGNOSIS — I739 Peripheral vascular disease, unspecified: Secondary | ICD-10-CM | POA: Diagnosis not present

## 2022-09-17 DIAGNOSIS — G459 Transient cerebral ischemic attack, unspecified: Secondary | ICD-10-CM | POA: Diagnosis not present

## 2022-09-17 DIAGNOSIS — Z950 Presence of cardiac pacemaker: Secondary | ICD-10-CM | POA: Diagnosis not present

## 2022-09-17 DIAGNOSIS — D849 Immunodeficiency, unspecified: Secondary | ICD-10-CM | POA: Diagnosis not present

## 2022-09-17 DIAGNOSIS — M1990 Unspecified osteoarthritis, unspecified site: Secondary | ICD-10-CM | POA: Diagnosis not present

## 2022-09-17 DIAGNOSIS — D692 Other nonthrombocytopenic purpura: Secondary | ICD-10-CM | POA: Diagnosis not present

## 2022-09-17 DIAGNOSIS — K5190 Ulcerative colitis, unspecified, without complications: Secondary | ICD-10-CM | POA: Diagnosis not present

## 2022-10-15 DIAGNOSIS — H16223 Keratoconjunctivitis sicca, not specified as Sjogren's, bilateral: Secondary | ICD-10-CM | POA: Diagnosis not present

## 2022-10-15 DIAGNOSIS — H35372 Puckering of macula, left eye: Secondary | ICD-10-CM | POA: Diagnosis not present

## 2022-10-15 DIAGNOSIS — H02401 Unspecified ptosis of right eyelid: Secondary | ICD-10-CM | POA: Diagnosis not present

## 2022-10-15 DIAGNOSIS — H401133 Primary open-angle glaucoma, bilateral, severe stage: Secondary | ICD-10-CM | POA: Diagnosis not present

## 2022-10-15 DIAGNOSIS — H35033 Hypertensive retinopathy, bilateral: Secondary | ICD-10-CM | POA: Diagnosis not present

## 2022-10-31 ENCOUNTER — Telehealth: Payer: Self-pay

## 2022-10-31 MED ORDER — PREGABALIN 50 MG PO CAPS: 50.0000 mg | ORAL_CAPSULE | Freq: Two times a day (BID) | ORAL | 0 refills | Status: DC

## 2022-10-31 NOTE — Telephone Encounter (Signed)
Dr. Wynn Banker is off today. Will you please send Joseph Hanna to the [place]?

## 2022-11-06 ENCOUNTER — Ambulatory Visit (INDEPENDENT_AMBULATORY_CARE_PROVIDER_SITE_OTHER): Payer: Medicare HMO

## 2022-11-06 DIAGNOSIS — I441 Atrioventricular block, second degree: Secondary | ICD-10-CM

## 2022-11-06 LAB — CUP PACEART REMOTE DEVICE CHECK
Battery Remaining Longevity: 53 mo
Battery Remaining Percentage: 46 %
Battery Voltage: 2.98 V
Brady Statistic AP VP Percent: 69 %
Brady Statistic AP VS Percent: 1 %
Brady Statistic AS VP Percent: 27 %
Brady Statistic AS VS Percent: 1.6 %
Brady Statistic RA Percent Paced: 66 %
Brady Statistic RV Percent Paced: 96 %
Date Time Interrogation Session: 20240626020015
Implantable Lead Connection Status: 753985
Implantable Lead Connection Status: 753985
Implantable Lead Implant Date: 20191226
Implantable Lead Implant Date: 20191226
Implantable Lead Location: 753859
Implantable Lead Location: 753860
Implantable Pulse Generator Implant Date: 20191226
Lead Channel Impedance Value: 490 Ohm
Lead Channel Impedance Value: 560 Ohm
Lead Channel Pacing Threshold Amplitude: 0.5 V
Lead Channel Pacing Threshold Amplitude: 0.5 V
Lead Channel Pacing Threshold Pulse Width: 0.5 ms
Lead Channel Pacing Threshold Pulse Width: 0.5 ms
Lead Channel Sensing Intrinsic Amplitude: 12 mV
Lead Channel Sensing Intrinsic Amplitude: 3.4 mV
Lead Channel Setting Pacing Amplitude: 0.75 V
Lead Channel Setting Pacing Amplitude: 2 V
Lead Channel Setting Pacing Pulse Width: 0.5 ms
Lead Channel Setting Sensing Sensitivity: 2 mV
Pulse Gen Model: 2272
Pulse Gen Serial Number: 9094310

## 2022-11-15 DIAGNOSIS — N3946 Mixed incontinence: Secondary | ICD-10-CM | POA: Diagnosis not present

## 2022-11-15 DIAGNOSIS — N5201 Erectile dysfunction due to arterial insufficiency: Secondary | ICD-10-CM | POA: Diagnosis not present

## 2022-11-15 DIAGNOSIS — N401 Enlarged prostate with lower urinary tract symptoms: Secondary | ICD-10-CM | POA: Diagnosis not present

## 2022-11-27 NOTE — Progress Notes (Signed)
 Remote pacemaker transmission.   

## 2022-11-29 ENCOUNTER — Encounter: Payer: Self-pay | Admitting: Physical Medicine & Rehabilitation

## 2022-11-29 ENCOUNTER — Encounter
Payer: No Typology Code available for payment source | Attending: Physical Medicine & Rehabilitation | Admitting: Physical Medicine & Rehabilitation

## 2022-11-29 VITALS — BP 158/74 | HR 67 | Ht 74.0 in | Wt 167.0 lb

## 2022-11-29 DIAGNOSIS — M48062 Spinal stenosis, lumbar region with neurogenic claudication: Secondary | ICD-10-CM

## 2022-11-29 NOTE — Progress Notes (Signed)
Dictation on: 11/29/2022 12:34 PM by: Erick Colace [[id number]]

## 2022-11-29 NOTE — Progress Notes (Signed)
Subjective:    Patient ID: Joseph Hanna, male    DOB: 07/07/1932, 89 y.o.   MRN: [id number]  HPI 89 year old male with spinal stenosis multilevel with primarily left lower extremity radicular symptoms.  His radicular pain is worse with lumbar extension as well as walking.  He has had some short-term relief with epidural injections.  We discussed other treatment options including acupuncture.  He is currently undergoing acupuncture with a [facility] approved provider.  Patient states that heat lamp was used but no electroacupuncture is utilized.  He is not noting much in terms of improvement thus far.  Pain Inventory Average Pain 6 Pain Right Now 0 My pain is sharp, stabbing, and aching  In the last 24 hours, has pain interfered with the following? General activity 5 Relation with others 6 Enjoyment of life 8 What TIME of day is your pain at its worst? daytime Sleep (in general) Good  Pain is worse with: walking, standing, and some activites Pain improves with: rest, medication, and Tylenol Relief from Meds:  8 with Tylenol  Family History  Problem Relation Age of Onset   Breast cancer Mother    Emphysema Father    Alzheimer's disease Brother    Social History   Socioeconomic History   Marital status: Married    Spouse name: Not on file   Number of children: 4   Years of education: Not on file   Highest education level: Not on file  Occupational History   Occupation: Retired-Salesman  Tobacco Use   Smoking status: Former    Current packs/day: 0.00    Types: Cigarettes    Start date: 12/02/1947    Quit date: 12/01/1953    Years since quitting: 69.0   Smokeless tobacco: Never  Vaping Use   Vaping status: Never Used  Substance and Sexual Activity   Alcohol use: Yes    Alcohol/week: 7.0 standard drinks of alcohol    Types: 7 Glasses of wine per week    Comment: glass of wine several days per week   Drug use: No   Sexual activity: Not on file  Other Topics Concern   Not  on file  Social History Narrative   Not on file   Social Determinants of Health   Financial Resource Strain: Not on file  Food Insecurity: Not on file  Transportation Needs: Not on file  Physical Activity: Not on file  Stress: Not on file  Social Connections: Unknown (09/25/2021)   Received from [facility]   Social Network    Social Network: Not on file   Past Surgical History:  Procedure Laterality Date   bilateral knee meniscus surgery      CATARACT EXTRACTION W/ INTRAOCULAR LENS  IMPLANT, BILATERAL     COLONOSCOPY  FEB 2015   GREEN LIGHT LASER TURP (TRANSURETHRAL RESECTION OF PROSTATE N/A 08/10/2013   Procedure: GREEN LIGHT LASER TURP (TRANSURETHRAL RESECTION OF PROSTATE;  Surgeon: Antony Haste, MD;  Location: [facility];  Service: Urology;  Laterality: N/A;   LUMBAR LAMINECTOMY  1990   MOHS SURGERY     x 2   NEPHRECTOMY Left 2003   ORIF CLAVICULAR FRACTURE  12/03/2011   Procedure: OPEN REDUCTION INTERNAL FIXATION (ORIF) CLAVICULAR FRACTURE;  Surgeon: Mable Paris, MD;  Location: [facility];  Service: Orthopedics;  Laterality: Right;   PACEMAKER IMPLANT N/A 05/07/2018    St Jude Medical Assurity MRI conditional  dual-chamber pacemaker by Dr Johney Frame for [employer] II second  degree AV block   right shoulder surgery      done at [facility]    SHOULDER ARTHROSCOPY Left LEFT  2012   TONSILLECTOMY  as child   TRABECULECTOMY  2011   left eye (glaucoma)   TRANSURETHRAL RESECTION OF PROSTATE N/A 07/24/2018   Procedure: TRANSURETHRAL RESECTION OF THE PROSTATE (TURP);  Surgeon: Jerilee Field, MD;  Location[facility];  Service: Urology;  Laterality: N/A;   Past Surgical History:  Procedure Laterality Date   bilateral knee meniscus surgery      CATARACT EXTRACTION W/ INTRAOCULAR LENS  IMPLANT, BILATERAL     COLONOSCOPY  FEB 2015   GREEN LIGHT LASER TURP (TRANSURETHRAL RESECTION OF PROSTATE N/A 08/10/2013   Procedure: GREEN LIGHT LASER TURP  (TRANSURETHRAL RESECTION OF PROSTATE;  Surgeon: Antony Haste, MD;  Location: [facility];  Service: Urology;  Laterality: N/A;   LUMBAR LAMINECTOMY  1990   MOHS SURGERY     x 2   NEPHRECTOMY Left 2003   ORIF CLAVICULAR FRACTURE  12/03/2011   Procedure: OPEN REDUCTION INTERNAL FIXATION (ORIF) CLAVICULAR FRACTURE;  Surgeon: Mable Paris, MD;  Location: [facility];  Service: Orthopedics;  Laterality: Right;   PACEMAKER IMPLANT N/A 05/07/2018    St Jude Medical Assurity MRI conditional  dual-chamber pacemaker by Dr Johney Frame for mobitz II second degree AV block   right shoulder surgery      done at [facility]    SHOULDER ARTHROSCOPY Left LEFT  2012   TONSILLECTOMY  as child   TRABECULECTOMY  2011   left eye (glaucoma)   TRANSURETHRAL RESECTION OF PROSTATE N/A 07/24/2018   Procedure: TRANSURETHRAL RESECTION OF THE PROSTATE (TURP);  Surgeon: Jerilee Field, MD;  Location[facility];  Service: Urology;  Laterality: N/A;   Past Medical History:  Diagnosis Date   Arthritis    BPH (benign prostatic hypertrophy)    Cancer (HCC)    kidney , skin cancer    CKD (chronic kidney disease)    Clavicle fracture    Right   ED (erectile dysfunction) of organic origin    Fatty liver 2020   [place]   Gallstones 2020   [place]   GERD (gastroesophageal reflux disease)    Glaucoma    bilateral --  right uses rx drops and left eye trabeculectomy 2011   Grade I diastolic dysfunction 04/21/2018   Noted on ECHO   History of bradycardia    History of gastroesophageal reflux (GERD)    History of left bundle branch block (LBBB) 05/05/2018   Noted on EKG   History of renal cell cancer    S/P  LEFT NEPHRECTOMY  2003--  no recurrence   History of ulcerative colitis    in remission   Hyperlipidemia    Hypothyroidism    Keratosis, actinic    Lower leg pain    pt states has fibula-tibula syndrome--  goes to physical therapy   LVH (left ventricular hypertrophy) 04/21/2018    Mode, noted on ECHO   Mobitz II 05/05/2018   Noted on EKG   Presence of permanent cardiac pacemaker    Rosacea    Thrombocytopenia (HCC)    TIA (transient ischemic attack) 06/20/2015   TIA (transient ischemic attack)    2017    There were no vitals taken for this visit.  Opioid Risk Score:   Fall Risk Score:  `1  Depression screen [facility] 2/9     07/23/2022   10:56 AM 06/11/2022   11:41  AM 03/12/2022   11:42 AM 09/11/2021    9:47 AM 06/21/2021    9:39 AM 04/17/2021   12:49 PM 03/06/2021    3:07 PM  Depression screen [facility] 2/9  Decreased Interest 3 0 0 0 0 0 0  Down, Depressed, Hopeless 1 0 0 0 0 0 0  PHQ - 2 Score 4 0 0 0 0 0 0    Review of Systems  Musculoskeletal:  Positive for back pain.       Pain in both lower legs  All other systems reviewed and are negative.      Objective:   Physical Exam  Mild tenderness palpation in the lumbar paraspinals on the left side greater than right side. No pain with lumbar flexion there is good lumbar range of motion in flexion of around 75% extension is limited to 25% as is lateral bending accompanied by pain. Negative straight leg raising bilaterally Motor strength is 5/5 bilateral hip flexors knee extensors ankle dorsiflexors plantar flexors Ambulates without assistive device no evidence of toe drag and instability      Assessment & Plan:   #1.  Lumbar spinal stenosis with neurogenic claudication.  Overall staying functional using pregabalin 50 mg twice daily continue current dose follow-up in 2 months for electroacupuncture

## 2022-12-03 ENCOUNTER — Ambulatory Visit: Payer: Medicare HMO | Admitting: Physical Medicine & Rehabilitation

## 2022-12-17 DIAGNOSIS — D485 Neoplasm of uncertain behavior of skin: Secondary | ICD-10-CM | POA: Diagnosis not present

## 2022-12-17 DIAGNOSIS — L812 Freckles: Secondary | ICD-10-CM | POA: Diagnosis not present

## 2022-12-17 DIAGNOSIS — D692 Other nonthrombocytopenic purpura: Secondary | ICD-10-CM | POA: Diagnosis not present

## 2022-12-17 DIAGNOSIS — L820 Inflamed seborrheic keratosis: Secondary | ICD-10-CM | POA: Diagnosis not present

## 2022-12-17 DIAGNOSIS — C44319 Basal cell carcinoma of skin of other parts of face: Secondary | ICD-10-CM | POA: Diagnosis not present

## 2022-12-17 DIAGNOSIS — L570 Actinic keratosis: Secondary | ICD-10-CM | POA: Diagnosis not present

## 2022-12-17 DIAGNOSIS — L821 Other seborrheic keratosis: Secondary | ICD-10-CM | POA: Diagnosis not present

## 2022-12-17 DIAGNOSIS — Z85828 Personal history of other malignant neoplasm of skin: Secondary | ICD-10-CM | POA: Diagnosis not present

## 2022-12-17 DIAGNOSIS — D1801 Hemangioma of skin and subcutaneous tissue: Secondary | ICD-10-CM | POA: Diagnosis not present

## 2023-01-20 ENCOUNTER — Encounter: Payer: Self-pay | Admitting: Medical Oncology

## 2023-01-20 ENCOUNTER — Other Ambulatory Visit: Payer: Self-pay

## 2023-01-20 ENCOUNTER — Inpatient Hospital Stay (HOSPITAL_BASED_OUTPATIENT_CLINIC_OR_DEPARTMENT_OTHER): Payer: Medicare HMO | Admitting: Medical Oncology

## 2023-01-20 ENCOUNTER — Inpatient Hospital Stay: Payer: Medicare HMO | Attending: Hematology & Oncology

## 2023-01-20 VITALS — BP 146/53 | HR 59

## 2023-01-20 DIAGNOSIS — D6959 Other secondary thrombocytopenia: Secondary | ICD-10-CM | POA: Diagnosis not present

## 2023-01-20 DIAGNOSIS — D696 Thrombocytopenia, unspecified: Secondary | ICD-10-CM

## 2023-01-20 DIAGNOSIS — Z8719 Personal history of other diseases of the digestive system: Secondary | ICD-10-CM | POA: Diagnosis not present

## 2023-01-20 LAB — CBC WITH DIFFERENTIAL (CANCER CENTER ONLY)
Abs Immature Granulocytes: 0.16 10*3/uL — ABNORMAL HIGH (ref 0.00–0.07)
Basophils Absolute: 0 10*3/uL (ref 0.0–0.1)
Basophils Relative: 0 %
Eosinophils Absolute: 0 10*3/uL (ref 0.0–0.5)
Eosinophils Relative: 0 %
HCT: 42.8 % (ref 39.0–52.0)
Hemoglobin: 13.1 g/dL (ref 13.0–17.0)
Immature Granulocytes: 2 %
Lymphocytes Relative: 18 %
Lymphs Abs: 1.6 10*3/uL (ref 0.7–4.0)
MCH: 30.4 pg (ref 26.0–34.0)
MCHC: 30.6 g/dL (ref 30.0–36.0)
MCV: 99.3 fL (ref 80.0–100.0)
Monocytes Absolute: 2.8 10*3/uL — ABNORMAL HIGH (ref 0.1–1.0)
Monocytes Relative: 31 %
Neutro Abs: 4.5 10*3/uL (ref 1.7–7.7)
Neutrophils Relative %: 49 %
Platelet Count: 36 10*3/uL — ABNORMAL LOW (ref 150–400)
RBC: 4.31 MIL/uL (ref 4.22–5.81)
RDW: 14.9 % (ref 11.5–15.5)
WBC Count: 9.1 10*3/uL (ref 4.0–10.5)
nRBC: 0 % (ref 0.0–0.2)

## 2023-01-20 LAB — CMP (CANCER CENTER ONLY)
ALT: 9 U/L (ref 0–44)
AST: 14 U/L — ABNORMAL LOW (ref 15–41)
Albumin: 4.6 g/dL (ref 3.5–5.0)
Alkaline Phosphatase: 40 U/L (ref 38–126)
Anion gap: 6 (ref 5–15)
BUN: 24 mg/dL — ABNORMAL HIGH (ref 8–23)
CO2: 30 mmol/L (ref 22–32)
Calcium: 10.1 mg/dL (ref 8.9–10.3)
Chloride: 109 mmol/L (ref 98–111)
Creatinine: 1.4 mg/dL — ABNORMAL HIGH (ref 0.61–1.24)
GFR, Estimated: 48 mL/min — ABNORMAL LOW (ref >60)
Glucose, Bld: 117 mg/dL — ABNORMAL HIGH (ref 70–99)
Potassium: 5.2 mmol/L — ABNORMAL HIGH (ref 3.5–5.1)
Sodium: 145 mmol/L (ref 135–145)
Total Bilirubin: 0.7 mg/dL (ref 0.3–1.2)
Total Protein: 7.3 g/dL (ref 6.5–8.1)

## 2023-01-20 LAB — SAVE SMEAR(SSMR), FOR PROVIDER SLIDE REVIEW

## 2023-01-20 MED ORDER — PREDNISONE 5 MG PO TABS: 5.0000 mg | ORAL_TABLET | Freq: Every day | ORAL | 1 refills | Status: DC

## 2023-01-20 NOTE — Progress Notes (Signed)
Hematology and Oncology Follow Up Visit  Joseph Hanna [id number] July 04, 1932 89 y.o. 01/20/2023   Principle Diagnosis:  Thrombocytopenia secondary to underlying liver disease and Imuran (for UC)  Current Therapy:   Observation   Interim History:  Joseph Hanna is here today for follow-up.    Other than his chronic back pain he is doing ok. Pain is a significant issue for him. He is unable to walk much due to the pain.   He is not having any nosebleeds, blood in his urine, blood in the stool.  Reports that he bruises very easily.  No recent flareup of his ulcerative colitis.  Has been off of the Imuran for roughly 3 months. This was stopped due to his age at the recommendation of his new UC provider.  He is not having any chest pain or shortness of breath.   Overall, I would have said that his performance status is probably ECOG 2.   Wt Readings from Last 3 Encounters:  11/29/22 167 lb (75.8 kg)  09/03/22 170 lb (77.1 kg)  07/23/22 170 lb (77.1 kg)     Medications:  Allergies as of 01/20/2023       Reactions   Itraconazole Rash   Other Rash   Atorvastatin Other (See Comments)   Other reaction(s): muscle and joint aches Other Reaction(s): muscle aches   Beta Adrenergic Blockers Other (See Comments)   Patient doesn't know reaction   Irbesartan Other (See Comments)   Patient doesn't know reaction    Terbinafine Rash        Medication List        Accurate as of January 20, 2023 10:14 AM. If you have any questions, ask your nurse or doctor.          STOP taking these medications    aspirin 81 MG chewable tablet Stopped by: Rushie Chestnut   Fish Oil 1000 MG Caps Stopped by: Rushie Chestnut   Imuran 50 MG tablet Generic drug: azaTHIOprine Stopped by: Rushie Chestnut       TAKE these medications    acetaminophen 500 MG tablet Commonly known as: TYLENOL Take 500 mg by mouth 2 (two) times daily.   Advil 200 MG tablet Generic drug: ibuprofen 2  tabs Orally as needed   Calcium Carb-Cholecalciferol 600-10 MG-MCG Tabs Take by mouth.   clotrimazole 1 % external solution Commonly known as: LOTRIMIN Apply topically.   Co Q 10 100 MG Caps (200mg ) 1 capsule with a meal Orally Once a day   CoQ10 200 MG Caps Take 1 capsule by mouth daily.   cycloSPORINE 0.05 % ophthalmic emulsion Commonly known as: RESTASIS Apply to eye.   dorzolamide 2 % ophthalmic solution Commonly known as: TRUSOPT Apply to eye.   finasteride 5 MG tablet Commonly known as: PROSCAR Take 5 mg by mouth daily.   fluorouracil 5 % cream Commonly known as: EFUDEX Apply 1 Application topically as needed (skin).   gabapentin 100 MG capsule Commonly known as: NEURONTIN Take by mouth.   gabapentin 300 MG capsule Commonly known as: NEURONTIN Take by mouth.   Ginkgo Biloba 40 MG Tabs Take 120 mg by mouth daily.   irbesartan 150 MG tablet Commonly known as: AVAPRO Take by mouth.   levothyroxine 50 MCG tablet Commonly known as: SYNTHROID Take 50-100 mcg by mouth daily before breakfast. Take T, Th, S,and S.  Takes 100 mcg M,W,and F.   Canasa 1000 MG suppository Generic drug: mesalamine 1 suppository at  bedtime Rectal Once a day for 90 days   Apriso 0.375 g 24 hr capsule Generic drug: mesalamine 4 caps orally once a day   mesalamine 1.2 g EC tablet Commonly known as: LIALDA Take by mouth.   mesalamine 1000 MG suppository Commonly known as: CANASA Place 1,000 mg rectally at bedtime.   Metrogel 1 % gel Generic drug: metroNIDAZOLE 1 application a thin layer to affected area Externally prn rosacea   MULTIVITAMIN ADULT PO SMARTSIG:1 By Mouth   Netarsudil-Latanoprost 0.02-0.005 % Soln Apply 1 drop to eye at bedtime. Right eye.   niacin 500 MG tablet Commonly known as: (VITAMIN B3) Take 500 mg by mouth at bedtime.   pregabalin 50 MG capsule Commonly known as: LYRICA Take 1 capsule (50 mg total) by mouth 2 (two) times daily. What  changed: Another medication with the same name was removed. Continue taking this medication, and follow the directions you see here. Changed by: Rushie Chestnut   silver sulfADIAZINE 1 % cream Commonly known as: SILVADENE Apply topically.   simvastatin 20 MG tablet Commonly known as: ZOCOR Take 10 mg by mouth daily. TAKES QHS   urea 20 % cream Commonly known as: CARMOL Apply topically as needed.   Urea 40 % Lotn 1 application to affected area Externally once a day   Cholecalciferol 50 MCG (2000 UT) Tabs Take by mouth.   Vitamin D3 25 MCG (1000 UT) Caps Take 2 capsules by mouth daily.        Allergies:  Allergies  Allergen Reactions   Itraconazole Rash   Other Rash   Atorvastatin Other (See Comments)    Other reaction(s): muscle and joint aches  Other Reaction(s): muscle aches   Beta Adrenergic Blockers Other (See Comments)    Patient doesn't know reaction   Irbesartan Other (See Comments)    Patient doesn't know reaction    Terbinafine Rash    Past Medical History, Surgical history, Social history, and Family History were reviewed and updated.  Review of Systems: Review of Systems  Constitutional: Negative.   HENT: Negative.    Eyes: Negative.   Respiratory: Negative.    Cardiovascular: Negative.   Gastrointestinal: Negative.   Genitourinary: Negative.   Musculoskeletal:  Positive for back pain.  Skin: Negative.   Neurological: Negative.   Endo/Heme/Allergies:  Bruises/bleeds easily.  Psychiatric/Behavioral: Negative.      Physical Exam:  blood pressure is 146/53 (abnormal) and his pulse is 59 (abnormal). His oxygen saturation is 95%.   Wt Readings from Last 3 Encounters:  11/29/22 167 lb (75.8 kg)  09/03/22 170 lb (77.1 kg)  07/23/22 170 lb (77.1 kg)    Physical Exam Vitals reviewed.  HENT:     Head: Normocephalic and atraumatic.  Eyes:     Pupils: Pupils are equal, round, and reactive to light.  Pulmonary:     Effort: Pulmonary effort  is normal. No respiratory distress.  Musculoskeletal:        General: No deformity. Normal range of motion.  Lymphadenopathy:     Cervical: No cervical adenopathy.  Skin:    General: Skin is warm and dry.     Findings: Bruising present. No erythema or rash.  Neurological:     Mental Status: He is alert and oriented to person, place, and time.  Psychiatric:        Behavior: Behavior normal.        Thought Content: Thought content normal.        Judgment: Judgment normal.  Lab Results  Component Value Date   WBC 9.1 01/20/2023   HGB 13.1 01/20/2023   HCT 42.8 01/20/2023   MCV 99.3 01/20/2023   PLT 36 (L) 01/20/2023   Lab Results  Component Value Date   FERRITIN 70 05/25/2019   IRON 96 05/25/2019   TIBC 308 05/25/2019   UIBC 212 05/25/2019   IRONPCTSAT 31 05/25/2019   Lab Results  Component Value Date   RBC 4.31 01/20/2023   No results found for: "KPAFRELGTCHN", "LAMBDASER", "KAPLAMBRATIO" No results found for: "IGGSERUM", "IGA", "IGMSERUM" No results found for: "TOTALPROTELP", "ALBUMINELP", "A1GS", "A2GS", "BETS", "BETA2SER", "GAMS", "MSPIKE", "SPEI"   Chemistry      Component Value Date/Time   NA 142 07/19/2022 0847   K 4.5 07/19/2022 0847   CL 105 07/19/2022 0847   CO2 29 07/19/2022 0847   BUN 27 (H) 07/19/2022 0847   CREATININE 1.41 (H) 07/19/2022 0847      Component Value Date/Time   CALCIUM 10.2 07/19/2022 0847   ALKPHOS 36 (L) 07/19/2022 0847   AST 13 (L) 07/19/2022 0847   ALT 10 07/19/2022 0847   BILITOT 0.6 07/19/2022 0847       Impression and Plan: Mr. Lesner is a very pleasant 89 yo caucasian gentleman with history of thrombocytopenia since at least February 2017.   He is currently on observation for thrombocytopenia. Over the last year his platelet count has been in the 40s. Now 36. No bleeding.  He is now off of the Imuran; this was a possible contributor or stabilizer of this thrombocytopenia. CMP pending though liver enzymes have been stable  historically.   If surgery if needed in the future, Nplate and/or possible steroids could be considered.   Today we discussed his pain and lower platelet count. We discussed trialing low dose daily steroids to see if this would help with either his pain or platelet count. He would like to proceed forward. 5 mg prednisone once daily. Reviewed common potential side effects. Also discussed red flags in regards to his thrombocytopenia. He will return in 3 months at which time we will also check nutritional labs given his history of UC.   Disposition 5mg  prednisone once daily added today RTC 3 months APP, labs ( CBC w/, CMP, LDH)-[place]   Rushie Chestnut, PA-C 9/9/202410:14 AM

## 2023-01-30 ENCOUNTER — Encounter
Payer: No Typology Code available for payment source | Attending: Physical Medicine & Rehabilitation | Admitting: Physical Medicine & Rehabilitation

## 2023-01-30 ENCOUNTER — Encounter: Payer: Self-pay | Admitting: Physical Medicine & Rehabilitation

## 2023-01-30 VITALS — BP 131/68 | HR 78 | Ht 74.0 in | Wt 166.0 lb

## 2023-01-30 DIAGNOSIS — G894 Chronic pain syndrome: Secondary | ICD-10-CM | POA: Insufficient documentation

## 2023-01-30 DIAGNOSIS — M48062 Spinal stenosis, lumbar region with neurogenic claudication: Secondary | ICD-10-CM | POA: Diagnosis present

## 2023-01-30 MED ORDER — TRAMADOL HCL 50 MG PO TABS: 50.0000 mg | ORAL_TABLET | Freq: Two times a day (BID) | ORAL | 1 refills | Status: DC

## 2023-01-30 MED ORDER — PREGABALIN 75 MG PO CAPS: 75.0000 mg | ORAL_CAPSULE | Freq: Two times a day (BID) | ORAL | 1 refills | Status: DC

## 2023-01-30 NOTE — Progress Notes (Signed)
Subjective:    Patient ID: Joseph Hanna, male    DOB: Oct 07, 1932, 89 y.o.   MRN: [id number]  HPI 89 year old male with multilevel lumbar spinal stenosis causing neurogenic claudication in the left lower extremity greater than the right lower extremity.  He has undergone left upper and lower lumbar transforaminal ESI's without any relief extending beyond a day or 2.  He has had surgical consultation and not advised to have surgery due to multilevel disease and age.  He has gone through physical therapy with some partial relief and he has been compliant with keeping up with the flexion-based community exercise program in the gym 3 times per week Acupuncture treatments tried a total 16 visit using heat x 45 min  Takes tylenol 500mg  , advil 200mg  , tylenol 500mg    Taking Lyrica 50 mg twice daily without much improvement in leg pain although at 1 point he states it was helping.  Pt states he is limiting his physical activity more due to pain and is looking for additional options.  We discussed potential S.E.s of pain medication in the elderly population  Pain Inventory Average Pain 7 Pain Right Now 7 My pain is intermittent, sharp, stabbing, and aching  In the last 24 hours, has pain interfered with the following? General activity 0 Relation with others 0 Enjoyment of life 0 What TIME of day is your pain at its worst? varies Sleep (in general) Fair  Pain is worse with: some activites Pain improves with: rest, heat/ice, and medication Relief from Meds: 5  Family History  Problem Relation Age of Onset   Breast cancer Mother    Emphysema Father    Alzheimer's disease Brother    Social History   Socioeconomic History   Marital status: Married    Spouse name: Not on file   Number of children: 4   Years of education: Not on file   Highest education level: Not on file  Occupational History   Occupation: Retired-Salesman  Tobacco Use   Smoking status: Former    Current  packs/day: 0.00    Types: Cigarettes    Start date: 12/02/1947    Quit date: 12/01/1953    Years since quitting: 69.2   Smokeless tobacco: Never  Vaping Use   Vaping status: Never Used  Substance and Sexual Activity   Alcohol use: Not Currently    Alcohol/week: 7.0 standard drinks of alcohol    Types: 7 Glasses of wine per week    Comment: glass of wine several days per week   Drug use: No   Sexual activity: Not Currently  Other Topics Concern   Not on file  Social History Narrative   Not on file   Social Determinants of Health   Financial Resource Strain: Not on file  Food Insecurity: Not on file  Transportation Needs: Not on file  Physical Activity: Not on file  Stress: Not on file  Social Connections: Unknown (09/25/2021)   Received from [facility]   Social Network    Social Network: Not on file   Past Surgical History:  Procedure Laterality Date   bilateral knee meniscus surgery      CATARACT EXTRACTION W/ INTRAOCULAR LENS  IMPLANT, BILATERAL     COLONOSCOPY  FEB 2015   GREEN LIGHT LASER TURP (TRANSURETHRAL RESECTION OF PROSTATE N/A 08/10/2013   Procedure: GREEN LIGHT LASER TURP (TRANSURETHRAL RESECTION OF PROSTATE;  Surgeon: Antony Haste, MD;  Location: [facility];  Service: Urology;  Laterality: N/A;   LUMBAR LAMINECTOMY  1990   MOHS SURGERY     x 2   NEPHRECTOMY Left 2003   ORIF CLAVICULAR FRACTURE  12/03/2011   Procedure: OPEN REDUCTION INTERNAL FIXATION (ORIF) CLAVICULAR FRACTURE;  Surgeon: Mable Paris, MD;  Location: [facility];  Service: Orthopedics;  Laterality: Right;   PACEMAKER IMPLANT N/A 05/07/2018    [employer] MRI conditional  dual-chamber pacemaker by Dr Johney Frame for mobitz II second degree AV block   right shoulder surgery      done at [facility]    SHOULDER ARTHROSCOPY Left LEFT  2012   TONSILLECTOMY  as child   TRABECULECTOMY  2011   left eye (glaucoma)   TRANSURETHRAL  RESECTION OF PROSTATE N/A 07/24/2018   Procedure: TRANSURETHRAL RESECTION OF THE PROSTATE (TURP);  Surgeon: Jerilee Field, MD;  Location: [facility];  Service: Urology;  Laterality: N/A;   Past Surgical History:  Procedure Laterality Date   bilateral knee meniscus surgery      CATARACT EXTRACTION W/ INTRAOCULAR LENS  IMPLANT, BILATERAL     COLONOSCOPY  FEB 2015   GREEN LIGHT LASER TURP (TRANSURETHRAL RESECTION OF PROSTATE N/A 08/10/2013   Procedure: GREEN LIGHT LASER TURP (TRANSURETHRAL RESECTION OF PROSTATE;  Surgeon: Antony Haste, MD;  Location: [facility];  Service: Urology;  Laterality: N/A;   LUMBAR LAMINECTOMY  1990   MOHS SURGERY     x 2   NEPHRECTOMY Left 2003   ORIF CLAVICULAR FRACTURE  12/03/2011   Procedure: OPEN REDUCTION INTERNAL FIXATION (ORIF) CLAVICULAR FRACTURE;  Surgeon: Mable Paris, MD;  Location:  SURGERY CENTER;  Service: Orthopedics;  Laterality: Right;   PACEMAKER IMPLANT N/A 05/07/2018    St Jude Medical Assurity MRI conditional  dual-chamber pacemaker by Dr Johney Frame for mobitz II second degree AV block   right shoulder surgery      done at [facility]    SHOULDER ARTHROSCOPY Left LEFT  2012   TONSILLECTOMY  as child   TRABECULECTOMY  2011   left eye (glaucoma)   TRANSURETHRAL RESECTION OF PROSTATE N/A 07/24/2018   Procedure: TRANSURETHRAL RESECTION OF THE PROSTATE (TURP);  Surgeon: Jerilee Field, MD;  Location: [facility];  Service: Urology;  Laterality: N/A;   Past Medical History:  Diagnosis Date   Arthritis    BPH (benign prostatic hypertrophy)    Cancer (HCC)    kidney , skin cancer    CKD (chronic kidney disease)    Clavicle fracture    Right   ED (erectile dysfunction) of organic origin    Fatty liver 2020   [place]   Gall[place] 2020   [place]   GERD (gastroesophageal reflux disease)    Glaucoma    bilateral --  right uses rx drops and left eye trabeculectomy 2011   Grade I diastolic dysfunction 04/21/2018   Noted on  ECHO   History of bradycardia    History of gastroesophageal reflux (GERD)    History of left bundle branch block (LBBB) 05/05/2018   Noted on EKG   History of renal cell cancer    S/P  LEFT NEPHRECTOMY  2003--  no recurrence   History of ulcerative colitis    in remission   Hyperlipidemia    Hypothyroidism    Keratosis, actinic    Lower leg pain    pt states has fibula-tibula syndrome--  goes to physical therapy   LVH (left ventricular hypertrophy) 04/21/2018   Mode, noted on ECHO  Mobitz II 05/05/2018   Noted on EKG   Presence of permanent cardiac pacemaker    Rosacea    Thrombocytopenia (HCC)    TIA (transient ischemic attack) 06/20/2015   TIA (transient ischemic attack)    2017    BP 131/68   Pulse 78   Ht 6\' 2"  (1.88 m)   Wt 166 lb (75.3 kg)   SpO2 97%   BMI 21.31 kg/m   Opioid Risk Score:   Fall Risk Score:  `1  Depression screen [facility] 2/9     01/30/2023    9:24 AM 11/29/2022    9:37 AM 07/23/2022   10:56 AM 06/11/2022   11:41 AM 03/12/2022   11:42 AM 09/11/2021    9:47 AM 06/21/2021    9:39 AM  Depression screen PHQ 2/9  Decreased Interest 0 0 3 0 0 0 0  Down, Depressed, Hopeless 0 0 1 0 0 0 0  PHQ - 2 Score 0 0 4 0 0 0 0      Review of Systems  Musculoskeletal:  Positive for back pain.  All other systems reviewed and are negative.     Objective:   Physical Exam  General No acute distress Mood and affect are appropriate Negative straight leg raising bilaterally Lower extremity strength is 5/5 bilateral hip flexion knee extension ankle dorsiflexion Lumbar spine is no pain with forward flexion has limitation and pain with lumbar extension but this is only mild.  No pain with lateral bending to the right or the left side. Ambulates without assistive device no evidence of toe drag or knee instability.      Assessment & Plan:   1.  Lumbar spinal stenosis multilevel with neurogenic claudication affecting left lower extremity greater than right lower  extremity.  Previously failing epidural steroid injections.  Partial relief with PT.  Will increase pregabalin to 75 mg twice daily.  Trial of tramadol 50 mg twice daily follow-up in 1 month to discuss medication changes.  If the patient gets good relief with the tramadol will need to do UDS testing as well as sign a controlled substance agreement.  Starting high risk medication requiring therapeutic monitoring clinically and with labs

## 2023-01-30 NOTE — Patient Instructions (Signed)
Tramadol for back pain   Lyrica for leg

## 2023-02-05 ENCOUNTER — Ambulatory Visit (INDEPENDENT_AMBULATORY_CARE_PROVIDER_SITE_OTHER): Payer: Medicare HMO

## 2023-02-05 DIAGNOSIS — I441 Atrioventricular block, second degree: Secondary | ICD-10-CM

## 2023-02-05 LAB — CUP PACEART REMOTE DEVICE CHECK
Battery Remaining Longevity: 49 mo
Battery Remaining Percentage: 43 %
Battery Voltage: 2.98 V
Brady Statistic AP VP Percent: 66 %
Brady Statistic AP VS Percent: 4.7 %
Brady Statistic AS VP Percent: 23 %
Brady Statistic AS VS Percent: 4 %
Brady Statistic RA Percent Paced: 67 %
Brady Statistic RV Percent Paced: 90 %
Date Time Interrogation Session: 20240925020017
Implantable Lead Connection Status: 753985
Implantable Lead Connection Status: 753985
Implantable Lead Implant Date: 20191226
Implantable Lead Implant Date: 20191226
Implantable Lead Location: 753859
Implantable Lead Location: 753860
Implantable Pulse Generator Implant Date: 20191226
Lead Channel Impedance Value: 440 Ohm
Lead Channel Impedance Value: 540 Ohm
Lead Channel Pacing Threshold Amplitude: 0.5 V
Lead Channel Pacing Threshold Amplitude: 0.5 V
Lead Channel Pacing Threshold Pulse Width: 0.5 ms
Lead Channel Pacing Threshold Pulse Width: 0.5 ms
Lead Channel Sensing Intrinsic Amplitude: 12 mV
Lead Channel Sensing Intrinsic Amplitude: 3.3 mV
Lead Channel Setting Pacing Amplitude: 0.75 V
Lead Channel Setting Pacing Amplitude: 2 V
Lead Channel Setting Pacing Pulse Width: 0.5 ms
Lead Channel Setting Sensing Sensitivity: 2 mV
Pulse Gen Model: 2272
Pulse Gen Serial Number: 9094310

## 2023-02-17 DIAGNOSIS — M532X6 Spinal instabilities, lumbar region: Secondary | ICD-10-CM | POA: Diagnosis not present

## 2023-02-17 DIAGNOSIS — M545 Low back pain, unspecified: Secondary | ICD-10-CM | POA: Diagnosis not present

## 2023-02-17 DIAGNOSIS — M4316 Spondylolisthesis, lumbar region: Secondary | ICD-10-CM | POA: Diagnosis not present

## 2023-02-17 DIAGNOSIS — M48061 Spinal stenosis, lumbar region without neurogenic claudication: Secondary | ICD-10-CM | POA: Diagnosis not present

## 2023-02-21 NOTE — Progress Notes (Signed)
Remote pacemaker transmission.   

## 2023-02-24 NOTE — Progress Notes (Unsigned)
Subjective:    Patient ID: Joseph Hanna, male    DOB: 1933-01-08, 89 y.o.   MRN: [id number]  HPI  89 year old male with lumbar spinal stenosis with foraminal stenosis mainly at left L1-L2.  He does have concordant hip pain.  He has tried lumbar epidural injections without relief.  He has seen neurosurgery in the past, no surgical intervention was recommended.  The patient has had consultation with an orthopedic surgeon in [place] who is a neighbor of his daughters.  An x-ray was ordered and performed showing evidence of grade 1 spondylolisthesis at L4-5 Works out 3 x per week , weight lifting and stretching Last visit approximately 1 month ago trial of tramadol 50 mg twice daily which she feels has been very helpful.  This is primarily for his low back pain we discussed his over-the-counter medication usage advised him no longer to use the Advil and instead just use the Tylenol on a as needed basis For his leg pain he is getting some relief with the Lyrica 75 twice daily  Pain Inventory Average Pain 6 Pain Right Now 6 My pain is sharp and stabbing  In the last 24 hours, has pain interfered with the following? General activity 5 Relation with others 4 Enjoyment of life 5 What TIME of day is your pain at its worst? daytime Sleep (in general) Good  Pain is worse with: walking, standing, and some activites Pain improves with: medication Relief from Meds: 7  Family History  Problem Relation Age of Onset   Breast cancer Mother    Emphysema Father    Alzheimer's disease Brother    Social History   Socioeconomic History   Marital status: Married    Spouse name: Not on file   Number of children: 4   Years of education: Not on file   Highest education level: Not on file  Occupational History   Occupation: Retired-Salesman  Tobacco Use   Smoking status: Former    Current packs/day: 0.00    Types: Cigarettes    Start date: 12/02/1947    Quit date: 12/01/1953    Years since  quitting: 69.2   Smokeless tobacco: Never  Vaping Use   Vaping status: Never Used  Substance and Sexual Activity   Alcohol use: Not Currently    Alcohol/week: 7.0 standard drinks of alcohol    Types: 7 Glasses of wine per week    Comment: glass of wine several days per week   Drug use: No   Sexual activity: Not Currently  Other Topics Concern   Not on file  Social History Narrative   Not on file   Social Determinants of Health   Financial Resource Strain: Not on file  Food Insecurity: Not on file  Transportation Needs: Not on file  Physical Activity: Not on file  Stress: Not on file  Social Connections: Unknown (09/25/2021)   Received [facility]   Social Network    Social Network: Not on file   Past Surgical History:  Procedure Laterality Date   bilateral knee meniscus surgery      CATARACT EXTRACTION W/ INTRAOCULAR LENS  IMPLANT, BILATERAL     COLONOSCOPY  FEB 2015   GREEN LIGHT LASER TURP (TRANSURETHRAL RESECTION OF PROSTATE N/A 08/10/2013   Procedure: GREEN LIGHT LASER TURP (TRANSURETHRAL RESECTION OF PROSTATE;  Surgeon: Antony Haste, MD;  Location: [facility];  Service: Urology;  Laterality: N/A;   LUMBAR LAMINECTOMY  1990   MOHS  SURGERY     x 2   NEPHRECTOMY Left 2003   ORIF CLAVICULAR FRACTURE  12/03/2011   Procedure: OPEN REDUCTION INTERNAL FIXATION (ORIF) CLAVICULAR FRACTURE;  Surgeon: Mable Paris, MD;  Location: [facility];  Service: Orthopedics;  Laterality: Right;   PACEMAKER IMPLANT N/A 05/07/2018    St Jude Medical Assurity MRI conditional  dual-chamber pacemaker by Dr Johney Frame for mobitz II second degree AV block   right shoulder surgery      done at [facility]    SHOULDER ARTHROSCOPY Left LEFT  2012   TONSILLECTOMY  as child   TRABECULECTOMY  2011   left eye (glaucoma)   TRANSURETHRAL RESECTION OF PROSTATE N/A 07/24/2018   Procedure: TRANSURETHRAL RESECTION OF THE PROSTATE (TURP);  Surgeon:  Jerilee Field, MD;  Location: [facility];  Service: Urology;  Laterality: N/A;   Past Surgical History:  Procedure Laterality Date   bilateral knee meniscus surgery      CATARACT EXTRACTION W/ INTRAOCULAR LENS  IMPLANT, BILATERAL     COLONOSCOPY  FEB 2015   GREEN LIGHT LASER TURP (TRANSURETHRAL RESECTION OF PROSTATE N/A 08/10/2013   Procedure: GREEN LIGHT LASER TURP (TRANSURETHRAL RESECTION OF PROSTATE;  Surgeon: Antony Haste, MD;  Location: [facility];  Service: Urology;  Laterality: N/A;   LUMBAR LAMINECTOMY  1990   MOHS SURGERY     x 2   NEPHRECTOMY Left 2003   ORIF CLAVICULAR FRACTURE  12/03/2011   Procedure: OPEN REDUCTION INTERNAL FIXATION (ORIF) CLAVICULAR FRACTURE;  Surgeon: Mable Paris, MD;  Location: [facility];  Service: Orthopedics;  Laterality: Right;   PACEMAKER IMPLANT N/A 05/07/2018    St Jude Medical Assurity MRI conditional  dual-chamber pacemaker by Dr Johney Frame for mobitz II second degree AV block   right shoulder surgery      done at [facility]    SHOULDER ARTHROSCOPY Left LEFT  2012   TONSILLECTOMY  as child   TRABECULECTOMY  2011   left eye (glaucoma)   TRANSURETHRAL RESECTION OF PROSTATE N/A 07/24/2018   Procedure: TRANSURETHRAL RESECTION OF THE PROSTATE (TURP);  Surgeon: Jerilee Field, MD;  Location: [facility];  Service: Urology;  Laterality: N/A;   Past Medical History:  Diagnosis Date   Arthritis    BPH (benign prostatic hypertrophy)    Cancer (HCC)    kidney , skin cancer    CKD (chronic kidney disease)    Clavicle fracture    Right   ED (erectile dysfunction) of organic origin    Fatty liver 2020   [place]   Gallstones 2020   [place]   GERD (gastroesophageal reflux disease)    Glaucoma    bilateral --  right uses rx drops and left eye trabeculectomy 2011   Grade I diastolic dysfunction 04/21/2018   Noted on ECHO   History of bradycardia    History of gastroesophageal reflux (GERD)    History of left bundle  branch block (LBBB) 05/05/2018   Noted on EKG   History of renal cell cancer    S/P  LEFT NEPHRECTOMY  2003--  no recurrence   History of ulcerative colitis    in remission   Hyperlipidemia    Hypothyroidism    Keratosis, actinic    Lower leg pain    pt states has fibula-tibula syndrome--  goes to physical therapy   LVH (left ventricular hypertrophy) 04/21/2018   Mode, noted on ECHO   Mobitz II 05/05/2018   Noted on EKG  Presence of permanent cardiac pacemaker    Rosacea    Thrombocytopenia (HCC)    TIA (transient ischemic attack) 06/20/2015   TIA (transient ischemic attack)    2017    There were no vitals taken for this visit.  Opioid Risk Score:   Fall Risk Score:  `1  Depression screen [facility] 2/9     01/30/2023    9:24 AM 11/29/2022    9:37 AM 07/23/2022   10:56 AM 06/11/2022   11:41 AM 03/12/2022   11:42 AM 09/11/2021    9:47 AM 06/21/2021    9:39 AM  Depression screen PHQ 2/9  Decreased Interest 0 0 3 0 0 0 0  Down, Depressed, Hopeless 0 0 1 0 0 0 0  PHQ - 2 Score 0 0 4 0 0 0 0    Review of Systems  Musculoskeletal:  Positive for back pain.       Left lower leg pain  All other systems reviewed and are negative.     Objective:   Physical Exam Vitals and nursing note reviewed.  Constitutional:      Appearance: He is normal weight.  HENT:     Head: Normocephalic and atraumatic.  Eyes:     Extraocular Movements: Extraocular movements intact.     Conjunctiva/sclera: Conjunctivae normal.     Pupils: Pupils are equal, round, and reactive to light.  Musculoskeletal:     Comments: Lumbar flexion is 75% of normal extension 25% of normal.  More pain with extension than with flexion.   Neurological:     Mental Status: He is alert and oriented to person, place, and time.     Comments: Lower extremity strength 5/5 bilateral hip flexor knee extensor ankle dorsiflexor Ambulates without assist device no evidence of toe drag or knee instability Negative straight leg  raising bilaterally  Psychiatric:        Mood and Affect: Mood normal.        Behavior: Behavior normal.           Assessment & Plan:   #1.  Lumbar spinal stenosis with L1 to stenosis on the left side.  He is doing better with the tramadol for his axial back pain takes a 50 mg twice daily we discussed that this could cause some-will mental fogging.  He feels like his memory has gotten worse over the last year.  In fact he left his checkbook at the check-in counter at this office in receptionist brought this back to the exam room for him. We discussed that tramadol typically is better tolerated from a cognitive side effect standpoint than some the other narcotic still has potential to cause some worsening of cognitive deficits. We also discussed that Lyrica could also be contributing although his symptoms have started prior to starting Lyrica back in July 2024. He is interested in what sounds like an XLIF procedure performed by orthopedic surgeon in [place].  [facility]. The patient will likely need preoperative clearance from cardiology and or hematology given his history of thrombocytopenia. I will see him back in this office in 6 months UDS today since we are prescribing tramadol on a regular basis. He has been instructed to call if he has any worsening of symptoms so we can get him in sooner.

## 2023-02-25 ENCOUNTER — Encounter: Payer: Medicare HMO | Attending: Physical Medicine & Rehabilitation | Admitting: Physical Medicine & Rehabilitation

## 2023-02-25 ENCOUNTER — Encounter: Payer: Self-pay | Admitting: Physical Medicine & Rehabilitation

## 2023-02-25 VITALS — BP 155/83 | HR 70 | Ht 74.0 in | Wt 166.0 lb

## 2023-02-25 DIAGNOSIS — M48062 Spinal stenosis, lumbar region with neurogenic claudication: Secondary | ICD-10-CM | POA: Diagnosis not present

## 2023-02-25 NOTE — Patient Instructions (Addendum)
Take tylenol as needed  Stop Ibuprofen  Continue Tramadol twice a day   Cont Lyrica 75mg  twice a day

## 2023-02-28 LAB — TOXASSURE SELECT,+ANTIDEPR,UR

## 2023-03-18 ENCOUNTER — Encounter: Payer: Medicare HMO | Attending: Physical Medicine & Rehabilitation | Admitting: Physical Medicine & Rehabilitation

## 2023-03-18 ENCOUNTER — Encounter: Payer: Self-pay | Admitting: Physical Medicine & Rehabilitation

## 2023-03-18 VITALS — BP 148/72 | HR 62 | Ht 74.0 in | Wt 164.0 lb

## 2023-03-18 DIAGNOSIS — M48062 Spinal stenosis, lumbar region with neurogenic claudication: Secondary | ICD-10-CM | POA: Insufficient documentation

## 2023-03-18 NOTE — Patient Instructions (Addendum)
May take tylenol 1-2 tablets in place of tramadol,   Stop tramadol  No Advil   Continue Lyrica 75mg  twice a day  Please call in 1 week to report on your pain and report on any improvements in mental fogginess   May take lyrica and tylenol

## 2023-03-18 NOTE — Progress Notes (Signed)
Subjective:    Patient ID: Joseph Hanna, male    DOB: 27-Apr-1933, 89 y.o.   MRN: [id number]  HPI CC: Mental fogginess  89 year old male with lumbar spinal stenosis primarily affecting left upper lumbar region with intermittent claudication left hip and thigh area. While the patient states that his pain is well controlled with the combination of tramadol 50 mg twice daily as well as Lyrica 75 twice daily, he feels like his memory and level of alertness have suffered.  He was told by his he should not be on a narcotic.  His current pain score is 2/10 worsened with walking. UDS reviewed and appropriate the patient has adequate levels tramadol active metabolite.  No falls reported.  Remains independent with all self-care and mobility Pain Inventory Average Pain 2 Pain Right Now 2 My pain is sharp and stabbing  In the last 24 hours, has pain interfered with the following? General activity 2 Relation with others 3 Enjoyment of life 4 What TIME of day is your pain at its worst? evening Sleep (in general) NA  Pain is worse with: walking, standing, and some activites Pain improves with: rest and medication Relief from Meds: 7  Family History  Problem Relation Age of Onset   Breast cancer Mother    Emphysema Father    Alzheimer's disease Brother    Social History   Socioeconomic History   Marital status: Married    Spouse name: Not on file   Number of children: 4   Years of education: Not on file   Highest education level: Not on file  Occupational History   Occupation: Retired-Salesman  Tobacco Use   Smoking status: Former    Current packs/day: 0.00    Types: Cigarettes    Start date: 12/02/1947    Quit date: 12/01/1953    Years since quitting: 69.3   Smokeless tobacco: Never  Vaping Use   Vaping status: Never Used  Substance and Sexual Activity   Alcohol use: Not Currently    Alcohol/week: 7.0 standard drinks of alcohol    Types: 7 Glasses of wine per week     Comment: glass of wine several days per week   Drug use: No   Sexual activity: Not Currently  Other Topics Concern   Not on file  Social History Narrative   Not on file   Social Determinants of Health   Financial Resource Strain: Not on file  Food Insecurity: Not on file  Transportation Needs: Not on file  Physical Activity: Not on file  Stress: Not on file  Social Connections: Unknown (09/25/2021)   [facility]   Social Network    Social Network: Not on file   Past Surgical History:  Procedure Laterality Date   bilateral knee meniscus surgery      CATARACT EXTRACTION W/ INTRAOCULAR LENS  IMPLANT, BILATERAL     COLONOSCOPY  FEB 2015   GREEN LIGHT LASER TURP (TRANSURETHRAL RESECTION OF PROSTATE N/A 08/10/2013   Procedure: GREEN LIGHT LASER TURP (TRANSURETHRAL RESECTION OF PROSTATE;  Surgeon: Antony Haste, MD;  Location: [facility];  Service: Urology;  Laterality: N/A;   LUMBAR LAMINECTOMY  1990   MOHS SURGERY     x 2   NEPHRECTOMY Left 2003   ORIF CLAVICULAR FRACTURE  12/03/2011   Procedure: OPEN REDUCTION INTERNAL FIXATION (ORIF) CLAVICULAR FRACTURE;  Surgeon: Mable Paris, MD;  Location: [facility];  Service: Orthopedics;  Laterality: Right;  PACEMAKER IMPLANT N/A 05/07/2018    St Jude Medical Assurity MRI conditional  dual-chamber pacemaker by Dr Johney Frame for mobitz II second degree AV block   right shoulder surgery      done at [facility]    SHOULDER ARTHROSCOPY Left LEFT  2012   TONSILLECTOMY  as child   TRABECULECTOMY  2011   left eye (glaucoma)   TRANSURETHRAL RESECTION OF PROSTATE N/A 07/24/2018   Procedure: TRANSURETHRAL RESECTION OF THE PROSTATE (TURP);  Surgeon: Jerilee Field, MD;  Location: [facility];  Service: Urology;  Laterality: N/A;   Past Surgical History:  Procedure Laterality Date   bilateral knee meniscus surgery      CATARACT EXTRACTION W/ INTRAOCULAR LENS  IMPLANT, BILATERAL      COLONOSCOPY  FEB 2015   GREEN LIGHT LASER TURP (TRANSURETHRAL RESECTION OF PROSTATE N/A 08/10/2013   Procedure: GREEN LIGHT LASER TURP (TRANSURETHRAL RESECTION OF PROSTATE;  Surgeon: Antony Haste, MD;  Location: [facility];  Service: Urology;  Laterality: N/A;   LUMBAR LAMINECTOMY  1990   MOHS SURGERY     x 2   NEPHRECTOMY Left 2003   ORIF CLAVICULAR FRACTURE  12/03/2011   Procedure: OPEN REDUCTION INTERNAL FIXATION (ORIF) CLAVICULAR FRACTURE;  Surgeon: Mable Paris, MD;  Location: [facility];  Service: Orthopedics;  Laterality: Right;   PACEMAKER IMPLANT N/A 05/07/2018    St Jude Medical Assurity MRI conditional  dual-chamber pacemaker by Dr Johney Frame for mobitz II second degree AV block   right shoulder surgery      done at [facility]    SHOULDER ARTHROSCOPY Left LEFT  2012   TONSILLECTOMY  as child   TRABECULECTOMY  2011   left eye (glaucoma)   TRANSURETHRAL RESECTION OF PROSTATE N/A 07/24/2018   Procedure: TRANSURETHRAL RESECTION OF THE PROSTATE (TURP);  Surgeon: Jerilee Field, MD;  Location: [facility];  Service: Urology;  Laterality: N/A;   Past Medical History:  Diagnosis Date   Arthritis    BPH (benign prostatic hypertrophy)    Cancer (HCC)    kidney , skin cancer    CKD (chronic kidney disease)    Clavicle fracture    Right   ED (erectile dysfunction) of organic origin    Fatty liver 2020   [place]   Gallstones 2020   [place]   GERD (gastroesophageal reflux disease)    Glaucoma    bilateral --  right uses rx drops and left eye trabeculectomy 2011   Grade I diastolic dysfunction 04/21/2018   Noted on ECHO   History of bradycardia    History of gastroesophageal reflux (GERD)    History of left bundle branch block (LBBB) 05/05/2018   Noted on EKG   History of renal cell cancer    S/P  LEFT NEPHRECTOMY  2003--  no recurrence   History of ulcerative colitis    in remission   Hyperlipidemia    Hypothyroidism    Keratosis, actinic     Lower leg pain    pt states has fibula-tibula syndrome--  goes to physical therapy   LVH (left ventricular hypertrophy) 04/21/2018   Mode, noted on ECHO   Mobitz II 05/05/2018   Noted on EKG   Presence of permanent cardiac pacemaker    Rosacea    Thrombocytopenia (HCC)    TIA (transient ischemic attack) 06/20/2015   TIA (transient ischemic attack)    2017    BP (!) 152/78   Pulse 62   Ht 6\' 2"  (1.88 m)  Wt 164 lb (74.4 kg)   SpO2 96%   BMI 21.06 kg/m   Opioid Risk Score:   Fall Risk Score:  `1  Depression screen [facility] 2/9     03/18/2023   10:17 AM 01/30/2023    9:24 AM 11/29/2022    9:37 AM 07/23/2022   10:56 AM 06/11/2022   11:41 AM 03/12/2022   11:42 AM 09/11/2021    9:47 AM  Depression screen PHQ 2/9  Decreased Interest 0 0 0 3 0 0 0  Down, Depressed, Hopeless 0 0 0 1 0 0 0  PHQ - 2 Score 0 0 0 4 0 0 0    Review of Systems  Constitutional: Negative.   HENT: Negative.    Eyes: Negative.   Respiratory: Negative.    Cardiovascular: Negative.   Gastrointestinal: Negative.   Endocrine: Negative.   Genitourinary: Negative.   Musculoskeletal:  Positive for arthralgias and back pain.  Skin: Negative.   Allergic/Immunologic: Negative.   Neurological: Negative.   Hematological: Negative.   Psychiatric/Behavioral: Negative.    All other systems reviewed and are negative.      Objective:   Physical Exam General No acute distress Mood and affect appropriate Ambulates without assist device no evidence of toe drag or knee instability Motor strength is 5/5 bilateral hip flexor knee extensor ankle dorsiflexor Negative straight leg raise bilaterally Lumbar spine no tenderness palpation lumbar paraspinals or and PSIS area.  No tenderness over the gluteal region or over the greater trochanters.      Assessment & Plan:   1.  Lumbar spinal stenosis with neurogenic claudication symptoms primarily affecting left lower extremity in the upper lumbar region. We discussed that  his mental fogginess could be a result of tramadol or Lyrica or a combination of both.  We discussed that the Lyrica dose was increased from 50 to 75 mg at the same time tramadol was started. Recommend to stop tramadol and see what his pain level is.  He can substitute Tylenol 1 to 2 tablets twice a day for the tramadol. We also discussed avoiding ibuprofen given his age and potential for side effects. Patient is to call back in 1 to 2 weeks to let us know how he does with the medication change.  If his mental fogginess improves and pain level stays the same we can likely maintain the patient on Lyrica 75 mg twice daily.  If his pain worsens we may consider trying an increased dose of Lyrica to 75 3 times daily.  Over half of the 30-minute visit was spent counseling coronation of care.  Instructions were written out and repeated.  We discussed other treatment options and the pros and cons of these.  These include nonsteroidal anti-inflammatories.

## 2023-03-24 ENCOUNTER — Telehealth: Payer: Self-pay | Admitting: Cardiology

## 2023-03-24 NOTE — Telephone Encounter (Signed)
Patients daughter, Alvino Chapel is calling very frustrated with the month long process of the forms needed for patients MRI. She states 2 weeks ago, Dr. Luvenia Starch office faxed a blank pacing form, an MRI order, and a same day x-ray order to [phone], but hasn't heard anything back from our office in regards to the forms faxed. Smitty Cords that this is something the Device Clinic has to fill out with it being a pacemaker. She would really like a call back from one of the nurses tomorrow, 11/12 to explain this process and what the next steps are.

## 2023-03-24 NOTE — Telephone Encounter (Signed)
Patient's daughter called wanting to know if forms were received from Dr. Berneta Sages office to be completed for her father to have a MRI done.  She states it was sent about 2 to 3 weeks ago.

## 2023-03-24 NOTE — Telephone Encounter (Signed)
Just received forms this pm, will get completed and faxed back tomorrow. 11/12. Daughter Alvino Chapel is aware.

## 2023-03-25 ENCOUNTER — Telehealth: Payer: Self-pay

## 2023-03-25 NOTE — Telephone Encounter (Signed)
Alvino Chapel, patients daughter, called asking if the MRI order is in yet. Advised that its during lunch so likely not til after lunch time. She states she will call back in a few hours to check on it as the patient (her dad) is very anxious to have this done and will have to give him an update by the end of the day.

## 2023-03-25 NOTE — Telephone Encounter (Signed)
Pt's daughter has called our office a couple times today to get the MRI for this pt scheduled however we have no MRI order or information on what MRI this pt needs. Our device clinic sent over the MRI clearance form to the office of Dr. Luvenia Starch in [place]. Raj Janus, RN spoke with Dr. Luvenia Starch office on the phone and told them we are not the ones that order this MRI, our office was just involved to send the MRI clearance. The person he spoke to said they will take care of it.

## 2023-03-25 NOTE — Telephone Encounter (Signed)
Sherri isn't here today - just wanted to include this tidbit to hopefully get some leadway on this issue. The patients spine doctor in [place] fax orders over for the MRI, a pacing form was included in that fax due to patient having a PPM so that is the reason for [place] being involved with this. Mandy RN in the device clinic filled out the forms and faxed them back to the office in [place]. The office in [place] can not place the order for the MRI in our system. Device, anything you can speak/include on this so we can get the MRI in?

## 2023-03-25 NOTE — Telephone Encounter (Signed)
Patients daughter, Alvino Chapel is calling back today in reference to the MRI. She states Sherry at Dr. Luvenia Starch office, that is in [place], told her the MRI would be done in [place] at [facility]. Alvino Chapel called to get transferred to the person who schedules the MRI, I let Alvino Chapel know that although an MRI order was faxed from Dr. Luvenia Starch office, an order has to be entered into the system so the MRI scheduler will know to call and schedule. She is asking if this can be expedited as she has been getting the run around on the fax from Dr. Audrea Muscat office. She would like a call back at the number listed, to be reassured the order is entered, and the MRI can be scheduled quickly. Please call back.

## 2023-03-26 NOTE — Telephone Encounter (Signed)
EP scheduler, Cassie, informed me that dtr was made aware that our office has done our part for this need, that device clearance form for MRI has been filled out by our office and sent back. Dtr made aware that the ordering provider will need to reach out to hospital about the order, that we cannot aid in this MRI order/need.

## 2023-03-27 ENCOUNTER — Telehealth: Payer: Self-pay | Admitting: Physical Medicine & Rehabilitation

## 2023-03-27 NOTE — Telephone Encounter (Signed)
Patient came in and wanted to report that since he has been off Tramadol he feels Lyrica has taken over and pain levels are good. Patient does notice that he gets queezy until noon , the mental fogginess is still there and he hasn't had to take any Tylenol . Patient is open to come in for a follow up if needed

## 2023-03-31 NOTE — Telephone Encounter (Signed)
Notified. 

## 2023-04-01 NOTE — Telephone Encounter (Signed)
error 

## 2023-04-03 DIAGNOSIS — M5125 Other intervertebral disc displacement, thoracolumbar region: Secondary | ICD-10-CM | POA: Diagnosis not present

## 2023-04-03 DIAGNOSIS — M545 Low back pain, unspecified: Secondary | ICD-10-CM | POA: Diagnosis not present

## 2023-04-03 DIAGNOSIS — M47817 Spondylosis without myelopathy or radiculopathy, lumbosacral region: Secondary | ICD-10-CM | POA: Diagnosis not present

## 2023-04-03 DIAGNOSIS — Z950 Presence of cardiac pacemaker: Secondary | ICD-10-CM | POA: Diagnosis not present

## 2023-04-03 DIAGNOSIS — Z1389 Encounter for screening for other disorder: Secondary | ICD-10-CM | POA: Diagnosis not present

## 2023-04-03 DIAGNOSIS — M47816 Spondylosis without myelopathy or radiculopathy, lumbar region: Secondary | ICD-10-CM | POA: Diagnosis not present

## 2023-04-03 DIAGNOSIS — M48061 Spinal stenosis, lumbar region without neurogenic claudication: Secondary | ICD-10-CM | POA: Diagnosis not present

## 2023-04-08 DIAGNOSIS — M5417 Radiculopathy, lumbosacral region: Secondary | ICD-10-CM | POA: Diagnosis not present

## 2023-04-09 DIAGNOSIS — Q600 Renal agenesis, unilateral: Secondary | ICD-10-CM | POA: Diagnosis not present

## 2023-04-09 DIAGNOSIS — I739 Peripheral vascular disease, unspecified: Secondary | ICD-10-CM | POA: Diagnosis not present

## 2023-04-09 DIAGNOSIS — Z950 Presence of cardiac pacemaker: Secondary | ICD-10-CM | POA: Diagnosis not present

## 2023-04-09 DIAGNOSIS — D696 Thrombocytopenia, unspecified: Secondary | ICD-10-CM | POA: Diagnosis not present

## 2023-04-09 DIAGNOSIS — Z79899 Other long term (current) drug therapy: Secondary | ICD-10-CM | POA: Diagnosis not present

## 2023-04-09 DIAGNOSIS — M1990 Unspecified osteoarthritis, unspecified site: Secondary | ICD-10-CM | POA: Diagnosis not present

## 2023-04-09 DIAGNOSIS — D849 Immunodeficiency, unspecified: Secondary | ICD-10-CM | POA: Diagnosis not present

## 2023-04-09 DIAGNOSIS — G459 Transient cerebral ischemic attack, unspecified: Secondary | ICD-10-CM | POA: Diagnosis not present

## 2023-04-09 DIAGNOSIS — E785 Hyperlipidemia, unspecified: Secondary | ICD-10-CM | POA: Diagnosis not present

## 2023-04-09 DIAGNOSIS — D692 Other nonthrombocytopenic purpura: Secondary | ICD-10-CM | POA: Diagnosis not present

## 2023-04-09 DIAGNOSIS — R413 Other amnesia: Secondary | ICD-10-CM | POA: Diagnosis not present

## 2023-04-09 DIAGNOSIS — M47816 Spondylosis without myelopathy or radiculopathy, lumbar region: Secondary | ICD-10-CM | POA: Diagnosis not present

## 2023-04-15 DIAGNOSIS — H401133 Primary open-angle glaucoma, bilateral, severe stage: Secondary | ICD-10-CM | POA: Diagnosis not present

## 2023-04-15 DIAGNOSIS — H35372 Puckering of macula, left eye: Secondary | ICD-10-CM | POA: Diagnosis not present

## 2023-04-15 DIAGNOSIS — H35033 Hypertensive retinopathy, bilateral: Secondary | ICD-10-CM | POA: Diagnosis not present

## 2023-04-15 DIAGNOSIS — H16223 Keratoconjunctivitis sicca, not specified as Sjogren's, bilateral: Secondary | ICD-10-CM | POA: Diagnosis not present

## 2023-04-21 DIAGNOSIS — L570 Actinic keratosis: Secondary | ICD-10-CM | POA: Diagnosis not present

## 2023-04-21 DIAGNOSIS — Z85828 Personal history of other malignant neoplasm of skin: Secondary | ICD-10-CM | POA: Diagnosis not present

## 2023-04-21 DIAGNOSIS — L821 Other seborrheic keratosis: Secondary | ICD-10-CM | POA: Diagnosis not present

## 2023-04-21 DIAGNOSIS — D1801 Hemangioma of skin and subcutaneous tissue: Secondary | ICD-10-CM | POA: Diagnosis not present

## 2023-04-21 DIAGNOSIS — C44622 Squamous cell carcinoma of skin of right upper limb, including shoulder: Secondary | ICD-10-CM | POA: Diagnosis not present

## 2023-04-21 DIAGNOSIS — D485 Neoplasm of uncertain behavior of skin: Secondary | ICD-10-CM | POA: Diagnosis not present

## 2023-04-21 DIAGNOSIS — L812 Freckles: Secondary | ICD-10-CM | POA: Diagnosis not present

## 2023-04-22 DIAGNOSIS — M5416 Radiculopathy, lumbar region: Secondary | ICD-10-CM | POA: Diagnosis not present

## 2023-04-28 ENCOUNTER — Encounter (INDEPENDENT_AMBULATORY_CARE_PROVIDER_SITE_OTHER): Payer: Medicare HMO | Admitting: Ophthalmology

## 2023-04-28 DIAGNOSIS — H4421 Degenerative myopia, right eye: Secondary | ICD-10-CM | POA: Diagnosis not present

## 2023-04-28 DIAGNOSIS — H33302 Unspecified retinal break, left eye: Secondary | ICD-10-CM | POA: Diagnosis not present

## 2023-04-28 DIAGNOSIS — H35372 Puckering of macula, left eye: Secondary | ICD-10-CM

## 2023-04-28 DIAGNOSIS — H43813 Vitreous degeneration, bilateral: Secondary | ICD-10-CM

## 2023-04-29 DIAGNOSIS — R413 Other amnesia: Secondary | ICD-10-CM | POA: Diagnosis not present

## 2023-04-29 DIAGNOSIS — R4181 Age-related cognitive decline: Secondary | ICD-10-CM | POA: Diagnosis not present

## 2023-05-02 DIAGNOSIS — C44622 Squamous cell carcinoma of skin of right upper limb, including shoulder: Secondary | ICD-10-CM | POA: Diagnosis not present

## 2023-05-06 DIAGNOSIS — Z0100 Encounter for examination of eyes and vision without abnormal findings: Secondary | ICD-10-CM | POA: Diagnosis not present

## 2023-05-06 DIAGNOSIS — H524 Presbyopia: Secondary | ICD-10-CM | POA: Diagnosis not present

## 2023-05-08 ENCOUNTER — Ambulatory Visit: Payer: Medicare HMO | Attending: Cardiology

## 2023-05-08 DIAGNOSIS — I441 Atrioventricular block, second degree: Secondary | ICD-10-CM | POA: Diagnosis not present

## 2023-05-08 LAB — CUP PACEART REMOTE DEVICE CHECK
Battery Remaining Longevity: 47 mo
Battery Remaining Percentage: 41 %
Battery Voltage: 2.98 V
Brady Statistic AP VP Percent: 64 %
Brady Statistic AP VS Percent: 7.6 %
Brady Statistic AS VP Percent: 19 %
Brady Statistic AS VS Percent: 8 %
Brady Statistic RA Percent Paced: 68 %
Brady Statistic RV Percent Paced: 83 %
Date Time Interrogation Session: 20241226020014
Implantable Lead Connection Status: 753985
Implantable Lead Connection Status: 753985
Implantable Lead Implant Date: 20191226
Implantable Lead Implant Date: 20191226
Implantable Lead Location: 753859
Implantable Lead Location: 753860
Implantable Pulse Generator Implant Date: 20191226
Lead Channel Impedance Value: 430 Ohm
Lead Channel Impedance Value: 510 Ohm
Lead Channel Pacing Threshold Amplitude: 0.5 V
Lead Channel Pacing Threshold Amplitude: 0.5 V
Lead Channel Pacing Threshold Pulse Width: 0.5 ms
Lead Channel Pacing Threshold Pulse Width: 0.5 ms
Lead Channel Sensing Intrinsic Amplitude: 12 mV
Lead Channel Sensing Intrinsic Amplitude: 3 mV
Lead Channel Setting Pacing Amplitude: 0.75 V
Lead Channel Setting Pacing Amplitude: 2 V
Lead Channel Setting Pacing Pulse Width: 0.5 ms
Lead Channel Setting Sensing Sensitivity: 2 mV
Pulse Gen Model: 2272
Pulse Gen Serial Number: 9094310

## 2023-05-09 NOTE — Progress Notes (Deleted)
  Electrophysiology Office Note:   ID:  PERRI FRANCKOWIAK, DOB [birth year], MRN [id number]  Primary Cardiologist: Verne Carrow, MD Electrophysiologist: Will Jorja Loa, MD  {Click to update primary MD,subspecialty MD or APP then REFRESH:1}    History of Present Illness:   Joseph Hanna is a 89 y.o. male with h/o h/o RCC s/p nephrectomy, HLD, hypothyroidism, TIA, and AV block s/p PPM seen today for routine electrophysiology followup.   Since last being seen in our clinic the patient reports doing ***.  he denies chest pain, palpitations, dyspnea, PND, orthopnea, nausea, vomiting, dizziness, syncope, edema, weight gain, or early satiety.   Review of systems complete and found to be negative unless listed in HPI.   EP Information / Studies Reviewed:    EKG is ordered today. Personal review as below.       PPM Interrogation-  reviewed in detail today,  See PACEART report.  Device History: Abbott Dual Chamber PPM implanted 04/2018 for Second Degree AV block  ***  Physical Exam:   VS:  There were no vitals taken for this visit.   Wt Readings from Last 3 Encounters:  03/18/23 164 lb (74.4 kg)  02/25/23 166 lb (75.3 kg)  01/30/23 166 lb (75.3 kg)     GEN: Well nourished, well developed in no acute distress NECK: No JVD; No carotid bruits CARDIAC: {EPRHYTHM:28826}, no murmurs, rubs, gallops RESPIRATORY:  Clear to auscultation without rales, wheezing or rhonchi  ABDOMEN: Soft, non-tender, non-distended EXTREMITIES:  No edema; No deformity   ASSESSMENT AND PLAN:    Second Degree AV block s/p Abbott PPM  Normal PPM function See Pace Art report No changes today  Paroxysmal AF *** burden  CHA2DS2VASc of at least 4  Thrombocytopenia Follows with heme   {Click here to Review PMH, Prob List, Meds, Allergies, SHx, FHx  :1}   Disposition:   Follow up with {EPPROVIDERS:28135} {EPFOLLOW UP:28173}  Signed, Graciella Freer, PA-C

## 2023-05-12 ENCOUNTER — Ambulatory Visit: Payer: Medicare HMO | Admitting: Student

## 2023-05-19 NOTE — Progress Notes (Signed)
  Electrophysiology Office Note:   ID:  Joseph Hanna, DOB 11/20/1932, MRN [id number]  Primary Cardiologist: Lonni Cash, MD Electrophysiologist: Soyla Gladis Norton, MD      History of Present Illness:   Joseph Hanna is a 89 y.o. male with h/o h/o RCC s/p nephrectomy, HLD, hypothyroidism, TIA, and AV block s/p PPM seen today for routine electrophysiology followup.   Since last being seen in our clinic the patient reports doing well. Overall,  he denies chest pain, palpitations, dyspnea, PND, orthopnea, nausea, vomiting, dizziness, syncope, edema, weight gain, or early satiety.  Has had some memory issues but recent visit with PCP was reassuring for no larger issues.   Review of systems complete and found to be negative unless listed in HPI.   EP Information / Studies Reviewed:    EKG is ordered today. Personal review as below.  EKG Interpretation Date/Time:  Tuesday May 20 2023 08:54:42 EST Ventricular Rate:  72 PR Interval:  168 QRS Duration:  154 QT Interval:  430 QTC Calculation: 470 R Axis:   58  Text Interpretation: Sinus rhythm with marked sinus arrhythmia with frequent atrial-paced complexes Left bundle branch block When compared with ECG of 21-Jul-2018 10:40, Electronic atrial pacemaker has replaced Electronic ventricular pacemaker Confirmed by Lesia Sharper [phone]) on 05/20/2023 9:00:42 AM    PPM Interrogation-  reviewed in detail today,  See PACEART report.  Device History: Abbott Dual Chamber PPM implanted 04/2018 for Second Degree AV block  Physical Exam:   VS:  BP 138/72   Pulse 72   Ht 6' 2 (1.88 m)   Wt 163 lb (73.9 kg)   SpO2 97%   BMI 20.93 kg/m    Wt Readings from Last 3 Encounters:  05/20/23 163 lb (73.9 kg)  03/18/23 164 lb (74.4 kg)  02/25/23 166 lb (75.3 kg)     GEN: Well nourished, well developed in no acute distress NECK: No JVD; No carotid bruits CARDIAC: Regular rate and rhythm, no murmurs, rubs, gallops RESPIRATORY:  Clear  to auscultation without rales, wheezing or rhonchi  ABDOMEN: Soft, non-tender, non-distended EXTREMITIES:  No edema; No deformity   ASSESSMENT AND PLAN:    Second Degree AV block s/p Abbott PPM  Normal PPM function See Pace Art report No changes today  Paroxysmal AF CHA2DS2VASc of at least 4. Not on OAC with thrombocytopenia and low burden.  Thrombocytopenia Follows with heme  HTN Stable on current regimen    Disposition:   Follow up with Dr. Norton in 12 months  Signed, Sharper Prentice Lesia, PA-C

## 2023-05-20 ENCOUNTER — Encounter: Payer: Self-pay | Admitting: Student

## 2023-05-20 ENCOUNTER — Ambulatory Visit: Payer: Medicare HMO | Attending: Student | Admitting: Student

## 2023-05-20 VITALS — BP 138/72 | HR 72 | Ht 74.0 in | Wt 163.0 lb

## 2023-05-20 DIAGNOSIS — I10 Essential (primary) hypertension: Secondary | ICD-10-CM

## 2023-05-20 DIAGNOSIS — D696 Thrombocytopenia, unspecified: Secondary | ICD-10-CM | POA: Diagnosis not present

## 2023-05-20 DIAGNOSIS — I480 Paroxysmal atrial fibrillation: Secondary | ICD-10-CM | POA: Diagnosis not present

## 2023-05-20 DIAGNOSIS — I441 Atrioventricular block, second degree: Secondary | ICD-10-CM | POA: Diagnosis not present

## 2023-05-20 LAB — CUP PACEART INCLINIC DEVICE CHECK
Battery Remaining Longevity: 46 mo
Battery Voltage: 2.96 V
Brady Statistic RA Percent Paced: 67 %
Brady Statistic RV Percent Paced: 82 %
Date Time Interrogation Session: 20250107091116
Implantable Lead Connection Status: 753985
Implantable Lead Connection Status: 753985
Implantable Lead Implant Date: 20191226
Implantable Lead Implant Date: 20191226
Implantable Lead Location: 753859
Implantable Lead Location: 753860
Implantable Pulse Generator Implant Date: 20191226
Lead Channel Impedance Value: 437.5 Ohm
Lead Channel Impedance Value: 512.5 Ohm
Lead Channel Pacing Threshold Amplitude: 0.5 V
Lead Channel Pacing Threshold Amplitude: 0.5 V
Lead Channel Pacing Threshold Amplitude: 0.5 V
Lead Channel Pacing Threshold Pulse Width: 0.5 ms
Lead Channel Pacing Threshold Pulse Width: 0.5 ms
Lead Channel Pacing Threshold Pulse Width: 0.5 ms
Lead Channel Sensing Intrinsic Amplitude: 12 mV
Lead Channel Sensing Intrinsic Amplitude: 3.6 mV
Lead Channel Setting Pacing Amplitude: 0.75 V
Lead Channel Setting Pacing Amplitude: 2 V
Lead Channel Setting Pacing Pulse Width: 0.5 ms
Lead Channel Setting Sensing Sensitivity: 2 mV
Pulse Gen Model: 2272
Pulse Gen Serial Number: 9094310

## 2023-05-20 NOTE — Patient Instructions (Signed)
 Medication Instructions:  Your physician recommends that you continue on your current medications as directed. Please refer to the Current Medication list given to you today.  *If you need a refill on your cardiac medications before your next appointment, please call your pharmacy*  Lab Work: None ordered If you have labs (blood work) drawn today and your tests are completely normal, you will receive your results only by: MyChart Message (if you have MyChart) OR A paper copy in the mail If you have any lab test that is abnormal or we need to change your treatment, we will call you to review the results.  Follow-Up: At [facility], you and your health needs are our priority.  As part of our continuing mission to provide you with exceptional heart care, we have created designated Provider Care Teams.  These Care Teams include your primary Cardiologist (physician) and Advanced Practice Providers (APPs -  Physician Assistants and Nurse Practitioners) who all work together to provide you with the care you need, when you need it.  Your next appointment:   1 year(s)  Provider:   Loman Brooklyn, MD

## 2023-06-13 NOTE — Progress Notes (Signed)
 Remote pacemaker transmission.

## 2023-06-23 ENCOUNTER — Other Ambulatory Visit: Payer: Self-pay | Admitting: *Deleted

## 2023-06-23 DIAGNOSIS — D696 Thrombocytopenia, unspecified: Secondary | ICD-10-CM

## 2023-06-23 MED ORDER — PREDNISONE 5 MG PO TABS: 5.0000 mg | ORAL_TABLET | Freq: Every day | ORAL | 1 refills | Status: DC

## 2023-07-07 ENCOUNTER — Other Ambulatory Visit: Payer: Self-pay | Admitting: Medical Oncology

## 2023-07-07 ENCOUNTER — Inpatient Hospital Stay: Payer: Medicare HMO | Attending: Hematology & Oncology

## 2023-07-07 ENCOUNTER — Inpatient Hospital Stay (HOSPITAL_BASED_OUTPATIENT_CLINIC_OR_DEPARTMENT_OTHER): Payer: Medicare HMO | Admitting: Medical Oncology

## 2023-07-07 ENCOUNTER — Encounter: Payer: Self-pay | Admitting: Medical Oncology

## 2023-07-07 VITALS — BP 126/57 | HR 71 | Temp 98.0°F | Resp 18 | Ht 74.0 in | Wt 171.0 lb

## 2023-07-07 DIAGNOSIS — Z8719 Personal history of other diseases of the digestive system: Secondary | ICD-10-CM

## 2023-07-07 DIAGNOSIS — D696 Thrombocytopenia, unspecified: Secondary | ICD-10-CM

## 2023-07-07 DIAGNOSIS — K5190 Ulcerative colitis, unspecified, without complications: Secondary | ICD-10-CM | POA: Insufficient documentation

## 2023-07-07 DIAGNOSIS — D72829 Elevated white blood cell count, unspecified: Secondary | ICD-10-CM

## 2023-07-07 DIAGNOSIS — Z7952 Long term (current) use of systemic steroids: Secondary | ICD-10-CM | POA: Insufficient documentation

## 2023-07-07 LAB — CBC WITH DIFFERENTIAL (CANCER CENTER ONLY)
Abs Immature Granulocytes: 0.32 10*3/uL — ABNORMAL HIGH (ref 0.00–0.07)
Basophils Absolute: 0 10*3/uL (ref 0.0–0.1)
Basophils Relative: 0 %
Eosinophils Absolute: 0 10*3/uL (ref 0.0–0.5)
Eosinophils Relative: 0 %
HCT: 42.4 % (ref 39.0–52.0)
Hemoglobin: 13.3 g/dL (ref 13.0–17.0)
Immature Granulocytes: 3 %
Lymphocytes Relative: 14 %
Lymphs Abs: 1.7 10*3/uL (ref 0.7–4.0)
MCH: 31.3 pg (ref 26.0–34.0)
MCHC: 31.4 g/dL (ref 30.0–36.0)
MCV: 99.8 fL (ref 80.0–100.0)
Monocytes Absolute: 3.2 10*3/uL — ABNORMAL HIGH (ref 0.1–1.0)
Monocytes Relative: 27 %
Neutro Abs: 6.7 10*3/uL (ref 1.7–7.7)
Neutrophils Relative %: 56 %
Platelet Count: 36 10*3/uL — ABNORMAL LOW (ref 150–400)
RBC: 4.25 MIL/uL (ref 4.22–5.81)
RDW: 15.9 % — ABNORMAL HIGH (ref 11.5–15.5)
WBC Count: 11.9 10*3/uL — ABNORMAL HIGH (ref 4.0–10.5)
nRBC: 0 % (ref 0.0–0.2)

## 2023-07-07 LAB — CMP (CANCER CENTER ONLY)
ALT: 12 U/L (ref 0–44)
AST: 14 U/L — ABNORMAL LOW (ref 15–41)
Albumin: 4.5 g/dL (ref 3.5–5.0)
Alkaline Phosphatase: 37 U/L — ABNORMAL LOW (ref 38–126)
Anion gap: 7 (ref 5–15)
BUN: 27 mg/dL — ABNORMAL HIGH (ref 8–23)
CO2: 30 mmol/L (ref 22–32)
Calcium: 10.1 mg/dL (ref 8.9–10.3)
Chloride: 108 mmol/L (ref 98–111)
Creatinine: 1.53 mg/dL — ABNORMAL HIGH (ref 0.61–1.24)
GFR, Estimated: 43 mL/min — ABNORMAL LOW (ref >60)
Glucose, Bld: 83 mg/dL (ref 70–99)
Potassium: 4.4 mmol/L (ref 3.5–5.1)
Sodium: 145 mmol/L (ref 135–145)
Total Bilirubin: 0.6 mg/dL (ref 0.0–1.2)
Total Protein: 6.8 g/dL (ref 6.5–8.1)

## 2023-07-07 LAB — IRON AND IRON BINDING CAPACITY (CC-WL,HP ONLY)
Iron: 93 ug/dL (ref 45–182)
Saturation Ratios: 24 % (ref 17.9–39.5)
TIBC: 389 ug/dL (ref 250–450)
UIBC: 296 ug/dL (ref 117–376)

## 2023-07-07 LAB — FOLATE: Folate: 44.5 ng/mL (ref >5.9)

## 2023-07-07 LAB — FERRITIN: Ferritin: 33 ng/mL (ref 24–336)

## 2023-07-07 LAB — VITAMIN B12: Vitamin B-12: 540 pg/mL (ref 180–914)

## 2023-07-07 NOTE — Progress Notes (Signed)
 Hematology and Oncology Follow Up Visit  Joseph Hanna [id number] 21-Apr-1933 89 y.o. 07/07/2023   Principle Diagnosis:  Thrombocytopenia secondary to underlying liver disease and Imuran (for UC)  Current Therapy:   Observation   Interim History:  Joseph Hanna is here today for follow-up.    He reports that a pain specialist in [place] gave him a shot in the L4 area of his back.    He is not having any nosebleeds, blood in his urine, blood in the stool. No bleeding of the gums. Reports that he bruises very easily.  No recent flareup of his ulcerative colitis.  Has been off of the Imuran. This was stopped due to his age at the recommendation of his new UC provider. Symptoms are ok off of this medication.    He continues to take his prednisone 5 mg once daily with breakfast- this was added after his last visit here with [place]. He is tolerating it well.   He is not having any chest pain or shortness of breath. No fevers or unintentional weight loss.   Weight and appetite are stable.   Overall, I would have said that his performance status is probably ECOG 2.   Wt Readings from Last 3 Encounters:  07/07/23 171 lb (77.6 kg)  05/20/23 163 lb (73.9 kg)  03/18/23 164 lb (74.4 kg)   Medications:  Allergies as of 07/07/2023       Reactions   Itraconazole Rash   Other Rash   Atorvastatin Other (See Comments)   Other reaction(s): muscle and joint aches Other Reaction(s): muscle aches   Beta Adrenergic Blockers Other (See Comments)   Patient doesn't know reaction   Irbesartan Other (See Comments)   Patient doesn't know reaction    Terbinafine Rash        Medication List        Accurate as of July 07, 2023  9:45 AM. If you have any questions, ask your nurse or doctor.          STOP taking these medications    traMADol 50 MG tablet Commonly known as: ULTRAM Stopped by: Rushie Chestnut       TAKE these medications    acetaminophen 500 MG tablet Commonly  known as: TYLENOL Take 500 mg by mouth 2 (two) times daily.   Calcium Carb-Cholecalciferol 600-10 MG-MCG Tabs Take by mouth.   Cholecalciferol 50 MCG (2000 UT) Tabs Take by mouth.   clotrimazole 1 % external solution Commonly known as: LOTRIMIN Apply topically.   CoQ10 200 MG Caps Take 1 capsule by mouth daily.   cycloSPORINE 0.05 % ophthalmic emulsion Commonly known as: RESTASIS Apply to eye.   dorzolamide 2 % ophthalmic solution Commonly known as: TRUSOPT Apply to eye.   finasteride 5 MG tablet Commonly known as: PROSCAR Take 5 mg by mouth daily.   Ginkgo Biloba 40 MG Tabs Take 120 mg by mouth daily.   irbesartan 150 MG tablet Commonly known as: AVAPRO Take by mouth.   levothyroxine 50 MCG tablet Commonly known as: SYNTHROID Take 50-100 mcg by mouth daily before breakfast. Take T, Th, S,and S.  Takes 100 mcg M,W,and F.   Apriso 0.375 g 24 hr capsule Generic drug: mesalamine 4 caps orally once a day   mesalamine 1.2 g EC tablet Commonly known as: LIALDA Take by mouth.   mesalamine 1000 MG suppository Commonly known as: CANASA Place 1,000 mg rectally at bedtime.   Metrogel 1 % gel Generic drug: metroNIDAZOLE  1 application a thin layer to affected area Externally prn rosacea   MULTIVITAMIN ADULT PO SMARTSIG:1 By Mouth   Netarsudil-Latanoprost 0.02-0.005 % Soln Apply 1 drop to eye at bedtime. Right eye.   niacin 500 MG tablet Commonly known as: (VITAMIN B3) Take 500 mg by mouth at bedtime.   predniSONE 5 MG tablet Commonly known as: DELTASONE Take 1 tablet (5 mg total) by mouth daily with breakfast.   pregabalin 75 MG capsule Commonly known as: LYRICA Take 1 capsule (75 mg total) by mouth 2 (two) times daily.   simvastatin 20 MG tablet Commonly known as: ZOCOR Take 10 mg by mouth daily. TAKES QHS        Allergies:  Allergies  Allergen Reactions   Itraconazole Rash   Other Rash   Atorvastatin Other (See Comments)    Other  reaction(s): muscle and joint aches  Other Reaction(s): muscle aches   Beta Adrenergic Blockers Other (See Comments)    Patient doesn't know reaction   Irbesartan Other (See Comments)    Patient doesn't know reaction    Terbinafine Rash    Past Medical History, Surgical history, Social history, and Family History were reviewed and updated.  Review of Systems: Review of Systems  Constitutional: Negative.   HENT: Negative.    Eyes: Negative.   Respiratory: Negative.    Cardiovascular: Negative.   Gastrointestinal: Negative.   Genitourinary: Negative.   Musculoskeletal:  Negative for back pain.  Skin: Negative.   Neurological: Negative.   Endo/Heme/Allergies:  Bruises/bleeds easily.  Psychiatric/Behavioral: Negative.      Physical Exam:  height is 6\' 2"  (1.88 m) and weight is 171 lb (77.6 kg). His oral temperature is 98 F (36.7 C). His blood pressure is 126/57 (abnormal) and his pulse is 71. His respiration is 18 and oxygen saturation is 99%.   Wt Readings from Last 3 Encounters:  07/07/23 171 lb (77.6 kg)  05/20/23 163 lb (73.9 kg)  03/18/23 164 lb (74.4 kg)    Physical Exam Vitals reviewed.  HENT:     Head: Normocephalic and atraumatic.  Eyes:     Pupils: Pupils are equal, round, and reactive to light.  Pulmonary:     Effort: Pulmonary effort is normal. No respiratory distress.  Musculoskeletal:        General: No deformity. Normal range of motion.  Lymphadenopathy:     Cervical: No cervical adenopathy.  Skin:    General: Skin is warm and dry.     Findings: Bruising present. No erythema or rash.  Neurological:     Mental Status: He is alert and oriented to person, place, and time.  Psychiatric:        Behavior: Behavior normal.        Thought Content: Thought content normal.        Judgment: Judgment normal.    Lab Results  Component Value Date   WBC 11.9 (H) 07/07/2023   HGB 13.3 07/07/2023   HCT 42.4 07/07/2023   MCV 99.8 07/07/2023   PLT 36 (L)  07/07/2023   Lab Results  Component Value Date   FERRITIN 70 05/25/2019   IRON 96 05/25/2019   TIBC 308 05/25/2019   UIBC 212 05/25/2019   IRONPCTSAT 31 05/25/2019   Lab Results  Component Value Date   RBC 4.25 07/07/2023   No results found for: "KPAFRELGTCHN", "LAMBDASER", "KAPLAMBRATIO" No results found for: "IGGSERUM", "IGA", "IGMSERUM" No results found for: "TOTALPROTELP", "ALBUMINELP", "A1GS", "A2GS", "BETS", "BETA2SER", "GAMS", "MSPIKE", "SPEI"  Chemistry      Component Value Date/Time   NA 145 01/20/2023 0954   K 5.2 (H) 01/20/2023 0954   CL 109 01/20/2023 0954   CO2 30 01/20/2023 0954   BUN 24 (H) 01/20/2023 0954   CREATININE 1.40 (H) 01/20/2023 0954      Component Value Date/Time   CALCIUM 10.1 01/20/2023 0954   ALKPHOS 40 01/20/2023 0954   AST 14 (L) 01/20/2023 0954   ALT 9 01/20/2023 0954   BILITOT 0.7 01/20/2023 0954     No diagnosis found.  Impression and Plan: Mr. Bonneau is a very pleasant 89 yo caucasian gentleman with history of thrombocytopenia since at least February 2017.   He is currently on observation for thrombocytopenia. In 2024 his platelet count was stable in the 40s. Now stable at 36. He is currently off of steroids per his provider at the [place]. No bleeding.  He continued to be off of the Imuran. His scantly elevated WBC is likely due to his prednisone use. No infections that he has noticed.   If surgery if needed in the future, Nplate and/or possible steroids could be considered.   Again we discussed red flags in regards to his thrombocytopenia.   Disposition RTC 3 months APP, labs ( CBC w/, CMP, LDH, smear, ferritin, iron, B12, retic, folate)-[place]   Rushie Chestnut, [place] 2/24/20259:45 AM

## 2023-07-08 ENCOUNTER — Encounter: Payer: Self-pay | Admitting: Medical Oncology

## 2023-07-15 DIAGNOSIS — D1801 Hemangioma of skin and subcutaneous tissue: Secondary | ICD-10-CM | POA: Diagnosis not present

## 2023-07-15 DIAGNOSIS — D045 Carcinoma in situ of skin of trunk: Secondary | ICD-10-CM | POA: Diagnosis not present

## 2023-07-15 DIAGNOSIS — L723 Sebaceous cyst: Secondary | ICD-10-CM | POA: Diagnosis not present

## 2023-07-15 DIAGNOSIS — D485 Neoplasm of uncertain behavior of skin: Secondary | ICD-10-CM | POA: Diagnosis not present

## 2023-07-15 DIAGNOSIS — L821 Other seborrheic keratosis: Secondary | ICD-10-CM | POA: Diagnosis not present

## 2023-07-15 DIAGNOSIS — L812 Freckles: Secondary | ICD-10-CM | POA: Diagnosis not present

## 2023-07-15 DIAGNOSIS — L570 Actinic keratosis: Secondary | ICD-10-CM | POA: Diagnosis not present

## 2023-07-15 DIAGNOSIS — Z85828 Personal history of other malignant neoplasm of skin: Secondary | ICD-10-CM | POA: Diagnosis not present

## 2023-07-15 DIAGNOSIS — D692 Other nonthrombocytopenic purpura: Secondary | ICD-10-CM | POA: Diagnosis not present

## 2023-07-15 DIAGNOSIS — L111 Transient acantholytic dermatosis [Grover]: Secondary | ICD-10-CM | POA: Diagnosis not present

## 2023-08-06 ENCOUNTER — Ambulatory Visit (INDEPENDENT_AMBULATORY_CARE_PROVIDER_SITE_OTHER): Payer: No Typology Code available for payment source

## 2023-08-06 DIAGNOSIS — I441 Atrioventricular block, second degree: Secondary | ICD-10-CM | POA: Diagnosis not present

## 2023-08-06 LAB — CUP PACEART REMOTE DEVICE CHECK
Battery Remaining Longevity: 44 mo
Battery Remaining Percentage: 38 %
Battery Voltage: 2.96 V
Brady Statistic AP VP Percent: 64 %
Brady Statistic AP VS Percent: 6.9 %
Brady Statistic AS VP Percent: 20 %
Brady Statistic AS VS Percent: 8.8 %
Brady Statistic RA Percent Paced: 69 %
Brady Statistic RV Percent Paced: 84 %
Date Time Interrogation Session: 20250326024903
Implantable Lead Connection Status: 753985
Implantable Lead Connection Status: 753985
Implantable Lead Implant Date: 20191226
Implantable Lead Implant Date: 20191226
Implantable Lead Location: 753859
Implantable Lead Location: 753860
Implantable Pulse Generator Implant Date: 20191226
Lead Channel Impedance Value: 490 Ohm
Lead Channel Impedance Value: 510 Ohm
Lead Channel Pacing Threshold Amplitude: 0.5 V
Lead Channel Pacing Threshold Amplitude: 0.5 V
Lead Channel Pacing Threshold Pulse Width: 0.5 ms
Lead Channel Pacing Threshold Pulse Width: 0.5 ms
Lead Channel Sensing Intrinsic Amplitude: 12 mV
Lead Channel Sensing Intrinsic Amplitude: 3.2 mV
Lead Channel Setting Pacing Amplitude: 0.75 V
Lead Channel Setting Pacing Amplitude: 2 V
Lead Channel Setting Pacing Pulse Width: 0.5 ms
Lead Channel Setting Sensing Sensitivity: 2 mV
Pulse Gen Model: 2272
Pulse Gen Serial Number: 9094310

## 2023-08-13 DIAGNOSIS — M5416 Radiculopathy, lumbar region: Secondary | ICD-10-CM | POA: Diagnosis not present

## 2023-08-19 DIAGNOSIS — Z79899 Other long term (current) drug therapy: Secondary | ICD-10-CM | POA: Diagnosis not present

## 2023-08-19 DIAGNOSIS — E785 Hyperlipidemia, unspecified: Secondary | ICD-10-CM | POA: Diagnosis not present

## 2023-08-19 DIAGNOSIS — R030 Elevated blood-pressure reading, without diagnosis of hypertension: Secondary | ICD-10-CM | POA: Diagnosis not present

## 2023-08-19 DIAGNOSIS — D849 Immunodeficiency, unspecified: Secondary | ICD-10-CM | POA: Diagnosis not present

## 2023-08-19 DIAGNOSIS — E039 Hypothyroidism, unspecified: Secondary | ICD-10-CM | POA: Diagnosis not present

## 2023-08-19 DIAGNOSIS — R7301 Impaired fasting glucose: Secondary | ICD-10-CM | POA: Diagnosis not present

## 2023-08-21 NOTE — Progress Notes (Unsigned)
 Subjective:    Patient ID: Joseph Hanna, male    DOB: [birth year], 89 y.o.   MRN: [id number]  HPI: Joseph Hanna is a 89 y.o. male who returns for follow up appointment for chronic pain and medication refill. He states his pain is located in his lower back and bilateral lower extremities with tingling and burning.  He rates his  pain 4.His  current exercise regime is going to [facility] three days a week, walking and  performing stretching exercises.  Joseph Hanna states he had a spinal injection with Dr Jena [facility],with good relief noted,  he will call his PCP and Orthopedist to see if they will prescribe his Lyrica. He was instructed to call office with response. He verbalizes understanding.   Joseph Hanna has left hand dressing on, he reports he was on his riding  lawnmower and when he passed a bush the linb scraped his hand. Dressing was removed, site was cleansed, no drainage or odor noted he refuses ED or Urgent Care evaluation, he states he will go see his PCP today.      Pain Inventory Average Pain 4 Pain Right Now 4 My pain is sharp and stabbing  In the last 24 hours, has pain interfered with the following? General activity 4 Relation with others 6 Enjoyment of life 2 What TIME of day is your pain at its worst? daytime Sleep (in general) Good  Pain is worse with: walking and bending Pain improves with: medication and injections Relief from Meds:  na  Family History  Problem Relation Age of Onset   Breast cancer Mother    Emphysema Father    Alzheimer's disease Brother    Social History   Socioeconomic History   Marital status: Married    Spouse name: Not on file   Number of children: 4   Years of education: Not on file   Highest education level: Not on file  Occupational History   Occupation: Retired-Salesman  Tobacco Use   Smoking status: Former    Current packs/day: 0.00    Types: Cigarettes    Start date: 12/02/1947    Quit date: 12/01/1953    Years since  quitting: 69.7   Smokeless tobacco: Never  Vaping Use   Vaping status: Never Used  Substance and Sexual Activity   Alcohol use: Not Currently    Alcohol/week: 7.0 standard drinks of alcohol    Types: 7 Glasses of wine per week    Comment: glass of wine several days per week   Drug use: No   Sexual activity: Not Currently  Other Topics Concern   Not on file  Social History Narrative   Not on file   Social Drivers of Health   Financial Resource Strain: Not on file  Food Insecurity: Not on file  Transportation Needs: Not on file  Physical Activity: Not on file  Stress: Not on file  Social Connections: Unknown (09/25/2021)   Received from [facility]   Social Network    Social Network: Not on file   Past Surgical History:  Procedure Laterality Date   bilateral knee meniscus surgery      CATARACT EXTRACTION W/ INTRAOCULAR LENS  IMPLANT, BILATERAL     COLONOSCOPY  FEB 2015   GREEN LIGHT LASER TURP (TRANSURETHRAL RESECTION OF PROSTATE N/A 08/10/2013   Procedure: GREEN LIGHT LASER TURP (TRANSURETHRAL RESECTION OF PROSTATE;  Surgeon: Antony Haste, MD;  Location: [facility];  Service: Urology;  Laterality:  N/A;   LUMBAR LAMINECTOMY  1990   MOHS SURGERY     x 2   NEPHRECTOMY Left 2003   ORIF CLAVICULAR FRACTURE  12/03/2011   Procedure: OPEN REDUCTION INTERNAL FIXATION (ORIF) CLAVICULAR FRACTURE;  Surgeon: Mable Paris, MD;  Location: [facility];  Service: Orthopedics;  Laterality: Right;   PACEMAKER IMPLANT N/A 05/07/2018    [employer] MRI conditional  dual-chamber pacemaker by Dr Johney Frame for mobitz II second degree AV block   right shoulder surgery      done at [facility]    SHOULDER ARTHROSCOPY Left LEFT  2012   TONSILLECTOMY  as child   TRABECULECTOMY  2011   left eye (glaucoma)   TRANSURETHRAL RESECTION OF PROSTATE N/A 07/24/2018   Procedure: TRANSURETHRAL RESECTION OF THE PROSTATE (TURP);  Surgeon:  Jerilee Field, MD;  Location: [facility];  Service: Urology;  Laterality: N/A;   Past Surgical History:  Procedure Laterality Date   bilateral knee meniscus surgery      CATARACT EXTRACTION W/ INTRAOCULAR LENS  IMPLANT, BILATERAL     COLONOSCOPY  FEB 2015   GREEN LIGHT LASER TURP (TRANSURETHRAL RESECTION OF PROSTATE N/A 08/10/2013   Procedure: GREEN LIGHT LASER TURP (TRANSURETHRAL RESECTION OF PROSTATE;  Surgeon: Antony Haste, MD;  Location: [facility];  Service: Urology;  Laterality: N/A;   LUMBAR LAMINECTOMY  1990   MOHS SURGERY     x 2   NEPHRECTOMY Left 2003   ORIF CLAVICULAR FRACTURE  12/03/2011   Procedure: OPEN REDUCTION INTERNAL FIXATION (ORIF) CLAVICULAR FRACTURE;  Surgeon: Mable Paris, MD;  Location: [facility];  Service: Orthopedics;  Laterality: Right;   PACEMAKER IMPLANT N/A 05/07/2018    St Jude Medical Assurity MRI conditional  dual-chamber pacemaker by Dr Johney Frame for mobitz II second degree AV block   right shoulder surgery      done at [facility]    SHOULDER ARTHROSCOPY Left LEFT  2012   TONSILLECTOMY  as child   TRABECULECTOMY  2011   left eye (glaucoma)   TRANSURETHRAL RESECTION OF PROSTATE N/A 07/24/2018   Procedure: TRANSURETHRAL RESECTION OF THE PROSTATE (TURP);  Surgeon: Jerilee Field, MD;  Location: [facility];  Service: Urology;  Laterality: N/A;   Past Medical History:  Diagnosis Date   Arthritis    BPH (benign prostatic hypertrophy)    Cancer (HCC)    kidney , skin cancer    CKD (chronic kidney disease)    Clavicle fracture    Right   ED (erectile dysfunction) of organic origin    Fatty liver 2020   [place]   Gallstones 2020   [place]   GERD (gastroesophageal reflux disease)    Glaucoma    bilateral --  right uses rx drops and left eye trabeculectomy 2011   Grade I diastolic dysfunction 04/21/2018   Noted on ECHO   History of bradycardia    History of gastroesophageal reflux (GERD)    History of left bundle  branch block (LBBB) 05/05/2018   Noted on EKG   History of renal cell cancer    S/P  LEFT NEPHRECTOMY  2003--  no recurrence   History of ulcerative colitis    in remission   Hyperlipidemia    Hypothyroidism    Keratosis, actinic    Lower leg pain    pt states has fibula-tibula syndrome--  goes to physical therapy   LVH (left ventricular hypertrophy) 04/21/2018   Mode, noted on ECHO  Mobitz II 05/05/2018   Noted on EKG   Presence of permanent cardiac pacemaker    Rosacea    Thrombocytopenia (HCC)    TIA (transient ischemic attack) 06/20/2015   TIA (transient ischemic attack)    2017    BP (!) 173/84   Pulse 67   Ht 6\' 2"  (1.88 m)   Wt 172 lb (78 kg)   SpO2 97%   BMI 22.08 kg/m   Opioid Risk Score:   Fall Risk Score:  `1  Depression screen [facility] 2/9     08/22/2023   10:44 AM 03/18/2023   10:17 AM 01/30/2023    9:24 AM 11/29/2022    9:37 AM 07/23/2022   10:56 AM 06/11/2022   11:41 AM 03/12/2022   11:42 AM  Depression screen PHQ 2/9  Decreased Interest 0 0 0 0 3 0 0  Down, Depressed, Hopeless 0 0 0 0 1 0 0  PHQ - 2 Score 0 0 0 0 4 0 0    Review of Systems  Musculoskeletal:  Positive for back pain.  All other systems reviewed and are negative.      Objective:   Physical Exam Vitals and nursing note reviewed.  Constitutional:      Appearance: Normal appearance.  Cardiovascular:     Rate and Rhythm: Normal rate and regular rhythm.     Pulses: Normal pulses.     Heart sounds: Normal heart sounds.  Pulmonary:     Effort: Pulmonary effort is normal.     Breath sounds: Normal breath sounds.  Musculoskeletal:     Comments: Normal Muscle Bulk and Muscle Testing Reveals:  Upper Extremities: ROM and Muscle Strength : Full ROM and Muscle Strength 5/5 Lower Extremities: Full ROM and Muscle Strength 5/5 Arises from Table slowly Narrow Based  Gait     Skin:    General: Skin is warm and dry.  Neurological:     General: No focal deficit present.     Mental Status:  He is alert and oriented to person, place, and time.  Psychiatric:        Mood and Affect: Mood normal.        Behavior: Behavior normal.         Assessment & Plan:  Lumbar Stenosis with neurogenic claudication: Continue HEP as Tolerated. Continue current medication regimen.  Laceration of skin let hand: Site cleansed, he refuses Ed or Urgent Care evaluation. He states he will see his PCP today. Dressing applied.  Chronic Pain Syndrome: Refilled Pregabalin 75 mg twice a day #60. Joseph Hanna states he would like or [place] to resume Preg[facility]alin, [facility]. Continue to Monitor.   F/U in 6 months

## 2023-08-22 ENCOUNTER — Encounter (HOSPITAL_COMMUNITY): Payer: Self-pay | Admitting: *Deleted

## 2023-08-22 ENCOUNTER — Encounter: Payer: Medicare HMO | Attending: Registered Nurse | Admitting: Registered Nurse

## 2023-08-22 ENCOUNTER — Other Ambulatory Visit: Payer: Self-pay

## 2023-08-22 ENCOUNTER — Encounter: Payer: Self-pay | Admitting: Registered Nurse

## 2023-08-22 ENCOUNTER — Ambulatory Visit (HOSPITAL_COMMUNITY)
Admission: EM | Admit: 2023-08-22 | Discharge: 2023-08-22 | Disposition: A | Discharge status: Home / Self Care | Attending: Family Medicine | Admitting: Family Medicine

## 2023-08-22 VITALS — BP 136/72 | HR 67 | Ht 74.0 in | Wt 172.0 lb

## 2023-08-22 DIAGNOSIS — S61412A Laceration without foreign body of left hand, initial encounter: Secondary | ICD-10-CM | POA: Diagnosis not present

## 2023-08-22 DIAGNOSIS — G894 Chronic pain syndrome: Secondary | ICD-10-CM | POA: Diagnosis not present

## 2023-08-22 DIAGNOSIS — S61412D Laceration without foreign body of left hand, subsequent encounter: Secondary | ICD-10-CM | POA: Diagnosis not present

## 2023-08-22 DIAGNOSIS — M48062 Spinal stenosis, lumbar region with neurogenic claudication: Secondary | ICD-10-CM | POA: Diagnosis not present

## 2023-08-22 MED ORDER — PREGABALIN 75 MG PO CAPS: 75.0000 mg | ORAL_CAPSULE | Freq: Two times a day (BID) | ORAL | 5 refills | Status: DC

## 2023-08-22 MED ORDER — DOXYCYCLINE HYCLATE 100 MG PO CAPS
100.0000 mg | ORAL_CAPSULE | Freq: Two times a day (BID) | ORAL | 0 refills | Status: AC
Start: 2023-08-22 — End: ?

## 2023-08-22 NOTE — ED Triage Notes (Signed)
 PT was mowing his lawn  2 Days ago on his ridding lawnmower. Pt cut the back of his hand on a branch .

## 2023-08-22 NOTE — ED Provider Notes (Signed)
 [facility]  Note:  This document was prepared using Dragon voice recognition software and may include unintentional dictation errors.  MRN: [id number] DOB: 03/01/1933  Subjective:   Joseph Hanna is a 89 y.o. male presenting for a laceration to the dorsum of the left hand that occurred 2 days ago while mowing.  Patient reports while riding his riding lawnmower a branch sticking out penetrated under the skin of the back of his hand causing a skin tear.  Patient reports he cleaned and bandaged the area but is still having bleeding.  Patient was seen earlier today by another physician in their office who recommended he come to urgent care for evaluation and possible need for antibiotics due to redness and swelling to the back of the hand.  Patient denies any severe pain or fever.  States it is still bleeding and he cannot control the bleeding.  Patient has bandage in place at arrival to urgent care bleeding moderately well-controlled.  Moderate erythema, swelling, mild warmth noted.  No current facility-administered medications for this encounter.  Current Outpatient Medications:    doxycycline (VIBRAMYCIN) 100 MG capsule, Take 1 capsule (100 mg total) by mouth 2 (two) times daily., Disp: 20 capsule, Rfl: 0   levothyroxine (SYNTHROID, LEVOTHROID) 50 MCG tablet, Take 50-100 mcg by mouth daily before breakfast. Take T, Th, S,and S.  Takes 100 mcg M,W,and F., Disp: , Rfl:    pregabalin (LYRICA) 75 MG capsule, Take 1 capsule (75 mg total) by mouth 2 (two) times daily., Disp: 60 capsule, Rfl: 5   acetaminophen (TYLENOL) 500 MG tablet, Take 500 mg by mouth 2 (two) times daily., Disp: , Rfl:    Calcium Carb-Cholecalciferol 600-10 MG-MCG TABS, Take by mouth., Disp: , Rfl:    Cholecalciferol 50 MCG (2000 UT) TABS, Take by mouth., Disp: , Rfl:    clotrimazole (LOTRIMIN) 1 % external solution, Apply topically., Disp: , Rfl:    Coenzyme Q10 (COQ10) 200 MG CAPS, Take 1 capsule by mouth  daily., Disp: , Rfl:    cycloSPORINE (RESTASIS) 0.05 % ophthalmic emulsion, Apply to eye., Disp: , Rfl:    dorzolamide (TRUSOPT) 2 % ophthalmic solution, Apply to eye., Disp: , Rfl:    finasteride (PROSCAR) 5 MG tablet, Take 5 mg by mouth daily., Disp: , Rfl:    Ginkgo Biloba 40 MG TABS, Take 120 mg by mouth daily. , Disp: , Rfl:    mesalamine (APRISO) 0.375 g 24 hr capsule, 4 caps orally once a day, Disp: , Rfl:    mesalamine (CANASA) 1000 MG suppository, Place 1,000 mg rectally at bedtime., Disp: , Rfl:    mesalamine (LIALDA) 1.2 g EC tablet, Take by mouth., Disp: , Rfl:    metroNIDAZOLE (METROGEL) 1 % gel, 1 application a thin layer to affected area Externally prn rosacea, Disp: , Rfl:    Multiple Vitamin (MULTIVITAMIN ADULT PO), SMARTSIG:1 By Mouth, Disp: , Rfl:    Netarsudil-Latanoprost 0.02-0.005 % SOLN, Apply 1 drop to eye at bedtime. Right eye., Disp: , Rfl:    niacin (SLO-NIACIN) 500 MG tablet, Take 500 mg by mouth at bedtime., Disp: , Rfl:    simvastatin (ZOCOR) 20 MG tablet, Take 10 mg by mouth daily. TAKES QHS, Disp: , Rfl:    Allergies  Allergen Reactions   Itraconazole Rash   Other Rash   Atorvastatin Other (See Comments)    Other reaction(s): muscle and joint aches  Other Reaction(s): muscle aches   Beta Adrenergic Blockers Other (See Comments)  Patient doesn't know reaction   Irbesartan Other (See Comments)    Patient doesn't know reaction    Terbinafine Rash    Past Medical History:  Diagnosis Date   Arthritis    BPH (benign prostatic hypertrophy)    Cancer (HCC)    kidney , skin cancer    CKD (chronic kidney disease)    Clavicle fracture    Right   ED (erectile dysfunction) of organic origin    Fatty liver 2020   [place]   Gallstones 2020   [place]   GERD (gastroesophageal reflux disease)    Glaucoma    bilateral --  right uses rx drops and left eye trabeculectomy 2011   Grade I diastolic dysfunction 04/21/2018   Noted on ECHO   History of bradycardia     History of gastroesophageal reflux (GERD)    History of left bundle branch block (LBBB) 05/05/2018   Noted on EKG   History of renal cell cancer    S/P  LEFT NEPHRECTOMY  2003--  no recurrence   History of ulcerative colitis    in remission   Hyperlipidemia    Hypothyroidism    Keratosis, actinic    Lower leg pain    pt states has fibula-tibula syndrome--  goes to physical therapy   LVH (left ventricular hypertrophy) 04/21/2018   Mode, noted on ECHO   Mobitz II 05/05/2018   Noted on EKG   Presence of permanent cardiac pacemaker    Rosacea    Thrombocytopenia (HCC)    TIA (transient ischemic attack) 06/20/2015   TIA (transient ischemic attack)    2017      Past Surgical History:  Procedure Laterality Date   bilateral knee meniscus surgery      CATARACT EXTRACTION W/ INTRAOCULAR LENS  IMPLANT, BILATERAL     COLONOSCOPY  FEB 2015   GREEN LIGHT LASER TURP (TRANSURETHRAL RESECTION OF PROSTATE N/A 08/10/2013   Procedure: GREEN LIGHT LASER TURP (TRANSURETHRAL RESECTION OF PROSTATE;  Surgeon: Antony Haste, MD;  Location: [facility];  Service: Urology;  Laterality: N/A;   LUMBAR LAMINECTOMY  1990   MOHS SURGERY     x 2   NEPHRECTOMY Left 2003   ORIF CLAVICULAR FRACTURE  12/03/2011   Procedure: OPEN REDUCTION INTERNAL FIXATION (ORIF) CLAVICULAR FRACTURE;  Surgeon: Mable Paris, MD;  Location: [facility];  Service: Orthopedics;  Laterality: Right;   PACEMAKER IMPLANT N/A 05/07/2018    [employer] MRI conditional  dual-chamber pacemaker by Dr Johney Frame for mobitz II second degree AV block   right shoulder surgery      done at [facility]    SHOULDER ARTHROSCOPY Left LEFT  2012   TONSILLECTOMY  as child   TRABECULECTOMY  2011   left eye (glaucoma)   TRANSURETHRAL RESECTION OF PROSTATE N/A 07/24/2018   Procedure: TRANSURETHRAL RESECTION OF THE PROSTATE (TURP);  Surgeon: Jerilee Field, MD;  Location: [facility];  Service: Urology;   Laterality: N/A;    Family History  Problem Relation Age of Onset   Breast cancer Mother    Emphysema Father    Alzheimer's disease Brother     Social History   Tobacco Use   Smoking status: Former    Current packs/day: 0.00    Types: Cigarettes    Start date: 12/02/1947    Quit date: 12/01/1953    Years since quitting: 69.7   Smokeless tobacco: Never  Vaping Use   Vaping status: Never Used  Substance Use Topics   Alcohol use: Not Currently    Alcohol/week: 7.0 standard drinks of alcohol    Types: 7 Glasses of wine per week    Comment: glass of wine several days per week   Drug use: No    ROS Refer to HPI for ROS details.  Objective:   Vitals: BP (!) 166/77   Pulse 61   Temp 98.3 F (36.8 C)   Resp 20   SpO2 97%   Physical Exam Vitals and nursing note reviewed.  Constitutional:      General: He is not in acute distress.    Appearance: Normal appearance. He is well-developed. He is not ill-appearing or toxic-appearing.  HENT:     Head: Normocephalic and atraumatic.  Cardiovascular:     Rate and Rhythm: Normal rate.  Pulmonary:     Effort: Pulmonary effort is normal. No respiratory distress.  Skin:    General: Skin is warm and dry.     Capillary Refill: Capillary refill takes less than 2 seconds.     Findings: Erythema, laceration and wound (2 cm nonlinear laceration/skin tear to dorsum of left hand, mild bleeding, moderate erythema, mild swelling, mild warmth.  Moderate concern for secondary infection, wound not well-approximated unable to approximate due to length of time since injury.) present.  Neurological:     General: No focal deficit present.     Mental Status: He is alert and oriented to person, place, and time.  Psychiatric:        Mood and Affect: Mood normal.    Procedures  No results found for this or any previous visit (from the past 24 hours).  Assessment and Plan :   1. Laceration of left hand without foreign body, initial encounter  (Primary) - Wound care (Clean Wound) completed in UC with normal saline and gauze prior to dressing change. - doxycycline (VIBRAMYCIN) 100 MG capsule; Take 1 capsule (100 mg total) by mouth 2 (two) times daily.  Dispense: 20 capsule; Refill: 0 - Apply dressing in UC for protection and infection prevention until wound heals. -Keep current bandage in place for 24 hours after which you may remove and apply antibiotic ointment and replace bandage if necessary. -Continue to monitor for worsening severity of infection such as increased redness, increased swelling, increased pain, purulent discharge, fever. -Continue to monitor symptoms for any change in severity if there is any escalation of current symptoms or development of new symptoms follow-up in ER for further evaluation and management.  [place]   [place], [place], [place] 08/22/23 1624

## 2023-08-22 NOTE — Discharge Instructions (Addendum)
 1. Laceration of left hand without foreign body, initial encounter (Primary) - Wound care (Clean Wound) completed in UC with normal saline and gauze prior to dressing change. - doxycycline (VIBRAMYCIN) 100 MG capsule; Take 1 capsule (100 mg total) by mouth 2 (two) times daily.  Dispense: 20 capsule; Refill: 0 - Apply dressing in UC for protection and infection prevention until wound heals. -Keep current bandage in place for 24 hours after which you may remove and apply antibiotic ointment and replace bandage if necessary. -Continue to monitor for worsening severity of infection such as increased redness, increased swelling, increased pain, purulent discharge, fever. -Continue to monitor symptoms for any change in severity if there is any escalation of current symptoms or development of new symptoms follow-up in ER for further evaluation and management.

## 2023-08-25 ENCOUNTER — Telehealth: Payer: Self-pay | Admitting: Registered Nurse

## 2023-08-25 NOTE — Telephone Encounter (Signed)
 Patient came to office to notify you Lyrica had been ordered by another doctor that you could cancel your prescription that was sent.

## 2023-08-25 NOTE — Telephone Encounter (Signed)
 [facility] was called: Dr Eveleen Hinds is prescribing Mr. Sayres Pregabalin, this provider prescription was removed from profile.

## 2023-09-18 NOTE — Progress Notes (Signed)
 Remote pacemaker transmission.

## 2023-09-23 DIAGNOSIS — I70219 Atherosclerosis of native arteries of extremities with intermittent claudication, unspecified extremity: Secondary | ICD-10-CM | POA: Diagnosis not present

## 2023-09-23 DIAGNOSIS — I700 Atherosclerosis of aorta: Secondary | ICD-10-CM | POA: Diagnosis not present

## 2023-09-23 DIAGNOSIS — Z87891 Personal history of nicotine dependence: Secondary | ICD-10-CM | POA: Diagnosis not present

## 2023-09-23 DIAGNOSIS — C649 Malignant neoplasm of unspecified kidney, except renal pelvis: Secondary | ICD-10-CM | POA: Diagnosis not present

## 2023-09-23 DIAGNOSIS — E039 Hypothyroidism, unspecified: Secondary | ICD-10-CM | POA: Diagnosis not present

## 2023-09-23 DIAGNOSIS — I771 Stricture of artery: Secondary | ICD-10-CM | POA: Diagnosis not present

## 2023-09-23 DIAGNOSIS — E785 Hyperlipidemia, unspecified: Secondary | ICD-10-CM | POA: Diagnosis not present

## 2023-09-23 DIAGNOSIS — R32 Unspecified urinary incontinence: Secondary | ICD-10-CM | POA: Diagnosis not present

## 2023-09-23 DIAGNOSIS — I509 Heart failure, unspecified: Secondary | ICD-10-CM | POA: Diagnosis not present

## 2023-09-23 DIAGNOSIS — D6949 Other primary thrombocytopenia: Secondary | ICD-10-CM | POA: Diagnosis not present

## 2023-09-23 DIAGNOSIS — M0550 Rheumatoid polyneuropathy with rheumatoid arthritis of unspecified site: Secondary | ICD-10-CM | POA: Diagnosis not present

## 2023-09-23 DIAGNOSIS — M1990 Unspecified osteoarthritis, unspecified site: Secondary | ICD-10-CM | POA: Diagnosis not present

## 2023-10-07 ENCOUNTER — Inpatient Hospital Stay (HOSPITAL_BASED_OUTPATIENT_CLINIC_OR_DEPARTMENT_OTHER): Payer: Medicare HMO | Admitting: Medical Oncology

## 2023-10-07 ENCOUNTER — Inpatient Hospital Stay: Payer: Medicare HMO | Attending: Medical Oncology

## 2023-10-07 ENCOUNTER — Encounter: Payer: Self-pay | Admitting: Medical Oncology

## 2023-10-07 VITALS — BP 128/55 | HR 60 | Temp 97.6°F | Resp 18 | Ht 74.0 in | Wt 171.0 lb

## 2023-10-07 DIAGNOSIS — K5190 Ulcerative colitis, unspecified, without complications: Secondary | ICD-10-CM | POA: Insufficient documentation

## 2023-10-07 DIAGNOSIS — Z7952 Long term (current) use of systemic steroids: Secondary | ICD-10-CM | POA: Insufficient documentation

## 2023-10-07 DIAGNOSIS — K719 Toxic liver disease, unspecified: Secondary | ICD-10-CM | POA: Diagnosis not present

## 2023-10-07 DIAGNOSIS — D6959 Other secondary thrombocytopenia: Secondary | ICD-10-CM | POA: Diagnosis not present

## 2023-10-07 DIAGNOSIS — D72829 Elevated white blood cell count, unspecified: Secondary | ICD-10-CM | POA: Insufficient documentation

## 2023-10-07 DIAGNOSIS — T451X5A Adverse effect of antineoplastic and immunosuppressive drugs, initial encounter: Secondary | ICD-10-CM | POA: Insufficient documentation

## 2023-10-07 DIAGNOSIS — D696 Thrombocytopenia, unspecified: Secondary | ICD-10-CM

## 2023-10-07 DIAGNOSIS — Z8719 Personal history of other diseases of the digestive system: Secondary | ICD-10-CM

## 2023-10-07 LAB — CMP (CANCER CENTER ONLY)
ALT: 13 U/L (ref 0–44)
AST: 15 U/L (ref 15–41)
Albumin: 4.8 g/dL (ref 3.5–5.0)
Alkaline Phosphatase: 38 U/L (ref 38–126)
Anion gap: 8 (ref 5–15)
BUN: 21 mg/dL (ref 8–23)
CO2: 30 mmol/L (ref 22–32)
Calcium: 9.8 mg/dL (ref 8.9–10.3)
Chloride: 106 mmol/L (ref 98–111)
Creatinine: 1.53 mg/dL — ABNORMAL HIGH (ref 0.61–1.24)
GFR, Estimated: 43 mL/min — ABNORMAL LOW (ref >60)
Glucose, Bld: 133 mg/dL — ABNORMAL HIGH (ref 70–99)
Potassium: 4.3 mmol/L (ref 3.5–5.1)
Sodium: 144 mmol/L (ref 135–145)
Total Bilirubin: 0.7 mg/dL (ref 0.0–1.2)
Total Protein: 6.9 g/dL (ref 6.5–8.1)

## 2023-10-07 LAB — CBC WITH DIFFERENTIAL (CANCER CENTER ONLY)
Abs Immature Granulocytes: 0.2 10*3/uL — ABNORMAL HIGH (ref 0.00–0.07)
Basophils Absolute: 0 10*3/uL (ref 0.0–0.1)
Basophils Relative: 0 %
Eosinophils Absolute: 0 10*3/uL (ref 0.0–0.5)
Eosinophils Relative: 0 %
HCT: 44.5 % (ref 39.0–52.0)
Hemoglobin: 13.8 g/dL (ref 13.0–17.0)
Immature Granulocytes: 2 %
Lymphocytes Relative: 10 %
Lymphs Abs: 1.1 10*3/uL (ref 0.7–4.0)
MCH: 30.9 pg (ref 26.0–34.0)
MCHC: 31 g/dL (ref 30.0–36.0)
MCV: 99.8 fL (ref 80.0–100.0)
Monocytes Absolute: 2.3 10*3/uL — ABNORMAL HIGH (ref 0.1–1.0)
Monocytes Relative: 21 %
Neutro Abs: 7.1 10*3/uL (ref 1.7–7.7)
Neutrophils Relative %: 67 %
Platelet Count: 41 10*3/uL — ABNORMAL LOW (ref 150–400)
RBC: 4.46 MIL/uL (ref 4.22–5.81)
RDW: 16 % — ABNORMAL HIGH (ref 11.5–15.5)
Smear Review: NORMAL
WBC Count: 10.7 10*3/uL — ABNORMAL HIGH (ref 4.0–10.5)
nRBC: 0 % (ref 0.0–0.2)

## 2023-10-07 LAB — IRON AND IRON BINDING CAPACITY (CC-WL,HP ONLY)
Iron: 108 ug/dL (ref 45–182)
Saturation Ratios: 29 % (ref 17.9–39.5)
TIBC: 377 ug/dL (ref 250–450)
UIBC: 269 ug/dL (ref 117–376)

## 2023-10-07 LAB — LACTATE DEHYDROGENASE: LDH: 182 U/L (ref 98–192)

## 2023-10-07 LAB — RETIC PANEL
Immature Retic Fract: 15.1 % (ref 2.3–15.9)
RBC.: 4.42 MIL/uL (ref 4.22–5.81)
Retic Count, Absolute: 77.8 10*3/uL (ref 19.0–186.0)
Retic Ct Pct: 1.8 % (ref 0.4–3.1)
Reticulocyte Hemoglobin: 32.6 pg (ref >27.9)

## 2023-10-07 LAB — SAVE SMEAR(SSMR), FOR PROVIDER SLIDE REVIEW

## 2023-10-07 LAB — FERRITIN: Ferritin: 51 ng/mL (ref 24–336)

## 2023-10-07 LAB — FOLATE: Folate: >40 ng/mL (ref >5.9)

## 2023-10-07 LAB — VITAMIN B12: Vitamin B-12: 638 pg/mL (ref 180–914)

## 2023-10-07 NOTE — Progress Notes (Signed)
 Hematology and Oncology Follow Up Visit  Joseph Hanna [id number] Oct 26, 1932 89 y.o. 10/07/2023   Principle Diagnosis:  Thrombocytopenia secondary to underlying liver disease -previously on Imuran  (for UC)  Current Therapy:   Observation   Interim History:  Joseph Hanna is here today for follow-up.    Today he states that he has been well since his last visit in Feb 2025. He had his second injection about 1 month ago for his back pain at L4. So far this is working well.   There has been no bleeding to his knowledge: denies epistaxis, gingivitis, hemoptysis, hematemesis, hematuria, melena, excessive bruising, blood donation.   No recent flareup of his ulcerative colitis.  Has been off of the Imuran . This was stopped due to his age at the recommendation of his new UC provider. Symptoms are ok off of this medication.    He continues to take his prednisone  5 mg once daily with breakfast- this was added after his last visit here with us . He is tolerating it well.   He is not having any chest pain or shortness of breath. No fevers or unintentional weight loss.   Weight and appetite are stable.   Overall, I would have said that his performance status is probably ECOG 2.   Wt Readings from Last 3 Encounters:  10/07/23 171 lb (77.6 kg)  08/22/23 172 lb (78 kg)  07/07/23 171 lb (77.6 kg)   Medications:  Allergies as of 10/07/2023       Reactions   Itraconazole Rash   Other Rash   Atorvastatin Other (See Comments)   Other reaction(s): muscle and joint aches Other Reaction(s): muscle aches   Beta Adrenergic Blockers Other (See Comments)   Patient doesn't know reaction   Irbesartan Other (See Comments)   Patient doesn't know reaction    Terbinafine Rash        Medication List        Accurate as of Oct 07, 2023 11:27 AM. If you have any questions, ask your nurse or doctor.          STOP taking these medications    acetaminophen  500 MG tablet Commonly known as:  TYLENOL  Stopped by: Sharla Davis       TAKE these medications    azaTHIOprine  50 MG tablet Commonly known as: IMURAN  Take 50 mg by mouth daily.   Calcium  Carb-Cholecalciferol  600-10 MG-MCG Tabs Take by mouth.   Cholecalciferol  50 MCG (2000 UT) Tabs Take by mouth.   clotrimazole 1 % external solution Commonly known as: LOTRIMIN Apply topically.   CoQ10 200 MG Caps Take 1 capsule by mouth daily.   cycloSPORINE 0.05 % ophthalmic emulsion Commonly known as: RESTASIS Apply to eye.   dorzolamide  2 % ophthalmic solution Commonly known as: TRUSOPT  Apply to eye.   doxycycline  100 MG capsule Commonly known as: VIBRAMYCIN  Take 1 capsule (100 mg total) by mouth 2 (two) times daily.   finasteride  5 MG tablet Commonly known as: PROSCAR  Take 5 mg by mouth daily.   fluoruracil 0.5 % cream Commonly known as: CARAC  Apply 1 Application topically daily.   Ginkgo Biloba 40 MG Tabs Take 120 mg by mouth daily.   latanoprost  0.005 % ophthalmic solution Commonly known as: XALATAN  Place 1 drop into the right eye at bedtime.   levothyroxine  50 MCG tablet Commonly known as: SYNTHROID  Take 50-100 mcg by mouth daily before breakfast. Take 50mcg T, Th, S,and S.  Takes 100 mcg M,W,and F.   Apriso  0.375  g 24 hr capsule Generic drug: mesalamine  4 caps orally once a day   mesalamine  1.2 g EC tablet Commonly known as: LIALDA  Take by mouth.   mesalamine  1000 MG suppository Commonly known as: CANASA  Place 1,000 mg rectally at bedtime.   Metrogel  1 % gel Generic drug: metroNIDAZOLE  1 application a thin layer to affected area Externally prn rosacea   MULTIVITAMIN ADULT PO SMARTSIG:1 By Mouth   Netarsudil-Latanoprost  0.02-0.005 % Soln Apply 1 drop to eye at bedtime. Right eye.   niacin  500 MG tablet Commonly known as: (VITAMIN B3) Take 500 mg by mouth at bedtime.   predniSONE  5 MG tablet Commonly known as: DELTASONE  Take 5 mg by mouth every morning.   pregabalin  75  MG capsule Commonly known as: LYRICA  Take 75 mg by mouth 2 (two) times daily.   simvastatin  20 MG tablet Commonly known as: ZOCOR  Take 10 mg by mouth daily. TAKES QHS        Allergies:  Allergies  Allergen Reactions   Itraconazole Rash   Other Rash   Atorvastatin Other (See Comments)    Other reaction(s): muscle and joint aches  Other Reaction(s): muscle aches   Beta Adrenergic Blockers Other (See Comments)    Patient doesn't know reaction   Irbesartan Other (See Comments)    Patient doesn't know reaction    Terbinafine Rash    Past Medical History, Surgical history, Social history, and Family History were reviewed and updated.  Review of Systems: Review of Systems  Constitutional: Negative.   HENT: Negative.    Eyes: Negative.   Respiratory: Negative.    Cardiovascular: Negative.   Gastrointestinal: Negative.   Genitourinary: Negative.   Musculoskeletal:  Negative for back pain.  Skin: Negative.   Neurological: Negative.   Endo/Heme/Allergies:  Bruises/bleeds easily.  Psychiatric/Behavioral: Negative.      Physical Exam:  height is 6\' 2"  (1.88 m) and weight is 171 lb (77.6 kg). His oral temperature is 97.6 F (36.4 C). His blood pressure is 128/55 (abnormal) and his pulse is 60. His respiration is 18 and oxygen saturation is 100%.   Wt Readings from Last 3 Encounters:  10/07/23 171 lb (77.6 kg)  08/22/23 172 lb (78 kg)  07/07/23 171 lb (77.6 kg)    Physical Exam Vitals reviewed.  HENT:     Head: Normocephalic and atraumatic.  Eyes:     Pupils: Pupils are equal, round, and reactive to light.  Pulmonary:     Effort: Pulmonary effort is normal. No respiratory distress.  Musculoskeletal:        General: No deformity. Normal range of motion.  Lymphadenopathy:     Cervical: No cervical adenopathy.  Skin:    General: Skin is warm and dry.     Findings: Bruising present. No erythema or rash.  Neurological:     Mental Status: He is alert and oriented to  person, place, and time.  Psychiatric:        Behavior: Behavior normal.        Thought Content: Thought content normal.        Judgment: Judgment normal.    Lab Results  Component Value Date   WBC 11.9 (H) 07/07/2023   HGB 13.3 07/07/2023   HCT 42.4 07/07/2023   MCV 99.8 07/07/2023   PLT 36 (L) 07/07/2023   Lab Results  Component Value Date   FERRITIN 33 07/07/2023   IRON 93 07/07/2023   TIBC 389 07/07/2023   UIBC 296 07/07/2023   IRONPCTSAT 24  07/07/2023   Lab Results  Component Value Date   RETICCTPCT 1.8 10/07/2023   RBC 4.42 10/07/2023   No results found for: "KPAFRELGTCHN", "LAMBDASER", "KAPLAMBRATIO" No results found for: "IGGSERUM", "IGA", "IGMSERUM" No results found for: "TOTALPROTELP", "ALBUMINELP", "A1GS", "A2GS", "BETS", "BETA2SER", "GAMS", "MSPIKE", "SPEI"   Chemistry      Component Value Date/Time   NA 144 10/07/2023 1031   K 4.3 10/07/2023 1031   CL 106 10/07/2023 1031   CO2 30 10/07/2023 1031   BUN 21 10/07/2023 1031   CREATININE 1.53 (H) 10/07/2023 1031      Component Value Date/Time   CALCIUM  9.8 10/07/2023 1031   ALKPHOS 38 10/07/2023 1031   AST 15 10/07/2023 1031   ALT 13 10/07/2023 1031   BILITOT 0.7 10/07/2023 1031     Encounter Diagnoses  Name Primary?   Thrombocytopenia (HCC) Yes   History of ulcerative colitis    Leukocytosis, unspecified type     Impression and Plan: Joseph Hanna is a very pleasant 89 yo caucasian gentleman with history of thrombocytopenia since at least February 2017. He also has mild leukocytosis suspected to be due to his prednisone  use.   He is currently on observation for thrombocytopenia.  Platelets are stable in the 40s- no bleeding  If surgery if needed in the future, Nplate and/or possible steroids could be considered.   Again we discussed red flags in regards to his thrombocytopenia.   Disposition RTC 6 months APP, labs ( CBC w/, CMP, LDH, smear, ferritin, iron, B12, retic, folate)-[place]   Sharla Davis, PA-C 5/27/202511:27 AM

## 2023-10-08 ENCOUNTER — Ambulatory Visit: Payer: Self-pay | Admitting: Medical Oncology

## 2023-10-21 DIAGNOSIS — L570 Actinic keratosis: Secondary | ICD-10-CM | POA: Diagnosis not present

## 2023-10-21 DIAGNOSIS — D1801 Hemangioma of skin and subcutaneous tissue: Secondary | ICD-10-CM | POA: Diagnosis not present

## 2023-10-21 DIAGNOSIS — H35372 Puckering of macula, left eye: Secondary | ICD-10-CM | POA: Diagnosis not present

## 2023-10-21 DIAGNOSIS — Z85828 Personal history of other malignant neoplasm of skin: Secondary | ICD-10-CM | POA: Diagnosis not present

## 2023-10-21 DIAGNOSIS — H401133 Primary open-angle glaucoma, bilateral, severe stage: Secondary | ICD-10-CM | POA: Diagnosis not present

## 2023-10-21 DIAGNOSIS — D485 Neoplasm of uncertain behavior of skin: Secondary | ICD-10-CM | POA: Diagnosis not present

## 2023-10-21 DIAGNOSIS — L853 Xerosis cutis: Secondary | ICD-10-CM | POA: Diagnosis not present

## 2023-10-21 DIAGNOSIS — H35033 Hypertensive retinopathy, bilateral: Secondary | ICD-10-CM | POA: Diagnosis not present

## 2023-10-21 DIAGNOSIS — L821 Other seborrheic keratosis: Secondary | ICD-10-CM | POA: Diagnosis not present

## 2023-10-21 DIAGNOSIS — L603 Nail dystrophy: Secondary | ICD-10-CM | POA: Diagnosis not present

## 2023-10-21 DIAGNOSIS — H02401 Unspecified ptosis of right eyelid: Secondary | ICD-10-CM | POA: Diagnosis not present

## 2023-10-21 DIAGNOSIS — D692 Other nonthrombocytopenic purpura: Secondary | ICD-10-CM | POA: Diagnosis not present

## 2023-10-21 DIAGNOSIS — C44629 Squamous cell carcinoma of skin of left upper limb, including shoulder: Secondary | ICD-10-CM | POA: Diagnosis not present

## 2023-10-21 DIAGNOSIS — C44319 Basal cell carcinoma of skin of other parts of face: Secondary | ICD-10-CM | POA: Diagnosis not present

## 2023-10-21 DIAGNOSIS — H16223 Keratoconjunctivitis sicca, not specified as Sjogren's, bilateral: Secondary | ICD-10-CM | POA: Diagnosis not present

## 2023-10-29 ENCOUNTER — Encounter (INDEPENDENT_AMBULATORY_CARE_PROVIDER_SITE_OTHER): Admitting: Ophthalmology

## 2023-10-29 DIAGNOSIS — H43813 Vitreous degeneration, bilateral: Secondary | ICD-10-CM

## 2023-10-29 DIAGNOSIS — H33302 Unspecified retinal break, left eye: Secondary | ICD-10-CM

## 2023-10-29 DIAGNOSIS — H35372 Puckering of macula, left eye: Secondary | ICD-10-CM

## 2023-11-05 ENCOUNTER — Ambulatory Visit (INDEPENDENT_AMBULATORY_CARE_PROVIDER_SITE_OTHER): Payer: No Typology Code available for payment source

## 2023-11-05 DIAGNOSIS — I441 Atrioventricular block, second degree: Secondary | ICD-10-CM

## 2023-11-05 LAB — CUP PACEART REMOTE DEVICE CHECK
Battery Remaining Longevity: 42 mo
Battery Remaining Percentage: 36 %
Battery Voltage: 2.96 V
Brady Statistic AP VP Percent: 64 %
Brady Statistic AP VS Percent: 5.1 %
Brady Statistic AS VP Percent: 20 %
Brady Statistic AS VS Percent: 10 %
Brady Statistic RA Percent Paced: 67 %
Brady Statistic RV Percent Paced: 84 %
Date Time Interrogation Session: 20250625020013
Implantable Lead Connection Status: 753985
Implantable Lead Connection Status: 753985
Implantable Lead Implant Date: 20191226
Implantable Lead Implant Date: 20191226
Implantable Lead Location: 753859
Implantable Lead Location: 753860
Implantable Pulse Generator Implant Date: 20191226
Lead Channel Impedance Value: 510 Ohm
Lead Channel Impedance Value: 540 Ohm
Lead Channel Pacing Threshold Amplitude: 0.5 V
Lead Channel Pacing Threshold Amplitude: 0.5 V
Lead Channel Pacing Threshold Pulse Width: 0.5 ms
Lead Channel Pacing Threshold Pulse Width: 0.5 ms
Lead Channel Sensing Intrinsic Amplitude: 12 mV
Lead Channel Sensing Intrinsic Amplitude: 3.3 mV
Lead Channel Setting Pacing Amplitude: 0.75 V
Lead Channel Setting Pacing Amplitude: 2 V
Lead Channel Setting Pacing Pulse Width: 0.5 ms
Lead Channel Setting Sensing Sensitivity: 2 mV
Pulse Gen Model: 2272
Pulse Gen Serial Number: 9094310

## 2023-11-09 ENCOUNTER — Ambulatory Visit: Payer: Self-pay | Admitting: Cardiology

## 2023-11-12 DIAGNOSIS — H1849 Other corneal degeneration: Secondary | ICD-10-CM | POA: Diagnosis not present

## 2023-11-12 DIAGNOSIS — H1840 Unspecified corneal degeneration: Secondary | ICD-10-CM | POA: Diagnosis not present

## 2023-11-12 DIAGNOSIS — H26499 Other secondary cataract, unspecified eye: Secondary | ICD-10-CM | POA: Diagnosis not present

## 2023-11-12 DIAGNOSIS — H401131 Primary open-angle glaucoma, bilateral, mild stage: Secondary | ICD-10-CM | POA: Diagnosis not present

## 2023-11-19 DIAGNOSIS — C44319 Basal cell carcinoma of skin of other parts of face: Secondary | ICD-10-CM | POA: Diagnosis not present

## 2023-12-02 DIAGNOSIS — M5416 Radiculopathy, lumbar region: Secondary | ICD-10-CM | POA: Diagnosis not present

## 2023-12-03 DIAGNOSIS — Z961 Presence of intraocular lens: Secondary | ICD-10-CM | POA: Diagnosis not present

## 2023-12-03 DIAGNOSIS — H532 Diplopia: Secondary | ICD-10-CM | POA: Diagnosis not present

## 2023-12-03 DIAGNOSIS — Z9849 Cataract extraction status, unspecified eye: Secondary | ICD-10-CM | POA: Diagnosis not present

## 2023-12-03 DIAGNOSIS — H31012 Macula scars of posterior pole (postinflammatory) (post-traumatic), left eye: Secondary | ICD-10-CM | POA: Diagnosis not present

## 2023-12-16 DIAGNOSIS — M5416 Radiculopathy, lumbar region: Secondary | ICD-10-CM | POA: Diagnosis not present

## 2023-12-17 DIAGNOSIS — E039 Hypothyroidism, unspecified: Secondary | ICD-10-CM | POA: Diagnosis not present

## 2023-12-17 DIAGNOSIS — R7301 Impaired fasting glucose: Secondary | ICD-10-CM | POA: Diagnosis not present

## 2023-12-17 DIAGNOSIS — E785 Hyperlipidemia, unspecified: Secondary | ICD-10-CM | POA: Diagnosis not present

## 2023-12-17 DIAGNOSIS — Z79899 Other long term (current) drug therapy: Secondary | ICD-10-CM | POA: Diagnosis not present

## 2023-12-17 DIAGNOSIS — R82998 Other abnormal findings in urine: Secondary | ICD-10-CM | POA: Diagnosis not present

## 2023-12-17 DIAGNOSIS — N401 Enlarged prostate with lower urinary tract symptoms: Secondary | ICD-10-CM | POA: Diagnosis not present

## 2023-12-17 DIAGNOSIS — R946 Abnormal results of thyroid function studies: Secondary | ICD-10-CM | POA: Diagnosis not present

## 2023-12-22 ENCOUNTER — Ambulatory Visit: Admission: EM | Admit: 2023-12-22 | Discharge: 2023-12-22 | Disposition: A | Discharge status: Home / Self Care

## 2023-12-22 ENCOUNTER — Encounter: Payer: Self-pay | Admitting: Emergency Medicine

## 2023-12-22 DIAGNOSIS — S61412A Laceration without foreign body of left hand, initial encounter: Secondary | ICD-10-CM | POA: Diagnosis not present

## 2023-12-22 DIAGNOSIS — R55 Syncope and collapse: Secondary | ICD-10-CM

## 2023-12-22 MED ORDER — BACITRACIN ZINC 500 UNIT/GM EX OINT
TOPICAL_OINTMENT | Freq: Once | CUTANEOUS | Status: AC
  Administered 2023-12-22 (×2): 1 via TOPICAL

## 2023-12-22 NOTE — ED Provider Notes (Signed)
 [facility]  Note:  This document was prepared using Dragon voice recognition software and may include unintentional dictation errors.  MRN: [id number] DOB: Jul 13, 1932  Subjective:   Joseph Hanna is a 89 y.o. male presenting for hand laceration and pain following a fall that occurred yesterday morning when getting up to go to the bathroom.  Patient reports he woke up early in the morning to go to the bathroom when he stood up he felt dizzy and lost consciousness falling in the bathroom.  Patient states when he started falling he reached out to brace himself on the toilet which caused his left thumb to be bent back and causing a laceration in the web between his 1st and 2nd digit.  Patient reports that he is still having some pain to the left thumb.  Fall was not witnessed but his wife quickly came to his aid and does not believe that he hit his head.  Patient states that recently he has been getting dizzy when standing up but has not had any other syncopal events.  Patient is kept a bandage on his hand but states that it is still bleeding was unsure if he could get it fixed here but wanted to be evaluated.  Patient denies any headache, shortness of breath, chest pain, weakness, dizziness at this time.  No current facility-administered medications for this encounter.  Current Outpatient Medications:    Calcium  Carb-Cholecalciferol  600-10 MG-MCG TABS, Take by mouth., Disp: , Rfl:    Cholecalciferol  50 MCG (2000 UT) TABS, Take by mouth., Disp: , Rfl:    clotrimazole (LOTRIMIN) 1 % external solution, Apply topically., Disp: , Rfl:    Coenzyme Q10 (COQ10) 200 MG CAPS, Take 1 capsule by mouth daily., Disp: , Rfl:    cycloSPORINE (RESTASIS) 0.05 % ophthalmic emulsion, Apply to eye., Disp: , Rfl:    dorzolamide  (TRUSOPT ) 2 % ophthalmic solution, Apply to eye., Disp: , Rfl:    doxycycline  (VIBRAMYCIN ) 100 MG capsule, Take 1 capsule (100 mg total) by mouth 2 (two) times daily.  (Patient not taking: Reported on 12/22/2023), Disp: 20 capsule, Rfl: 0   finasteride  (PROSCAR ) 5 MG tablet, Take 5 mg by mouth daily., Disp: , Rfl:    fluoruracil (CARAC ) 0.5 % cream, Apply 1 Application topically daily., Disp: , Rfl:    Ginkgo Biloba 40 MG TABS, Take 120 mg by mouth daily. , Disp: , Rfl:    latanoprost  (XALATAN ) 0.005 % ophthalmic solution, Place 1 drop into the right eye at bedtime., Disp: , Rfl:    levothyroxine  (SYNTHROID , LEVOTHROID) 50 MCG tablet, Take 50-100 mcg by mouth daily before breakfast. Take 50mcg T, Th, S,and S.  Takes 100 mcg M,W,and F., Disp: , Rfl:    mesalamine  (APRISO ) 0.375 g 24 hr capsule, 4 caps orally once a day, Disp: , Rfl:    mesalamine  (CANASA ) 1000 MG suppository, Place 1,000 mg rectally at bedtime., Disp: , Rfl:    mesalamine  (LIALDA ) 1.2 g EC tablet, Take by mouth., Disp: , Rfl:    metroNIDAZOLE  (METROGEL ) 1 % gel, 1 application a thin layer to affected area Externally prn rosacea, Disp: , Rfl:    Multiple Vitamin (MULTIVITAMIN ADULT PO), SMARTSIG:1 By Mouth, Disp: , Rfl:    Netarsudil-Latanoprost  0.02-0.005 % SOLN, Apply 1 drop to eye at bedtime. Right eye., Disp: , Rfl:    niacin  (SLO-NIACIN ) 500 MG tablet, Take 500 mg by mouth at bedtime., Disp: , Rfl:    predniSONE  (DELTASONE ) 5 MG tablet, Take  5 mg by mouth every morning., Disp: , Rfl:    pregabalin  (LYRICA ) 75 MG capsule, Take 75 mg by mouth 2 (two) times daily., Disp: , Rfl:    simvastatin  (ZOCOR ) 20 MG tablet, Take 10 mg by mouth daily. TAKES QHS, Disp: , Rfl:    Allergies  Allergen Reactions   Itraconazole Rash   Other Rash   Atorvastatin Other (See Comments)    Other reaction(s): muscle and joint aches  Other Reaction(s): muscle aches   Beta Adrenergic Blockers Other (See Comments)    Patient doesn't know reaction   Irbesartan Other (See Comments)    Patient doesn't know reaction    Terbinafine Rash    Past Medical History:  Diagnosis Date   Arthritis    BPH (benign  prostatic hypertrophy)    Cancer (HCC)    kidney , skin cancer    CKD (chronic kidney disease)    Clavicle fracture    Right   ED (erectile dysfunction) of organic origin    Fatty liver 2020   US    Gallstones 2020   US    GERD (gastroesophageal reflux disease)    Glaucoma    bilateral --  right uses rx drops and left eye trabeculectomy 2011   Grade I diastolic dysfunction 04/21/2018   Noted on ECHO   History of bradycardia    History of gastroesophageal reflux (GERD)    History of left bundle branch block (LBBB) 05/05/2018   Noted on EKG   History of renal cell cancer    S/P  LEFT NEPHRECTOMY  2003--  no recurrence   History of ulcerative colitis    in remission   Hyperlipidemia    Hypothyroidism    Keratosis, actinic    Lower leg pain    pt states has fibula-tibula syndrome--  goes to physical therapy   LVH (left ventricular hypertrophy) 04/21/2018   Mode, noted on ECHO   Mobitz II 05/05/2018   Noted on EKG   Presence of permanent cardiac pacemaker    Rosacea    Thrombocytopenia (HCC)    TIA (transient ischemic attack) 06/20/2015   TIA (transient ischemic attack)    2017      Past Surgical History:  Procedure Laterality Date   bilateral knee meniscus surgery      CATARACT EXTRACTION W/ INTRAOCULAR LENS  IMPLANT, BILATERAL     COLONOSCOPY  FEB 2015   GREEN LIGHT LASER TURP (TRANSURETHRAL RESECTION OF PROSTATE N/A 08/10/2013   Procedure: GREEN LIGHT LASER TURP (TRANSURETHRAL RESECTION OF PROSTATE;  Surgeon: Donnice Gwenyth Brooks, MD;  Location: [facility];  Service: Urology;  Laterality: N/A;   LUMBAR LAMINECTOMY  1990   MOHS SURGERY     x 2   NEPHRECTOMY Left 2003   ORIF CLAVICULAR FRACTURE  12/03/2011   Procedure: OPEN REDUCTION INTERNAL FIXATION (ORIF) CLAVICULAR FRACTURE;  Surgeon: Eva Elsie Herring, MD;  Location: [facility];  Service: Orthopedics;  Laterality: Right;   PACEMAKER IMPLANT N/A 05/07/2018    [employer] MRI conditional  dual-chamber pacemaker by Dr Kelsie for mobitz II second degree AV block   right shoulder surgery      done at [facility]    SHOULDER ARTHROSCOPY Left LEFT  2012   TONSILLECTOMY  as child   TRABECULECTOMY  2011   left eye (glaucoma)   TRANSURETHRAL RESECTION OF PROSTATE N/A 07/24/2018   Procedure: TRANSURETHRAL RESECTION OF THE PROSTATE (TURP);  Surgeon: Brooks Donnice, MD;  Location: [facility];  Service: Urology;  Laterality: N/A;    Family History  Problem Relation Age of Onset   Breast cancer Mother    Emphysema Father    Alzheimer's disease Brother     Social History   Tobacco Use   Smoking status: Former    Current packs/day: 0.00    Types: Cigarettes    Start date: 12/02/1947    Quit date: 12/01/1953    Years since quitting: 70.1   Smokeless tobacco: Never  Vaping Use   Vaping status: Never Used  Substance Use Topics   Alcohol  use: Not Currently    Alcohol /week: 7.0 standard drinks of alcohol     Types: 7 Glasses of wine per week    Comment: glass of wine several days per week   Drug use: No    ROS Refer to HPI for ROS details.  Objective:    Vitals: BP (!) 150/68 (BP Location: Right Arm)   Pulse 71   Temp 97.9 F (36.6 C) (Oral)   Resp 17   SpO2 95%   Physical Exam Vitals and nursing note reviewed.  Constitutional:      General: He is not in acute distress.    Appearance: Normal appearance. He is well-developed. He is not ill-appearing or toxic-appearing.  HENT:     Head: Normocephalic and atraumatic.     Nose: Nose normal.     Mouth/Throat:     Mouth: Mucous membranes are moist.  Eyes:     General:        Right eye: No discharge.        Left eye: No discharge.     Extraocular Movements: Extraocular movements intact.     Conjunctiva/sclera: Conjunctivae normal.     Pupils: Pupils are equal, round, and reactive to light.  Cardiovascular:     Rate and Rhythm: Normal rate and regular rhythm.  Pulmonary:     Effort: Pulmonary  effort is normal. No respiratory distress.     Breath sounds: No stridor. No wheezing.  Chest:     Chest wall: No tenderness.  Musculoskeletal:     Left hand: Tenderness and bony tenderness present. No swelling or deformity. Normal range of motion. Normal strength. Normal sensation. Normal capillary refill. Normal pulse.  Skin:    General: Skin is warm and dry.     Capillary Refill: Capillary refill takes less than 2 seconds.     Findings: Bruising, laceration and wound present. No erythema.  Neurological:     General: No focal deficit present.     Mental Status: He is alert and oriented to person, place, and time.  Psychiatric:        Mood and Affect: Mood normal.        Behavior: Behavior normal.     Procedures  No results found for this or any previous visit (from the past 24 hours).  Assessment and Plan :     Discharge Instructions       1. Syncope, unspecified syncope type (Primary) - Due to syncopal episode in the middle the night with subsequent fall and past history of TIA and thrombocytopenia, recommend follow-up in emergency department for further evaluation and management. - Due to lack of stat laboratory testing and advanced imaging available in urgent care further evaluation is necessary to determine potential pertinent negatives related to syncopal episode and fall. - Please go to the emergency department at [facility] for further evaluation management.  2. Laceration of left hand, foreign  body presence unspecified, initial encounter - Wound care (Clean Wound) with Hibiclens  and warm water prior to dressing wound. - bacitracin  ointment in UC for infection prevention secondary to unsutured hand laceration - Apply dressing in UC for protection and hemostasis. - Due to fall with hand injury recommend x-ray of the left hand in ER to rule out fracture after fall with hand injury.      Mehtaab Mayeda B Zelda Reames   [place], [place], [place] 12/22/23  1622

## 2023-12-22 NOTE — Discharge Instructions (Addendum)
  1. Syncope, unspecified syncope type (Primary) - Due to syncopal episode in the middle the night with subsequent fall and past history of TIA and thrombocytopenia, recommend follow-up in emergency department for further evaluation and management. - Due to lack of stat laboratory testing and advanced imaging available in urgent care further evaluation is necessary to determine potential pertinent negatives related to syncopal episode and fall. - Please go to the emergency department at [facility] for further evaluation management.  2. Laceration of left hand, foreign body presence unspecified, initial encounter - Wound care (Clean Wound) with Hibiclens  and warm water prior to dressing wound. - bacitracin  ointment in UC for infection prevention secondary to unsutured hand laceration - Apply dressing in UC for protection and hemostasis. - Due to fall with hand injury recommend x-ray of the left hand in ER to rule out fracture after fall with hand injury.

## 2023-12-22 NOTE — ED Triage Notes (Signed)
 Pt present with left hand laceration to crease between thumb and pointer finger. Pt states he got dizzy when getting up to go to bathroom and blacked out. Pt states he tried to catch himself and thumb hit toilet and he bent finger backward.

## 2023-12-23 ENCOUNTER — Emergency Department (HOSPITAL_BASED_OUTPATIENT_CLINIC_OR_DEPARTMENT_OTHER): Admission: EM | Admit: 2023-12-23 | Discharge: 2023-12-23 | Disposition: A | Discharge status: Home / Self Care

## 2023-12-23 ENCOUNTER — Emergency Department (HOSPITAL_BASED_OUTPATIENT_CLINIC_OR_DEPARTMENT_OTHER)

## 2023-12-23 ENCOUNTER — Encounter (HOSPITAL_BASED_OUTPATIENT_CLINIC_OR_DEPARTMENT_OTHER): Payer: Self-pay | Admitting: Urology

## 2023-12-23 ENCOUNTER — Other Ambulatory Visit: Payer: Self-pay

## 2023-12-23 DIAGNOSIS — W1839XA Other fall on same level, initial encounter: Secondary | ICD-10-CM | POA: Insufficient documentation

## 2023-12-23 DIAGNOSIS — N401 Enlarged prostate with lower urinary tract symptoms: Secondary | ICD-10-CM | POA: Diagnosis not present

## 2023-12-23 DIAGNOSIS — Z23 Encounter for immunization: Secondary | ICD-10-CM | POA: Insufficient documentation

## 2023-12-23 DIAGNOSIS — R3915 Urgency of urination: Secondary | ICD-10-CM | POA: Diagnosis not present

## 2023-12-23 DIAGNOSIS — S61012A Laceration without foreign body of left thumb without damage to nail, initial encounter: Secondary | ICD-10-CM | POA: Insufficient documentation

## 2023-12-23 MED ORDER — TETANUS-DIPHTH-ACELL PERTUSSIS 5-2.5-18.5 LF-MCG/0.5 IM SUSY
0.5000 mL | PREFILLED_SYRINGE | Freq: Once | INTRAMUSCULAR | Status: AC
  Administered 2023-12-23 (×2): 0.5 mL via INTRAMUSCULAR
  Filled 2023-12-23: qty 0.5

## 2023-12-23 MED ORDER — CEPHALEXIN 500 MG PO CAPS: 500.0000 mg | ORAL_CAPSULE | Freq: Four times a day (QID) | ORAL | 0 refills | Status: AC

## 2023-12-23 NOTE — Discharge Instructions (Addendum)
 Please remain in the splint until you are able to follow-up with the Ortho doctor.  We have prescribed you antibiotic please take it to help prevent infection.  Return for fevers, chills, wound develops foul odor starts draining pus or any new or worsening symptoms that are concerning to you.

## 2023-12-23 NOTE — ED Provider Notes (Signed)
 [place] [facility] Provider Note   CSN: [id number] Arrival date & time: 12/23/23  1254     Patient presents with: Hand Injury   Joseph Hanna is a 89 y.o. male.   This is an 89 year old male presenting emergency department for evaluation of thumb pain and laceration.  Reports Sunday night got up to go to the bathroom got lightheaded and lost his balance fell with outstretched arm and caught himself on vanity, but did not come back.  Did not hit his head denies loss of consciousness.  Not taking a blood thinner.  Has had some persistent pain and some intermittent bleeding from the laceration to his thumb.  No numbness tingling changes in sensation.  Reports he has been in usual state of health.  Reports that he has had lightheadedness/orthostasis for some time that occurs when standing up too quickly.  He states he normally stands up and stands still for a minute or so before he starts to walk.  Last night he did not do so because he has to go to the bathroom.  He has had no further episodes of lightheadedness or passing out.  No chest pain no shortness of breath.   Hand Injury      Prior to Admission medications   Medication Sig Start Date End Date Taking? Authorizing Provider  cephALEXin  (KEFLEX ) 500 MG capsule Take 1 capsule (500 mg total) by mouth 4 (four) times daily for 3 days. 12/23/23 12/26/23 Yes Neysa Caron PARAS, DO  Calcium  Carb-Cholecalciferol  600-10 MG-MCG TABS Take by mouth. 08/08/14   [provider]  Cholecalciferol  50 MCG (2000 UT) TABS Take by mouth. 04/30/10   [provider]  clotrimazole (LOTRIMIN) 1 % external solution Apply topically. 07/18/22   [provider]  Coenzyme Q10 (COQ10) 200 MG CAPS Take 1 capsule by mouth daily.    [provider]  cycloSPORINE (RESTASIS) 0.05 % ophthalmic emulsion Apply to eye. 10/10/22   [provider]  dorzolamide  (TRUSOPT ) 2 % ophthalmic solution Apply to  eye. 10/10/22   [provider]  doxycycline  (VIBRAMYCIN ) 100 MG capsule Take 1 capsule (100 mg total) by mouth 2 (two) times daily. Patient not taking: Reported on 12/22/2023 08/22/23   Aurea Goodell B, NP  finasteride  (PROSCAR ) 5 MG tablet Take 5 mg by mouth daily. 01/26/20   [provider]  fluoruracil (CARAC ) 0.5 % cream Apply 1 Application topically daily.    [provider]  Ginkgo Biloba 40 MG TABS Take 120 mg by mouth daily.     [provider]  latanoprost  (XALATAN ) 0.005 % ophthalmic solution Place 1 drop into the right eye at bedtime.    [provider]  levothyroxine  (SYNTHROID , LEVOTHROID) 50 MCG tablet Take 50-100 mcg by mouth daily before breakfast. Take 50mcg T, Th, S,and S.  Takes 100 mcg M,W,and F.    [provider]  mesalamine  (APRISO ) 0.375 g 24 hr capsule 4 caps orally once a day 12/18/10   [provider]  mesalamine  (CANASA ) 1000 MG suppository Place 1,000 mg rectally at bedtime.    [provider]  mesalamine  (LIALDA ) 1.2 g EC tablet Take by mouth. 03/11/22   [provider]  metroNIDAZOLE  (METROGEL ) 1 % gel 1 application a thin layer to affected area Externally prn rosacea    [provider]  Multiple Vitamin (MULTIVITAMIN ADULT PO) SMARTSIG:1 By Mouth 07/31/21   [provider]  Netarsudil-Latanoprost  0.02-0.005 % SOLN Apply 1 drop  to eye at bedtime. Right eye.    [provider]  niacin  (SLO-NIACIN ) 500 MG tablet Take 500 mg by mouth at bedtime.    [provider]  predniSONE  (DELTASONE ) 5 MG tablet Take 5 mg by mouth every morning. 08/30/23   [provider]  pregabalin  (LYRICA ) 75 MG capsule Take 75 mg by mouth 2 (two) times daily. 09/22/23   [provider]  simvastatin  (ZOCOR ) 20 MG tablet Take 10 mg by mouth daily. TAKES QHS    [provider]    Allergies: Itraconazole, Other, Atorvastatin, Beta adrenergic blockers,  Irbesartan, and Terbinafine    Review of Systems  Updated Vital Signs BP 127/60   Pulse 67   Temp 98.3 F (36.8 C)   Resp 11   Ht 6' 2 (1.88 m)   Wt 77.6 kg   SpO2 93%   BMI 21.96 kg/m   Physical Exam Vitals and nursing note reviewed.  HENT:     Head: Normocephalic.     Mouth/Throat:     Mouth: Mucous membranes are moist.  Eyes:     Conjunctiva/sclera: Conjunctivae normal.  Cardiovascular:     Rate and Rhythm: Normal rate and regular rhythm.  Pulmonary:     Effort: Pulmonary effort is normal.  Abdominal:     General: Abdomen is flat. There is no distension.     Palpations: Abdomen is soft.  Musculoskeletal:     Comments: Left hand with laceration/skin tear between thumb and first finger at the inter web space.  Not having significant tenderness to the thumb.  Able to make an okay sign make a fist, cross fingers over spread fingers out.  Not actively bleeding currently.  Skin:    Capillary Refill: Capillary refill takes less than 2 seconds.  Neurological:     Mental Status: He is alert and oriented to person, place, and time.  Psychiatric:        Mood and Affect: Mood normal.        Behavior: Behavior normal.     (all labs ordered are listed, but only abnormal results are displayed) Labs Reviewed - No data to display  EKG: EKG Interpretation Date/Time:  Tuesday December 23 2023 14:31:04 EDT Ventricular Rate:  60 PR Interval:  192 QRS Duration:  143 QT Interval:  447 QTC Calculation: 447 R Axis:   124  Text Interpretation: Sinus rhythm Atrial premature complex Nonspecific intraventricular conduction delay Probable anterior infarct, old Minimal ST depression, inferior leads Confirmed by Neysa Clap [phone]) on 12/23/2023 2:42:04 PM  Radiology: [facility] Hand Complete Left Result Date: 12/23/2023 CLINICAL DATA:  Left hand pain status post fall EXAM: LEFT HAND - COMPLETE 3+ VIEW COMPARISON:  None Available. FINDINGS: There is no evidence of fracture or dislocation.  There is no evidence of arthropathy or other focal bone abnormality. Soft tissues are unremarkable. IMPRESSION: Negative. Electronically Signed   By: Megan  Zare M.D.   On: 12/23/2023 14:15     Procedures   Medications Ordered in the ED  Tdap (BOOSTRIX ) injection 0.5 mL (0.5 mLs Intramuscular Given 12/23/23 1445)                                    Medical Decision Making This is a 89 year old male presenting emergency department after a fall on Sunday.  Afebrile nontachycardic hemodynamically stable.  No LOC and did not hit his head.  Not on blood thinners per  chart review.   since he is asymptomatic from a neurostandpoint low suspicion for acute intracranial pathology and will forego CT head.  X-ray of hand negative for acute fracture.  She does appear that he has skin tear.  This occurred Sunday night given duration of time that is elapsed since initial injury will need to heal by secondary intention.  Will dress wound and place in thumb spica to help aid in healing as it appears to slightly open when he fully abducts his thumb.  Updated his tetanus.  Will place on short course of prophylactic antibiotics. ECG re-assuring. Has h/o of orthostasis and likely fall 2/2 same.  Patient to follow-up with the hand surgeon.  Stable for discharge at this time  Amount and/or Complexity of Data Reviewed ECG/medicine tests: ordered.  Risk Prescription drug management.      Final diagnoses:  Laceration of left thumb without foreign body, nail damage status unspecified, initial encounter    ED Discharge Orders          Ordered    cephALEXin (KEFLEX) 500 MG capsule  4 times daily        08 /12/25 1429               Neysa Caron PARAS, [place] 12/24/23 [phone]

## 2023-12-23 NOTE — ED Notes (Signed)
 TDAP INFORMATION PACKET GIVEN AND EXPLAINED TO PT. PT VERBALIZED UNDERSTANDING.

## 2023-12-23 NOTE — ED Triage Notes (Signed)
 Pt seen at [facility] on 8/11 and was told to come to ER for Xray  States hand injury from fall yesterday  Hand wrapped from UC, no bleeding noted  Pt states minimal pain only with movement   Has appointment with pcp tomorrow

## 2023-12-23 NOTE — ED Notes (Signed)
 DC instructions given, pt verbalized understanding. Out of ED with all belongings, brace to L arm, paperwork, with steady gait, not in visible distress.

## 2023-12-23 NOTE — ED Notes (Signed)
 XR at bedside

## 2023-12-24 DIAGNOSIS — Z950 Presence of cardiac pacemaker: Secondary | ICD-10-CM | POA: Diagnosis not present

## 2023-12-24 DIAGNOSIS — D849 Immunodeficiency, unspecified: Secondary | ICD-10-CM | POA: Diagnosis not present

## 2023-12-24 DIAGNOSIS — H409 Unspecified glaucoma: Secondary | ICD-10-CM | POA: Diagnosis not present

## 2023-12-24 DIAGNOSIS — E785 Hyperlipidemia, unspecified: Secondary | ICD-10-CM | POA: Diagnosis not present

## 2023-12-24 DIAGNOSIS — K5190 Ulcerative colitis, unspecified, without complications: Secondary | ICD-10-CM | POA: Diagnosis not present

## 2023-12-24 DIAGNOSIS — I739 Peripheral vascular disease, unspecified: Secondary | ICD-10-CM | POA: Diagnosis not present

## 2023-12-24 DIAGNOSIS — C649 Malignant neoplasm of unspecified kidney, except renal pelvis: Secondary | ICD-10-CM | POA: Diagnosis not present

## 2023-12-24 DIAGNOSIS — Q600 Renal agenesis, unilateral: Secondary | ICD-10-CM | POA: Diagnosis not present

## 2023-12-24 DIAGNOSIS — D692 Other nonthrombocytopenic purpura: Secondary | ICD-10-CM | POA: Diagnosis not present

## 2023-12-24 DIAGNOSIS — M47816 Spondylosis without myelopathy or radiculopathy, lumbar region: Secondary | ICD-10-CM | POA: Diagnosis not present

## 2023-12-24 DIAGNOSIS — E039 Hypothyroidism, unspecified: Secondary | ICD-10-CM | POA: Diagnosis not present

## 2023-12-24 DIAGNOSIS — Z0000 Encounter for general adult medical examination without abnormal findings: Secondary | ICD-10-CM | POA: Diagnosis not present

## 2023-12-31 ENCOUNTER — Telehealth: Payer: Self-pay | Admitting: Cardiology

## 2023-12-31 NOTE — Telephone Encounter (Signed)
 Called pt's daughter and let her know that her name is not in the chart. Called pt and explained that Dr. Inocencio is not in the office today but that I would forward his request to Dr. Carolyn nurse for assistance with a note approving the pt for an MRI. Pt verbalized understanding. All questions if any were answered.

## 2023-12-31 NOTE — Telephone Encounter (Signed)
 Spoke with daughter who gave me imaging center's contact : Michaelle [phone] information.   Spoke with Michaelle she is sending me the fax form now to complete. Patient does have a complete MRI compatible device.  Completing clearance form and will send back this pm.

## 2023-12-31 NOTE — Telephone Encounter (Signed)
 Caller Philemon) stated patient needs a Cardiology Approval Form for MRI faxed to [facility], Attn:  Billie at fax# [phone].  Caller noted form needs to be sent today as office is holding a spot for the patient.

## 2024-01-01 ENCOUNTER — Ambulatory Visit: Admitting: Orthopedic Surgery

## 2024-01-01 DIAGNOSIS — S61412A Laceration without foreign body of left hand, initial encounter: Secondary | ICD-10-CM | POA: Diagnosis not present

## 2024-01-01 DIAGNOSIS — S61411A Laceration without foreign body of right hand, initial encounter: Secondary | ICD-10-CM

## 2024-01-01 NOTE — Progress Notes (Signed)
 Joseph Hanna - 89 y.o. male MRN [id number]  Date of birth: [birth year]  Office Visit Note: Visit Date: 01/01/2024 PCP: Shayne Anes, MD Referred by: Shayne Anes, MD  Subjective: No chief complaint on file.  HPI: Joseph Hanna is a pleasant 89 y.o. male who presents today for evaluation of a left hand laceration injury sustained approximately 9 days ago.  Injury mechanism describes a mechanical fall onto the left hand, thumb was violently abducted with notable skin tearing in the webspace region.  Was seen in the emergency department setting, wound was cleaned and dressed at that time and allowed to heal by secondary intention.  Has been doing dressing changes ongoing.  Has full range of motion of the thumb, denies any numbness or tingling.  Pertinent ROS were reviewed with the patient and found to be negative unless otherwise specified above in HPI.   Visit Reason: Laceration on left hand Duration of symptoms: 9 days  Hand dominance: right Occupation: retired Diabetic: No Smoking: No Heart/Lung History: TIA (transient ischemic attack) Essential hypertension Heart block AV complete (HCC) Paroxysmal atrial fibrillation (HCC)   Blood Thinners:  none  Prior Testing/EMG: x-rays Injections (Date): none Treatments: bandage Prior Surgery: none  Assessment & Plan: Visit Diagnoses:  1. Laceration of right hand without foreign body, initial encounter     Plan: Based on his workup today, there is no significant injury to the left thumb from a tendon, nerve or vessel standpoint.  He has full range of motion at the IP and MP of the joint with both flexion and extension, digital sensation is intact distally as well.  This appears to be a superficial skin tear at the thumb webspace region that we will need some time for granulation and secondary intention healing.  I instructed him on appropriate wound care maintenance and daily dressing changes which should be performed over the next  coming weeks to allow for further healing.  No evidence of ongoing infection.  He can follow-up with myself as needed.  Return precautions were discussed in detail.  I spent 45 minutes in the care of this patient today including review of previous documentation, imaging obtained, face-to-face time discussing all options regarding treatment and documenting the encounter.   Follow-up: No follow-ups on file.   Meds & Orders: No orders of the defined types were placed in this encounter.  No orders of the defined types were placed in this encounter.    Procedures: No procedures performed      Clinical History: No specialty comments available.  He reports that he quit smoking about 70 years ago. His smoking use included cigarettes. He started smoking about 76 years ago. He has never used smokeless tobacco. No results for input(s): HGBA1C, LABURIC in the last 8760 hours.  Objective:   Vital Signs: There were no vitals taken for this visit.  Physical Exam  Gen: Well-appearing, in no acute distress; non-toxic CV: Regular Rate. Well-perfused. Warm.  Resp: Breathing unlabored on room air; no wheezing. Psych: Fluid speech in conversation; appropriate affect; normal thought process  Ortho Exam Right hand: - Notable skin tearing at the thumbs webspace region, there is evidence of granulation tissue beneath, slight maceration to the skin edges, no erythema, no active or expressible drainage - Thumb IP flexion is full 0-80 - Thumb MCP range of motion is stable - Thumb sensation both radially and ulnarly fully intact  Imaging: No results found.  Past Medical/Family/Surgical/Social History: Medications & Allergies reviewed per EMR,  new medications updated. Patient Active Problem List   Diagnosis Date Noted   Chronic pain syndrome 01/30/2023   Lumbar stenosis with neurogenic claudication 07/23/2022   Heart block AV complete (HCC) 12/04/2021   Paroxysmal atrial fibrillation (HCC)  12/04/2021   Pacemaker 12/04/2021   Essential hypertension 07/25/2015   Difficulty speaking 06/19/2015   TIA (transient ischemic attack) 06/19/2015   Thrombocytopenia (HCC) 06/19/2015   CKD (chronic kidney disease) 06/19/2015   Benign prostatic hyperplasia    History of ulcerative colitis    Hypothyroidism    Hyperlipidemia    History of renal cell cancer    History of gastroesophageal reflux (GERD)    Past Medical History:  Diagnosis Date   Arthritis    BPH (benign prostatic hypertrophy)    Cancer (HCC)    kidney , skin cancer    CKD (chronic kidney disease)    Clavicle fracture    Right   ED (erectile dysfunction) of organic origin    Fatty liver 2020   US    Gallstones 2020   US    GERD (gastroesophageal reflux disease)    Glaucoma    bilateral --  right uses rx drops and left eye trabeculectomy 2011   Grade I diastolic dysfunction 04/21/2018   Noted on ECHO   History of bradycardia    History of gastroesophageal reflux (GERD)    History of left bundle branch block (LBBB) 05/05/2018   Noted on EKG   History of renal cell cancer    S/P  LEFT NEPHRECTOMY  2003--  no recurrence   History of ulcerative colitis    in remission   Hyperlipidemia    Hypothyroidism    Keratosis, actinic    Lower leg pain    pt states has fibula-tibula syndrome--  goes to physical therapy   LVH (left ventricular hypertrophy) 04/21/2018   Mode, noted on ECHO   Mobitz II 05/05/2018   Noted on EKG   Presence of permanent cardiac pacemaker    Rosacea    Thrombocytopenia (HCC)    TIA (transient ischemic attack) 06/20/2015   TIA (transient ischemic attack)    2017    Family History  Problem Relation Age of Onset   Breast cancer Mother    Emphysema Father    Alzheimer's disease Brother    Past Surgical History:  Procedure Laterality Date   bilateral knee meniscus surgery      CATARACT EXTRACTION W/ INTRAOCULAR LENS  IMPLANT, BILATERAL     COLONOSCOPY  FEB 2015   GREEN LIGHT LASER  TURP (TRANSURETHRAL RESECTION OF PROSTATE N/A 08/10/2013   Procedure: GREEN LIGHT LASER TURP (TRANSURETHRAL RESECTION OF PROSTATE;  Surgeon: Donnice Gwenyth Brooks, MD;  Location: [facility];  Service: Urology;  Laterality: N/A;   LUMBAR LAMINECTOMY  1990   MOHS SURGERY     x 2   NEPHRECTOMY Left 2003   ORIF CLAVICULAR FRACTURE  12/03/2011   Procedure: OPEN REDUCTION INTERNAL FIXATION (ORIF) CLAVICULAR FRACTURE;  Surgeon: Eva Elsie Herring, MD;  Location: [facility];  Service: Orthopedics;  Laterality: Right;   PACEMAKER IMPLANT N/A 05/07/2018    St Jude Medical Assurity MRI conditional  dual-chamber pacemaker by Dr Kelsie for mobitz II second degree AV block   right shoulder surgery      done at [facility]    SHOULDER ARTHROSCOPY Left LEFT  2012   TONSILLECTOMY  as child   TRABECULECTOMY  2011   left eye (glaucoma)   TRANSURETHRAL RESECTION OF PROSTATE  N/A 07/24/2018   Procedure: TRANSURETHRAL RESECTION OF THE PROSTATE (TURP);  Surgeon: Nieves Cough, MD;  Location: [facility];  Service: Urology;  Laterality: N/A;   Social History   Occupational History   Occupation: Retired-Salesman  Tobacco Use   Smoking status: Former    Current packs/day: 0.00    Types: Cigarettes    Start date: 12/02/1947    Quit date: 12/01/1953    Years since quitting: 70.1   Smokeless tobacco: Never  Vaping Use   Vaping status: Never Used  Substance and Sexual Activity   Alcohol  use: Not Currently    Alcohol /week: 7.0 standard drinks of alcohol     Types: 7 Glasses of wine per week    Comment: glass of wine several days per week   Drug use: No   Sexual activity: Not Currently    Elka Satterfield Estela) Arlinda, M.D. [facility], Hand Surgery

## 2024-01-07 DIAGNOSIS — H53143 Visual discomfort, bilateral: Secondary | ICD-10-CM | POA: Diagnosis not present

## 2024-01-07 DIAGNOSIS — H532 Diplopia: Secondary | ICD-10-CM | POA: Diagnosis not present

## 2024-01-07 DIAGNOSIS — H31012 Macula scars of posterior pole (postinflammatory) (post-traumatic), left eye: Secondary | ICD-10-CM | POA: Diagnosis not present

## 2024-01-15 ENCOUNTER — Other Ambulatory Visit: Payer: Self-pay | Admitting: *Deleted

## 2024-01-15 MED ORDER — PREDNISONE 5 MG PO TABS: 5.0000 mg | ORAL_TABLET | Freq: Every morning | ORAL | 0 refills | Status: DC

## 2024-01-20 DIAGNOSIS — C44629 Squamous cell carcinoma of skin of left upper limb, including shoulder: Secondary | ICD-10-CM | POA: Diagnosis not present

## 2024-01-27 ENCOUNTER — Encounter (INDEPENDENT_AMBULATORY_CARE_PROVIDER_SITE_OTHER): Admitting: Ophthalmology

## 2024-01-27 DIAGNOSIS — H33302 Unspecified retinal break, left eye: Secondary | ICD-10-CM

## 2024-01-27 DIAGNOSIS — H43813 Vitreous degeneration, bilateral: Secondary | ICD-10-CM

## 2024-01-27 DIAGNOSIS — H35372 Puckering of macula, left eye: Secondary | ICD-10-CM | POA: Diagnosis not present

## 2024-01-28 NOTE — Progress Notes (Signed)
 Remote PPM Transmission

## 2024-02-04 ENCOUNTER — Ambulatory Visit (INDEPENDENT_AMBULATORY_CARE_PROVIDER_SITE_OTHER): Payer: No Typology Code available for payment source

## 2024-02-04 DIAGNOSIS — M5115 Intervertebral disc disorders with radiculopathy, thoracolumbar region: Secondary | ICD-10-CM | POA: Diagnosis not present

## 2024-02-04 DIAGNOSIS — I441 Atrioventricular block, second degree: Secondary | ICD-10-CM | POA: Diagnosis not present

## 2024-02-04 DIAGNOSIS — M545 Low back pain, unspecified: Secondary | ICD-10-CM | POA: Diagnosis not present

## 2024-02-04 DIAGNOSIS — M5117 Intervertebral disc disorders with radiculopathy, lumbosacral region: Secondary | ICD-10-CM | POA: Diagnosis not present

## 2024-02-04 DIAGNOSIS — Z950 Presence of cardiac pacemaker: Secondary | ICD-10-CM | POA: Diagnosis not present

## 2024-02-04 DIAGNOSIS — M5416 Radiculopathy, lumbar region: Secondary | ICD-10-CM | POA: Diagnosis not present

## 2024-02-04 DIAGNOSIS — M48061 Spinal stenosis, lumbar region without neurogenic claudication: Secondary | ICD-10-CM | POA: Diagnosis not present

## 2024-02-04 DIAGNOSIS — M4727 Other spondylosis with radiculopathy, lumbosacral region: Secondary | ICD-10-CM | POA: Diagnosis not present

## 2024-02-05 DIAGNOSIS — M5416 Radiculopathy, lumbar region: Secondary | ICD-10-CM | POA: Diagnosis not present

## 2024-02-09 ENCOUNTER — Ambulatory Visit: Payer: Self-pay | Admitting: Cardiology

## 2024-02-09 LAB — CUP PACEART REMOTE DEVICE CHECK
Battery Remaining Longevity: 40 mo
Battery Remaining Percentage: 33 %
Battery Voltage: 2.96 V
Brady Statistic AP VP Percent: 65 %
Brady Statistic AP VS Percent: 3.2 %
Brady Statistic AS VP Percent: 23 %
Brady Statistic AS VS Percent: 8.7 %
Brady Statistic RA Percent Paced: 64 %
Brady Statistic RV Percent Paced: 88 %
Date Time Interrogation Session: 20250927001231
Implantable Lead Connection Status: 753985
Implantable Lead Connection Status: 753985
Implantable Lead Implant Date: 20191226
Implantable Lead Implant Date: 20191226
Implantable Lead Location: 753859
Implantable Lead Location: 753860
Implantable Pulse Generator Implant Date: 20191226
Lead Channel Impedance Value: 440 Ohm
Lead Channel Impedance Value: 540 Ohm
Lead Channel Pacing Threshold Amplitude: 0.375 V
Lead Channel Pacing Threshold Amplitude: 0.625 V
Lead Channel Pacing Threshold Pulse Width: 0.5 ms
Lead Channel Pacing Threshold Pulse Width: 0.5 ms
Lead Channel Sensing Intrinsic Amplitude: 12 mV
Lead Channel Sensing Intrinsic Amplitude: 3.1 mV
Lead Channel Setting Pacing Amplitude: 0.875
Lead Channel Setting Pacing Amplitude: 1.375
Lead Channel Setting Pacing Pulse Width: 0.5 ms
Lead Channel Setting Sensing Sensitivity: 0.5 mV
Pulse Gen Model: 2272
Pulse Gen Serial Number: 9094310

## 2024-02-09 NOTE — Progress Notes (Signed)
 Remote PPM Transmission

## 2024-02-12 DIAGNOSIS — M5416 Radiculopathy, lumbar region: Secondary | ICD-10-CM | POA: Diagnosis not present

## 2024-02-12 DIAGNOSIS — M545 Low back pain, unspecified: Secondary | ICD-10-CM | POA: Diagnosis not present

## 2024-02-13 DIAGNOSIS — H35372 Puckering of macula, left eye: Secondary | ICD-10-CM | POA: Diagnosis not present

## 2024-02-13 DIAGNOSIS — H43813 Vitreous degeneration, bilateral: Secondary | ICD-10-CM | POA: Diagnosis not present

## 2024-02-13 DIAGNOSIS — H31092 Other chorioretinal scars, left eye: Secondary | ICD-10-CM | POA: Diagnosis not present

## 2024-02-13 DIAGNOSIS — H04123 Dry eye syndrome of bilateral lacrimal glands: Secondary | ICD-10-CM | POA: Diagnosis not present

## 2024-02-14 ENCOUNTER — Other Ambulatory Visit: Payer: Self-pay | Admitting: Hematology & Oncology

## 2024-02-17 ENCOUNTER — Encounter: Attending: Registered Nurse | Admitting: Registered Nurse

## 2024-02-17 ENCOUNTER — Encounter: Payer: Self-pay | Admitting: Registered Nurse

## 2024-02-17 VITALS — BP 137/76 | HR 74 | Ht 74.0 in | Wt 170.0 lb

## 2024-02-17 DIAGNOSIS — M48062 Spinal stenosis, lumbar region with neurogenic claudication: Secondary | ICD-10-CM | POA: Diagnosis not present

## 2024-02-17 DIAGNOSIS — G894 Chronic pain syndrome: Secondary | ICD-10-CM | POA: Insufficient documentation

## 2024-02-17 DIAGNOSIS — M792 Neuralgia and neuritis, unspecified: Secondary | ICD-10-CM | POA: Diagnosis not present

## 2024-02-17 NOTE — Progress Notes (Unsigned)
 Subjective:    Patient ID: Joseph Hanna, male    DOB: Jun 18, 1932, 89 y.o.   MRN: [id number]  HPI: Joseph Hanna is a 89 y.o. male who returns for follow up appointment for chronic pain and medication refill. He states his pain is located in his lower back and bilateral lower extremities with tingling and burning. He . rates  his pain 0. His current exercise regime is walking.  He reports he is receiving spinal injections from Ortho  in [place].     Pain Inventory Average Pain 6 Pain Right Now 0 My pain is constant, dull, stabbing, and aching  In the last 24 hours, has pain interfered with the following? General activity 10 Relation with others 10 Enjoyment of life 10 What TIME of day is your pain at its worst? varies Sleep (in general) Good  Pain is worse with: walking, bending, standing, and some activites Pain improves with: rest, pacing activities, medication, and injections Relief from Meds: No answer  Family History  Problem Relation Age of Onset   Breast cancer Mother    Emphysema Father    Alzheimer's disease Brother    Social History   Socioeconomic History   Marital status: Married    Spouse name: Not on file   Number of children: 4   Years of education: Not on file   Highest education level: Not on file  Occupational History   Occupation: Retired-Salesman  Tobacco Use   Smoking status: Former    Current packs/day: 0.00    Types: Cigarettes    Start date: 12/02/1947    Quit date: 12/01/1953    Years since quitting: 70.2   Smokeless tobacco: Never  Vaping Use   Vaping status: Never Used  Substance and Sexual Activity   Alcohol  use: Not Currently    Alcohol /week: 7.0 standard drinks of alcohol     Types: 7 Glasses of wine per week    Comment: glass of wine several days per week   Drug use: No   Sexual activity: Not Currently  Other Topics Concern   Not on file  Social History Narrative   Not on file   Social Drivers of Health   Financial  Resource Strain: Not on file  Food Insecurity: Not on file  Transportation Needs: Not on file  Physical Activity: Not on file  Stress: Not on file  Social Connections: Unknown (09/25/2021)   Received from [facility]   Social Network    Social Network: Not on file   Past Surgical History:  Procedure Laterality Date   bilateral knee meniscus surgery      CATARACT EXTRACTION W/ INTRAOCULAR LENS  IMPLANT, BILATERAL     COLONOSCOPY  FEB 2015   GREEN LIGHT LASER TURP (TRANSURETHRAL RESECTION OF PROSTATE N/A 08/10/2013   Procedure: GREEN LIGHT LASER TURP (TRANSURETHRAL RESECTION OF PROSTATE;  Surgeon: Donnice Gwenyth Brooks, MD;  Location: [facility];  Service: Urology;  Laterality: N/A;   LUMBAR LAMINECTOMY  1990   MOHS SURGERY     x 2   NEPHRECTOMY Left 2003   ORIF CLAVICULAR FRACTURE  12/03/2011   Procedure: OPEN REDUCTION INTERNAL FIXATION (ORIF) CLAVICULAR FRACTURE;  Surgeon: Eva Elsie Herring, MD;  Location: [facility];  Service: Orthopedics;  Laterality: Right;   PACEMAKER IMPLANT N/A 05/07/2018    St Jude Medical Assurity MRI conditional  dual-chamber pacemaker by Dr Kelsie for mobitz II second degree AV block   right shoulder surgery  done at [facility]    SHOULDER ARTHROSCOPY Left LEFT  2012   TONSILLECTOMY  as child   TRABECULECTOMY  2011   left eye (glaucoma)   TRANSURETHRAL RESECTION OF PROSTATE N/A 07/24/2018   Procedure: TRANSURETHRAL RESECTION OF THE PROSTATE (TURP);  Surgeon: Nieves Cough, MD;  Location: [facility];  Service: Urology;  Laterality: N/A;   Past Surgical History:  Procedure Laterality Date   bilateral knee meniscus surgery      CATARACT EXTRACTION W/ INTRAOCULAR LENS  IMPLANT, BILATERAL     COLONOSCOPY  FEB 2015   GREEN LIGHT LASER TURP (TRANSURETHRAL RESECTION OF PROSTATE N/A 08/10/2013   Procedure: GREEN LIGHT LASER TURP (TRANSURETHRAL RESECTION OF PROSTATE;  Surgeon: Cough Gwenyth Nieves, MD;  Location: [facility];  Service: Urology;  Laterality: N/A;   LUMBAR LAMINECTOMY  1990   MOHS SURGERY     x 2   NEPHRECTOMY Left 2003   ORIF CLAVICULAR FRACTURE  12/03/2011   Procedure: OPEN REDUCTION INTERNAL FIXATION (ORIF) CLAVICULAR FRACTURE;  Surgeon: Eva Elsie Herring, MD;  Location: [facility];  Service: Orthopedics;  Laterality: Right;   PACEMAKER IMPLANT N/A 05/07/2018    St Jude Medical Assurity MRI conditional  dual-chamber pacemaker by Dr Kelsie for mobitz II second degree AV block   right shoulder surgery      done at [facility]    SHOULDER ARTHROSCOPY Left LEFT  2012   TONSILLECTOMY  as child   TRABECULECTOMY  2011   left eye (glaucoma)   TRANSURETHRAL RESECTION OF PROSTATE N/A 07/24/2018   Procedure: TRANSURETHRAL RESECTION OF THE PROSTATE (TURP);  Surgeon: Nieves Cough, MD;  Location: [facility];  Service: Urology;  Laterality: N/A;   Past Medical History:  Diagnosis Date   Arthritis    BPH (benign prostatic hypertrophy)    Cancer (HCC)    kidney , skin cancer    CKD (chronic kidney disease)    Clavicle fracture    Right   ED (erectile dysfunction) of organic origin    Fatty liver 2020   US    Gallstones 2020   US    GERD (gastroesophageal reflux disease)    Glaucoma    bilateral --  right uses rx drops and left eye trabeculectomy 2011   Grade I diastolic dysfunction 04/21/2018   Noted on ECHO   History of bradycardia    History of gastroesophageal reflux (GERD)    History of left bundle branch block (LBBB) 05/05/2018   Noted on EKG   History of renal cell cancer    S/P  LEFT NEPHRECTOMY  2003--  no recurrence   History of ulcerative colitis    in remission   Hyperlipidemia    Hypothyroidism    Keratosis, actinic    Lower leg pain    pt states has fibula-tibula syndrome--  goes to physical therapy   LVH (left ventricular hypertrophy) 04/21/2018   Mode, noted on ECHO   Mobitz II 05/05/2018   Noted on EKG   Presence of permanent cardiac pacemaker     Rosacea    Thrombocytopenia    TIA (transient ischemic attack) 06/20/2015   TIA (transient ischemic attack)    2017    BP 137/76 (BP Location: Left Arm, Patient Position: Sitting, Cuff Size: Normal)   Pulse 74   Ht 6' 2 (1.88 m)   Wt 170 lb (77.1 kg)   SpO2 96%   BMI 21.83 kg/m   Opioid Risk Score:   Fall Risk Score:  `1  Depression screen [facility] 2/9     08/22/2023   10:44 AM 03/18/2023   10:17 AM 01/30/2023    9:24 AM 11/29/2022    9:37 AM 07/23/2022   10:56 AM 06/11/2022   11:41 AM 03/12/2022   11:42 AM  Depression screen PHQ 2/9  Decreased Interest 0 0 0 0 3 0 0  Down, Depressed, Hopeless 0 0 0 0 1 0 0  PHQ - 2 Score 0 0 0 0 4 0 0     Review of Systems  Musculoskeletal:  Positive for arthralgias, back pain and myalgias.       Low back pain radiating down legs, bilateral knee pain  All other systems reviewed and are negative.      Objective:   Physical Exam Vitals and nursing note reviewed.  Constitutional:      Appearance: Normal appearance.  Cardiovascular:     Rate and Rhythm: Normal rate and regular rhythm.     Pulses: Normal pulses.     Heart sounds: Normal heart sounds.  Pulmonary:     Effort: Pulmonary effort is normal.     Breath sounds: Normal breath sounds.  Musculoskeletal:     Comments: Normal Muscle Bulk and Muscle Testing Reveals:  Upper Extremities:Full  ROM and Muscle Strength 5/5  Lower Extremities: Full ROM and Muscle Strength 5/5 Arises from Table slowly Narrow Based  Gait     Skin:    General: Skin is warm and dry.  Neurological:     Mental Status: He is alert and oriented to person, place, and time.  Psychiatric:        Mood and Affect: Mood normal.        Behavior: Behavior normal.          Assessment & Plan:  Lumbar Stenosis with neurogenic claudication: Continue HEP as Tolerated. Continue current medication regimen. 02/17/2024 2. Chronic Pain Syndrome: Continue  Pregabalin  75 mg twice a day #60. Ortho prescribing. .  Continue to Monitor. 02/17/2024  Mr. Gimpel will call to scheduled appointment, he is currently under the care of Ortho in [place]. He verbalizes understanding.

## 2024-03-08 DIAGNOSIS — H31012 Macula scars of posterior pole (postinflammatory) (post-traumatic), left eye: Secondary | ICD-10-CM | POA: Diagnosis not present

## 2024-03-08 DIAGNOSIS — H53143 Visual discomfort, bilateral: Secondary | ICD-10-CM | POA: Diagnosis not present

## 2024-03-08 DIAGNOSIS — H16141 Punctate keratitis, right eye: Secondary | ICD-10-CM | POA: Diagnosis not present

## 2024-03-09 DIAGNOSIS — G609 Hereditary and idiopathic neuropathy, unspecified: Secondary | ICD-10-CM | POA: Diagnosis not present

## 2024-03-09 DIAGNOSIS — G5732 Lesion of lateral popliteal nerve, left lower limb: Secondary | ICD-10-CM | POA: Diagnosis not present

## 2024-03-11 DIAGNOSIS — G5732 Lesion of lateral popliteal nerve, left lower limb: Secondary | ICD-10-CM | POA: Diagnosis not present

## 2024-03-12 DIAGNOSIS — Z01818 Encounter for other preprocedural examination: Secondary | ICD-10-CM | POA: Diagnosis not present

## 2024-03-12 DIAGNOSIS — G5732 Lesion of lateral popliteal nerve, left lower limb: Secondary | ICD-10-CM | POA: Diagnosis not present

## 2024-03-15 ENCOUNTER — Encounter: Payer: Self-pay | Admitting: Radiology

## 2024-03-16 ENCOUNTER — Other Ambulatory Visit: Payer: Self-pay | Admitting: Hematology & Oncology

## 2024-03-16 DIAGNOSIS — H31012 Macula scars of posterior pole (postinflammatory) (post-traumatic), left eye: Secondary | ICD-10-CM | POA: Diagnosis not present

## 2024-03-16 DIAGNOSIS — H53143 Visual discomfort, bilateral: Secondary | ICD-10-CM | POA: Diagnosis not present

## 2024-03-16 DIAGNOSIS — H16141 Punctate keratitis, right eye: Secondary | ICD-10-CM | POA: Diagnosis not present

## 2024-03-18 DIAGNOSIS — G8929 Other chronic pain: Secondary | ICD-10-CM | POA: Diagnosis not present

## 2024-03-18 DIAGNOSIS — M25562 Pain in left knee: Secondary | ICD-10-CM | POA: Diagnosis not present

## 2024-03-18 DIAGNOSIS — I10 Essential (primary) hypertension: Secondary | ICD-10-CM | POA: Diagnosis not present

## 2024-03-18 DIAGNOSIS — S8412XA Injury of peroneal nerve at lower leg level, left leg, initial encounter: Secondary | ICD-10-CM | POA: Diagnosis not present

## 2024-03-19 DIAGNOSIS — D72829 Elevated white blood cell count, unspecified: Secondary | ICD-10-CM | POA: Diagnosis not present

## 2024-03-19 DIAGNOSIS — D696 Thrombocytopenia, unspecified: Secondary | ICD-10-CM | POA: Diagnosis not present

## 2024-03-19 DIAGNOSIS — D72821 Monocytosis (symptomatic): Secondary | ICD-10-CM | POA: Diagnosis not present

## 2024-03-19 DIAGNOSIS — D539 Nutritional anemia, unspecified: Secondary | ICD-10-CM | POA: Diagnosis not present

## 2024-03-24 DIAGNOSIS — H16141 Punctate keratitis, right eye: Secondary | ICD-10-CM | POA: Diagnosis not present

## 2024-03-24 DIAGNOSIS — H53143 Visual discomfort, bilateral: Secondary | ICD-10-CM | POA: Diagnosis not present

## 2024-03-24 DIAGNOSIS — H52223 Regular astigmatism, bilateral: Secondary | ICD-10-CM | POA: Diagnosis not present

## 2024-03-24 DIAGNOSIS — H524 Presbyopia: Secondary | ICD-10-CM | POA: Diagnosis not present

## 2024-03-24 DIAGNOSIS — H31012 Macula scars of posterior pole (postinflammatory) (post-traumatic), left eye: Secondary | ICD-10-CM | POA: Diagnosis not present

## 2024-03-24 DIAGNOSIS — H5203 Hypermetropia, bilateral: Secondary | ICD-10-CM | POA: Diagnosis not present

## 2024-03-31 DIAGNOSIS — Z5189 Encounter for other specified aftercare: Secondary | ICD-10-CM | POA: Diagnosis not present

## 2024-03-31 DIAGNOSIS — M1712 Unilateral primary osteoarthritis, left knee: Secondary | ICD-10-CM | POA: Diagnosis not present

## 2024-03-31 DIAGNOSIS — Z9889 Other specified postprocedural states: Secondary | ICD-10-CM | POA: Diagnosis not present

## 2024-04-01 DIAGNOSIS — M1712 Unilateral primary osteoarthritis, left knee: Secondary | ICD-10-CM | POA: Diagnosis not present

## 2024-04-06 DIAGNOSIS — H16141 Punctate keratitis, right eye: Secondary | ICD-10-CM | POA: Diagnosis not present

## 2024-04-07 ENCOUNTER — Inpatient Hospital Stay

## 2024-04-07 ENCOUNTER — Inpatient Hospital Stay: Admitting: Medical Oncology

## 2024-04-07 DIAGNOSIS — M1712 Unilateral primary osteoarthritis, left knee: Secondary | ICD-10-CM | POA: Diagnosis not present

## 2024-04-07 DIAGNOSIS — M25562 Pain in left knee: Secondary | ICD-10-CM | POA: Diagnosis not present

## 2024-04-11 ENCOUNTER — Other Ambulatory Visit: Payer: Self-pay | Admitting: Hematology & Oncology

## 2024-04-13 ENCOUNTER — Other Ambulatory Visit: Payer: Self-pay | Admitting: *Deleted

## 2024-04-13 ENCOUNTER — Ambulatory Visit: Payer: Self-pay | Admitting: Medical Oncology

## 2024-04-13 ENCOUNTER — Encounter: Payer: Self-pay | Admitting: Medical Oncology

## 2024-04-13 ENCOUNTER — Inpatient Hospital Stay: Attending: Hematology & Oncology

## 2024-04-13 ENCOUNTER — Inpatient Hospital Stay: Admitting: Medical Oncology

## 2024-04-13 VITALS — BP 153/97 | HR 67 | Temp 98.2°F | Resp 18 | Ht 74.0 in | Wt 170.0 lb

## 2024-04-13 DIAGNOSIS — D696 Thrombocytopenia, unspecified: Secondary | ICD-10-CM

## 2024-04-13 DIAGNOSIS — Z8719 Personal history of other diseases of the digestive system: Secondary | ICD-10-CM

## 2024-04-13 DIAGNOSIS — D72829 Elevated white blood cell count, unspecified: Secondary | ICD-10-CM

## 2024-04-13 LAB — RETIC PANEL
Immature Retic Fract: 17.1 % — ABNORMAL HIGH (ref 2.3–15.9)
RBC.: 4.44 MIL/uL (ref 4.22–5.81)
Retic Count, Absolute: 71.5 K/uL (ref 19.0–186.0)
Retic Ct Pct: 1.6 % (ref 0.4–3.1)
Reticulocyte Hemoglobin: 34.3 pg

## 2024-04-13 LAB — CMP (CANCER CENTER ONLY)
ALT: 10 U/L (ref 0–44)
AST: 15 U/L (ref 15–41)
Albumin: 4.7 g/dL (ref 3.5–5.0)
Alkaline Phosphatase: 34 U/L — ABNORMAL LOW (ref 38–126)
Anion gap: 14 (ref 5–15)
BUN: 25 mg/dL — ABNORMAL HIGH (ref 8–23)
CO2: 23 mmol/L (ref 22–32)
Calcium: 9.8 mg/dL (ref 8.9–10.3)
Chloride: 108 mmol/L (ref 98–111)
Creatinine: 1.67 mg/dL — ABNORMAL HIGH (ref 0.61–1.24)
GFR, Estimated: 39 mL/min — ABNORMAL LOW (ref >60)
Glucose, Bld: 143 mg/dL — ABNORMAL HIGH (ref 70–99)
Potassium: 4.6 mmol/L (ref 3.5–5.1)
Sodium: 144 mmol/L (ref 135–145)
Total Bilirubin: 0.6 mg/dL (ref 0.0–1.2)
Total Protein: 7.2 g/dL (ref 6.5–8.1)

## 2024-04-13 LAB — CBC WITH DIFFERENTIAL (CANCER CENTER ONLY)
Abs Immature Granulocytes: 0.39 K/uL — ABNORMAL HIGH (ref 0.00–0.07)
Basophils Absolute: 0.1 K/uL (ref 0.0–0.1)
Basophils Relative: 0 %
Eosinophils Absolute: 0 K/uL (ref 0.0–0.5)
Eosinophils Relative: 0 %
HCT: 44.1 % (ref 39.0–52.0)
Hemoglobin: 13.8 g/dL (ref 13.0–17.0)
Immature Granulocytes: 3 %
Lymphocytes Relative: 13 %
Lymphs Abs: 1.9 K/uL (ref 0.7–4.0)
MCH: 30.9 pg (ref 26.0–34.0)
MCHC: 31.3 g/dL (ref 30.0–36.0)
MCV: 98.9 fL (ref 80.0–100.0)
Monocytes Absolute: 2.6 K/uL — ABNORMAL HIGH (ref 0.1–1.0)
Monocytes Relative: 18 %
Neutro Abs: 9.4 K/uL — ABNORMAL HIGH (ref 1.7–7.7)
Neutrophils Relative %: 66 %
Platelet Count: 47 K/uL — ABNORMAL LOW (ref 150–400)
RBC: 4.46 MIL/uL (ref 4.22–5.81)
RDW: 16.2 % — ABNORMAL HIGH (ref 11.5–15.5)
WBC Count: 14.4 K/uL — ABNORMAL HIGH (ref 4.0–10.5)
nRBC: 0 % (ref 0.0–0.2)

## 2024-04-13 LAB — FERRITIN: Ferritin: 29 ng/mL (ref 24–336)

## 2024-04-13 LAB — VITAMIN B12: Vitamin B-12: 666 pg/mL (ref 180–914)

## 2024-04-13 LAB — IRON AND IRON BINDING CAPACITY (CC-WL,HP ONLY)
Iron: 104 ug/dL (ref 45–182)
Saturation Ratios: 25 % (ref 17.9–39.5)
TIBC: 410 ug/dL (ref 250–450)
UIBC: 306 ug/dL

## 2024-04-13 LAB — LACTATE DEHYDROGENASE: LDH: 173 U/L (ref 105–235)

## 2024-04-13 LAB — FOLATE: Folate: >20 ng/mL (ref >5.9)

## 2024-04-13 MED ORDER — PREDNISONE 5 MG PO TABS: 5.0000 mg | ORAL_TABLET | Freq: Every morning | ORAL | 2 refills | Status: AC

## 2024-04-13 NOTE — Progress Notes (Signed)
 Hematology and Oncology Follow Up Visit  Joseph Hanna [id number] 03-17-1933 89 y.o. 04/13/2024   Principle Diagnosis:  Thrombocytopenia secondary to underlying liver disease -previously on Imuran  (for UC)  Current Therapy:   Observation Prednisone  5 mg once daily    Interim History:  Joseph Hanna is here today for follow-up.  He is here today with his eldest daughter.   He continues to work with orthopedics regarding chronic back and hip pain. They continue to try to manage his pain. He is taking tylenol  based pain medication products. In the past he has tried to avoid narcotics.   There has been no bleeding to his knowledge: denies epistaxis, gingivitis, hemoptysis, hematemesis, hematuria, melena, excessive bruising, blood donation.   No recent flareup of his ulcerative colitis.  Has been off of the Imuran . This was stopped due to his age at the recommendation of his new UC provider. Symptoms are ok off of this medication.    He continues to take his prednisone  5 mg once daily with breakfast- this was added after his last visit here with us . He is tolerating it well.   He is not having any chest pain or shortness of breath. No fevers or unintentional weight loss.   Weight and appetite are stable.   Overall, I would have said that his performance status is probably ECOG 2.   Wt Readings from Last 3 Encounters:  04/13/24 170 lb (77.1 kg)  02/17/24 170 lb (77.1 kg)  12/23/23 171 lb 1.2 oz (77.6 kg)   Medications:  Allergies as of 04/13/2024       Reactions   Itraconazole Rash   Other Rash   Atorvastatin Other (See Comments)   Other reaction(s): muscle and joint aches Other Reaction(s): muscle aches   Beta Adrenergic Blockers Other (See Comments)   Patient doesn't know reaction   Irbesartan Other (See Comments)   Patient doesn't know reaction    Terbinafine Rash        Medication List        Accurate as of April 13, 2024 10:26 AM. If you have any questions, ask  your nurse or doctor.          Calcium  Carb-Cholecalciferol  600-10 MG-MCG Tabs Take by mouth.   Cholecalciferol  50 MCG (2000 UT) Tabs Take by mouth.   clotrimazole 1 % external solution Commonly known as: LOTRIMIN Apply topically.   CoQ10 200 MG Caps Take 1 capsule by mouth daily.   cycloSPORINE 0.05 % ophthalmic emulsion Commonly known as: RESTASIS Apply to eye.   dorzolamide  2 % ophthalmic solution Commonly known as: TRUSOPT  Apply to eye.   doxycycline  100 MG capsule Commonly known as: VIBRAMYCIN  Take 1 capsule (100 mg total) by mouth 2 (two) times daily.   finasteride  5 MG tablet Commonly known as: PROSCAR  Take 5 mg by mouth daily.   fluoruracil 0.5 % cream Commonly known as: CARAC  Apply 1 Application topically daily.   Ginkgo Biloba 40 MG Tabs Take 120 mg by mouth daily.   latanoprost  0.005 % ophthalmic solution Commonly known as: XALATAN  Place 1 drop into the right eye at bedtime.   levothyroxine  50 MCG tablet Commonly known as: SYNTHROID  Take 50-100 mcg by mouth daily before breakfast. Take 50mcg T, Th, S,and S.  Takes 100 mcg M,W,and F.   Apriso  0.375 g 24 hr capsule Generic drug: mesalamine  4 caps orally once a day   mesalamine  1.2 g EC tablet Commonly known as: LIALDA  Take by mouth.   mesalamine  1000  MG suppository Commonly known as: CANASA  Place 1,000 mg rectally at bedtime.   Metrogel  1 % gel Generic drug: metroNIDAZOLE  1 application a thin layer to affected area Externally prn rosacea   MULTIVITAMIN ADULT PO SMARTSIG:1 By Mouth   Netarsudil-Latanoprost  0.02-0.005 % Soln Apply 1 drop to eye at bedtime. Right eye.   niacin  500 MG tablet Commonly known as: (VITAMIN B3) Take 500 mg by mouth at bedtime.   predniSONE  5 MG tablet Commonly known as: DELTASONE  Take 1 tablet (5 mg total) by mouth every morning.   pregabalin  75 MG capsule Commonly known as: LYRICA  Take 75 mg by mouth 2 (two) times daily.   simvastatin  20 MG  tablet Commonly known as: ZOCOR  Take 10 mg by mouth daily. TAKES QHS        Allergies:  Allergies  Allergen Reactions   Itraconazole Rash   Other Rash   Atorvastatin Other (See Comments)    Other reaction(s): muscle and joint aches  Other Reaction(s): muscle aches   Beta Adrenergic Blockers Other (See Comments)    Patient doesn't know reaction   Irbesartan Other (See Comments)    Patient doesn't know reaction    Terbinafine Rash    Past Medical History, Surgical history, Social history, and Family History were reviewed and updated.  Review of Systems: Review of Systems  Constitutional: Negative.   HENT: Negative.    Eyes: Negative.   Respiratory: Negative.    Cardiovascular: Negative.   Gastrointestinal: Negative.   Genitourinary: Negative.   Musculoskeletal:  Negative for back pain.  Skin: Negative.   Neurological: Negative.   Endo/Heme/Allergies:  Bruises/bleeds easily.  Psychiatric/Behavioral: Negative.      Physical Exam:  height is 6' 2 (1.88 m) and weight is 170 lb (77.1 kg). His oral temperature is 98.2 F (36.8 C). His blood pressure is 153/97 (abnormal) and his pulse is 67. His respiration is 18 and oxygen saturation is 100%.   Wt Readings from Last 3 Encounters:  04/13/24 170 lb (77.1 kg)  02/17/24 170 lb (77.1 kg)  12/23/23 171 lb 1.2 oz (77.6 kg)    Physical Exam Vitals reviewed.  HENT:     Head: Normocephalic and atraumatic.  Eyes:     Pupils: Pupils are equal, round, and reactive to light.  Pulmonary:     Effort: Pulmonary effort is normal. No respiratory distress.  Musculoskeletal:        General: No deformity. Normal range of motion.  Lymphadenopathy:     Cervical: No cervical adenopathy.  Skin:    General: Skin is warm and dry.     Findings: Bruising present. No erythema or rash.  Neurological:     Mental Status: He is alert and oriented to person, place, and time.  Psychiatric:        Behavior: Behavior normal.        Thought  Content: Thought content normal.        Judgment: Judgment normal.    Lab Results  Component Value Date   WBC 10.7 (H) 10/07/2023   HGB 13.8 10/07/2023   HCT 44.5 10/07/2023   MCV 99.8 10/07/2023   PLT 41 (L) 10/07/2023   Lab Results  Component Value Date   FERRITIN 51 10/07/2023   IRON 108 10/07/2023   TIBC 377 10/07/2023   UIBC 269 10/07/2023   IRONPCTSAT 29 10/07/2023   Lab Results  Component Value Date   RETICCTPCT 1.6 04/13/2024   RBC 4.44 04/13/2024   No results found for: KPAFRELGTCHN,  LAMBDASER, KAPLAMBRATIO No results found for: IGGSERUM, IGA, IGMSERUM No results found for: STEPHANY CARLOTA BENSON MARKEL EARLA JOANNIE DOC VICK, SPEI   Chemistry      Component Value Date/Time   NA 144 04/13/2024 0922   K 4.6 04/13/2024 0922   CL 108 04/13/2024 0922   CO2 23 04/13/2024 0922   BUN 25 (H) 04/13/2024 0922   CREATININE 1.67 (H) 04/13/2024 0922      Component Value Date/Time   CALCIUM  9.8 04/13/2024 0922   ALKPHOS 34 (L) 04/13/2024 0922   AST 15 04/13/2024 0922   ALT 10 04/13/2024 0922   BILITOT 0.6 04/13/2024 9077     Encounter Diagnoses  Name Primary?   Thrombocytopenia Yes   History of ulcerative colitis    Leukocytosis, unspecified type    Impression and Plan: Joseph Hanna is a very pleasant 89 yo caucasian gentleman with history of thrombocytopenia since at least February 2017. He also has mild leukocytosis suspected to be due to his chronic prednisone  use.   He is currently on observation for thrombocytopenia.  Nutritional labs pending Platelets are stable in the 40s- no bleeding but does have bruising I have suggested a referral to pain management- ideally he would avoid acetaminophen  and NSAIDs due to his thrombocytopenia.   If surgery if needed in the future, Nplate and/or possible steroids could be considered.   Again we discussed red flags in regards to his thrombocytopenia.    I spent 35 minutes  dedicated to the care of this patient (face to face and non-face to face) on the date of this encounter to include the discussions above.    Disposition RTC 6 months APP, labs ( CBC w/, CMP, LDH, smear, ferritin, iron, B12, retic, folate)-[place]   Lauraine CHRISTELLA Dais, PA-C 12/2/202510:26 AM

## 2024-04-15 DIAGNOSIS — M48061 Spinal stenosis, lumbar region without neurogenic claudication: Secondary | ICD-10-CM | POA: Diagnosis not present

## 2024-04-15 DIAGNOSIS — M5416 Radiculopathy, lumbar region: Secondary | ICD-10-CM | POA: Diagnosis not present

## 2024-04-15 DIAGNOSIS — M545 Low back pain, unspecified: Secondary | ICD-10-CM | POA: Diagnosis not present

## 2024-04-20 DIAGNOSIS — L570 Actinic keratosis: Secondary | ICD-10-CM | POA: Diagnosis not present

## 2024-04-20 DIAGNOSIS — L723 Sebaceous cyst: Secondary | ICD-10-CM | POA: Diagnosis not present

## 2024-04-20 DIAGNOSIS — Z85828 Personal history of other malignant neoplasm of skin: Secondary | ICD-10-CM | POA: Diagnosis not present

## 2024-04-20 DIAGNOSIS — L821 Other seborrheic keratosis: Secondary | ICD-10-CM | POA: Diagnosis not present

## 2024-04-20 DIAGNOSIS — D692 Other nonthrombocytopenic purpura: Secondary | ICD-10-CM | POA: Diagnosis not present

## 2024-04-20 DIAGNOSIS — L853 Xerosis cutis: Secondary | ICD-10-CM | POA: Diagnosis not present

## 2024-04-22 DIAGNOSIS — H35033 Hypertensive retinopathy, bilateral: Secondary | ICD-10-CM | POA: Diagnosis not present

## 2024-04-22 DIAGNOSIS — H35372 Puckering of macula, left eye: Secondary | ICD-10-CM | POA: Diagnosis not present

## 2024-04-22 DIAGNOSIS — H401133 Primary open-angle glaucoma, bilateral, severe stage: Secondary | ICD-10-CM | POA: Diagnosis not present

## 2024-04-22 DIAGNOSIS — H16223 Keratoconjunctivitis sicca, not specified as Sjogren's, bilateral: Secondary | ICD-10-CM | POA: Diagnosis not present

## 2024-04-27 ENCOUNTER — Encounter (INDEPENDENT_AMBULATORY_CARE_PROVIDER_SITE_OTHER): Payer: Medicare HMO | Admitting: Ophthalmology

## 2024-05-07 ENCOUNTER — Ambulatory Visit: Payer: No Typology Code available for payment source

## 2024-05-07 DIAGNOSIS — I441 Atrioventricular block, second degree: Secondary | ICD-10-CM

## 2024-05-10 LAB — CUP PACEART REMOTE DEVICE CHECK
Battery Remaining Longevity: 36 mo
Battery Remaining Percentage: 30 %
Battery Voltage: 2.95 V
Brady Statistic AP VP Percent: 54 %
Brady Statistic AP VS Percent: 6.2 %
Brady Statistic AS VP Percent: 24 %
Brady Statistic AS VS Percent: 11 %
Brady Statistic RA Percent Paced: 52 %
Brady Statistic RV Percent Paced: 78 %
Date Time Interrogation Session: 20251227235238
Implantable Lead Connection Status: 753985
Implantable Lead Connection Status: 753985
Implantable Lead Implant Date: 20191226
Implantable Lead Implant Date: 20191226
Implantable Lead Location: 753859
Implantable Lead Location: 753860
Implantable Pulse Generator Implant Date: 20191226
Lead Channel Impedance Value: 490 Ohm
Lead Channel Impedance Value: 530 Ohm
Lead Channel Pacing Threshold Amplitude: 0.375 V
Lead Channel Pacing Threshold Amplitude: 0.5 V
Lead Channel Pacing Threshold Pulse Width: 0.5 ms
Lead Channel Pacing Threshold Pulse Width: 0.5 ms
Lead Channel Sensing Intrinsic Amplitude: 12 mV
Lead Channel Sensing Intrinsic Amplitude: 3.1 mV
Lead Channel Setting Pacing Amplitude: 0.75 V
Lead Channel Setting Pacing Amplitude: 1.375
Lead Channel Setting Pacing Pulse Width: 0.5 ms
Lead Channel Setting Sensing Sensitivity: 0.5 mV
Pulse Gen Model: 2272
Pulse Gen Serial Number: 9094310

## 2024-05-11 ENCOUNTER — Ambulatory Visit: Payer: Self-pay | Admitting: Cardiology

## 2024-05-12 NOTE — Progress Notes (Signed)
 Remote PPM Transmission

## 2024-05-27 ENCOUNTER — Telehealth: Payer: Self-pay | Admitting: Cardiovascular Disease

## 2024-05-27 NOTE — Telephone Encounter (Signed)
 Patient had a skin biopsy today at the [facility]. During the procedure, his heart rate was noted in the 40s (ranging 40 - 46 on repeated measurements). Patient denies any associated symptoms. Blood pressure was 144/58 at the start of the visit and 140/46 at the end. Patients pacemaker is set at 62.  Daughter reports that his heart rate has been progressively lower, which is concerning to her. She notes that he typically does not experience symptoms when the heart rate is low and is questioning whether his pacemaker is working.   Reviewed emergency department protocol with family. Will contact EP for further guidance.

## 2024-05-27 NOTE — Telephone Encounter (Signed)
 Spoke with pt's daughter Amy after receiving verbal permission from pt.  Pt's daughter states pt had a biopsy this morning at the [facility].  Pt's heart rate checked at the [facility] was in the 40's.  Pt was asymptomatic.  Requested pt send a device transmission for review. Will have device clinic review transmission.   Pt's daughter verbalizes understanding and is agreeable.

## 2024-05-27 NOTE — Telephone Encounter (Signed)
 Spoke with pt's daughter Amy and advised of rational for lower heart rate obtained by [facility] most likely due to frequent palpitations.  Reassured patient's daughter pacemaker is functioning normally and shows patient's heart rate has not been below 60.  Pt is due for follow up and will have scheduler contact pt to schedule appointment with Dr Inocencio or APP.  Pt's daughter verbalizes understanding and thanked [employer] for the assistance.

## 2024-05-27 NOTE — Telephone Encounter (Signed)
 STAT if HR is under 50 or over 120 (normal HR is 60-100 beats per minute)  What is your heart rate?   Daughter stated patient's HR has been in the low 40's  Do you have a log of your heart rate readings (document readings)?   HR 40, 42, 44, 46  Do you have any other symptoms?  No  Daughter (Amy) stated patient went for surgery appointment at the [facility] today and patient's HR has been in the 40's over the course of 1 hour.

## 2024-05-27 NOTE — Telephone Encounter (Signed)
 Manual transmission received and reviewed w/ [employer]. Patient presenting AS/VP w/ frequent ectopy. Patient presenting w/ both PAC's & PVC's. See transmission EGMs below.

## 2024-06-02 ENCOUNTER — Telehealth: Payer: Self-pay

## 2024-06-02 NOTE — Telephone Encounter (Signed)
 RN returned call to discuss pain management as requested. Unable to reach anyone and no VM to leave a message requesting a call back.

## 2024-06-03 ENCOUNTER — Other Ambulatory Visit: Payer: Self-pay

## 2024-06-03 DIAGNOSIS — G894 Chronic pain syndrome: Secondary | ICD-10-CM

## 2024-06-03 MED ORDER — TRAMADOL HCL ER 100 MG PO TB24: 100.0000 mg | ORAL_TABLET | Freq: Every day | ORAL | 0 refills | Status: AC

## 2024-06-03 NOTE — Telephone Encounter (Signed)
 Patient's daughter requesting refill of Tramadol  ER 100mg  Daily for her father per previous discussion with Lauraine Dais, PA-C. Reviewed with Dr. Timmy who advised we could refill this prescription and recommend a pain management provider moving forward. RN placed call to patient's daughter with update.

## 2024-06-08 ENCOUNTER — Other Ambulatory Visit: Payer: Self-pay

## 2024-06-08 DIAGNOSIS — G894 Chronic pain syndrome: Secondary | ICD-10-CM

## 2024-06-08 NOTE — Progress Notes (Signed)
 Patient's daughter (Amy) requesting referral for pain management per previous conversation with Lauraine Dais, PA-C. Referral order placed to [facility] Dr. Marcelino per patient request. RN attempted to reach patient's daughter to discuss but unable to reach Amy. Unable to leave VM as mailbox is full.

## 2024-08-06 ENCOUNTER — Ambulatory Visit

## 2024-10-12 ENCOUNTER — Inpatient Hospital Stay

## 2024-10-12 ENCOUNTER — Inpatient Hospital Stay: Admitting: Medical Oncology

## 2024-10-27 ENCOUNTER — Encounter (INDEPENDENT_AMBULATORY_CARE_PROVIDER_SITE_OTHER): Admitting: Ophthalmology

## 2024-11-05 ENCOUNTER — Ambulatory Visit

## 2025-02-01 ENCOUNTER — Encounter (INDEPENDENT_AMBULATORY_CARE_PROVIDER_SITE_OTHER): Admitting: Ophthalmology

## 2025-02-04 ENCOUNTER — Ambulatory Visit
